# Patient Record
Sex: Female | Born: 1970 | Race: White | Hispanic: No | Marital: Married | Smoking: Former smoker
Health system: Southern US, Community
[De-identification: ages and names within clinical notes are randomized; demographics above are authoritative.]

## PROBLEM LIST (undated history)

## (undated) DIAGNOSIS — E669 Obesity, unspecified: Secondary | ICD-10-CM

## (undated) DIAGNOSIS — D219 Benign neoplasm of connective and other soft tissue, unspecified: Secondary | ICD-10-CM

## (undated) DIAGNOSIS — M1990 Unspecified osteoarthritis, unspecified site: Secondary | ICD-10-CM

## (undated) DIAGNOSIS — R0602 Shortness of breath: Secondary | ICD-10-CM

## (undated) DIAGNOSIS — K219 Gastro-esophageal reflux disease without esophagitis: Secondary | ICD-10-CM

## (undated) DIAGNOSIS — E039 Hypothyroidism, unspecified: Secondary | ICD-10-CM

## (undated) DIAGNOSIS — G4730 Sleep apnea, unspecified: Secondary | ICD-10-CM

## (undated) DIAGNOSIS — Z8489 Family history of other specified conditions: Secondary | ICD-10-CM

## (undated) DIAGNOSIS — I10 Essential (primary) hypertension: Secondary | ICD-10-CM

## (undated) DIAGNOSIS — F419 Anxiety disorder, unspecified: Secondary | ICD-10-CM

## (undated) DIAGNOSIS — G5600 Carpal tunnel syndrome, unspecified upper limb: Secondary | ICD-10-CM

## (undated) HISTORY — DX: Benign neoplasm of connective and other soft tissue, unspecified: D21.9

## (undated) HISTORY — PX: UPPER GASTROINTESTINAL ENDOSCOPY: SHX188

## (undated) HISTORY — PX: BREAST REDUCTION SURGERY: SHX8

## (undated) HISTORY — PX: COLONOSCOPY: SHX174

## (undated) HISTORY — PX: WISDOM TOOTH EXTRACTION: SHX21

## (undated) HISTORY — DX: Obesity, unspecified: E66.9

## (undated) HISTORY — PX: OVARIAN CYST REMOVAL: SHX89

---

## 1996-08-12 HISTORY — PX: REDUCTION MAMMAPLASTY: SUR839

## 2001-09-16 ENCOUNTER — Other Ambulatory Visit: Admission: RE | Admit: 2001-09-16 | Discharge: 2001-09-16 | Payer: Self-pay | Admitting: Obstetrics & Gynecology

## 2003-12-13 ENCOUNTER — Other Ambulatory Visit: Admission: RE | Admit: 2003-12-13 | Discharge: 2003-12-13 | Payer: Self-pay | Admitting: Obstetrics & Gynecology

## 2004-01-13 ENCOUNTER — Ambulatory Visit (HOSPITAL_COMMUNITY): Admission: RE | Admit: 2004-01-13 | Discharge: 2004-01-13 | Payer: Self-pay | Admitting: Obstetrics & Gynecology

## 2004-12-05 ENCOUNTER — Ambulatory Visit (HOSPITAL_COMMUNITY): Admission: RE | Admit: 2004-12-05 | Discharge: 2004-12-05 | Payer: Self-pay | Admitting: Obstetrics & Gynecology

## 2005-08-08 ENCOUNTER — Other Ambulatory Visit: Admission: RE | Admit: 2005-08-08 | Discharge: 2005-08-08 | Payer: Self-pay | Admitting: Obstetrics & Gynecology

## 2012-03-24 ENCOUNTER — Other Ambulatory Visit: Payer: Self-pay | Admitting: Orthopedic Surgery

## 2012-04-10 ENCOUNTER — Encounter (HOSPITAL_BASED_OUTPATIENT_CLINIC_OR_DEPARTMENT_OTHER): Payer: Self-pay | Admitting: *Deleted

## 2012-04-10 NOTE — Progress Notes (Signed)
Pt is obese--has sleep apnea-uses a c pap every night -to bring dos Has been having sob-tired all the time-seeing a cardiologist the day prior to her surgery for echo Will call for recent ekg and cxr- Will review with anesth If ok-will need [employer]

## 2012-04-10 NOTE — Progress Notes (Signed)
Called [facility] to have echo being done 04/14/12 to be sent asap-if cannot get-dr ossey said surgery may need to be r/s-alishia notified

## 2012-04-15 ENCOUNTER — Encounter (HOSPITAL_BASED_OUTPATIENT_CLINIC_OR_DEPARTMENT_OTHER): Payer: Self-pay | Admitting: *Deleted

## 2012-04-15 ENCOUNTER — Ambulatory Visit (HOSPITAL_BASED_OUTPATIENT_CLINIC_OR_DEPARTMENT_OTHER)
Admission: RE | Admit: 2012-04-15 | Discharge: 2012-04-15 | Disposition: A | Discharge status: Home / Self Care | Payer: BC Managed Care – HMO | Source: Ambulatory Visit | Attending: Orthopedic Surgery | Admitting: Orthopedic Surgery

## 2012-04-15 ENCOUNTER — Encounter (HOSPITAL_BASED_OUTPATIENT_CLINIC_OR_DEPARTMENT_OTHER): Payer: Self-pay | Admitting: Anesthesiology

## 2012-04-15 ENCOUNTER — Encounter (HOSPITAL_BASED_OUTPATIENT_CLINIC_OR_DEPARTMENT_OTHER): Payer: Self-pay | Admitting: Orthopedic Surgery

## 2012-04-15 ENCOUNTER — Ambulatory Visit (HOSPITAL_BASED_OUTPATIENT_CLINIC_OR_DEPARTMENT_OTHER): Payer: BC Managed Care – HMO | Admitting: Anesthesiology

## 2012-04-15 ENCOUNTER — Encounter (HOSPITAL_BASED_OUTPATIENT_CLINIC_OR_DEPARTMENT_OTHER)
Admission: RE | Disposition: A | Discharge status: Home / Self Care | Payer: Self-pay | Source: Ambulatory Visit | Attending: Orthopedic Surgery

## 2012-04-15 DIAGNOSIS — G4730 Sleep apnea, unspecified: Secondary | ICD-10-CM | POA: Insufficient documentation

## 2012-04-15 DIAGNOSIS — K219 Gastro-esophageal reflux disease without esophagitis: Secondary | ICD-10-CM | POA: Insufficient documentation

## 2012-04-15 DIAGNOSIS — I10 Essential (primary) hypertension: Secondary | ICD-10-CM | POA: Insufficient documentation

## 2012-04-15 DIAGNOSIS — G5600 Carpal tunnel syndrome, unspecified upper limb: Secondary | ICD-10-CM | POA: Insufficient documentation

## 2012-04-15 HISTORY — DX: Gastro-esophageal reflux disease without esophagitis: K21.9

## 2012-04-15 HISTORY — DX: Family history of other specified conditions: Z84.89

## 2012-04-15 HISTORY — PX: CARPAL TUNNEL RELEASE: SHX101

## 2012-04-15 HISTORY — DX: Sleep apnea, unspecified: G47.30

## 2012-04-15 HISTORY — DX: Carpal tunnel syndrome, unspecified upper limb: G56.00

## 2012-04-15 HISTORY — DX: Essential (primary) hypertension: I10

## 2012-04-15 HISTORY — DX: Shortness of breath: R06.02

## 2012-04-15 LAB — POCT I-STAT, CHEM 8
Calcium, Ion: 1.2 mmol/L (ref 1.12–1.23)
Glucose, Bld: 119 mg/dL — ABNORMAL HIGH (ref 70–99)
HCT: 42 % (ref 36.0–46.0)
Hemoglobin: 14.3 g/dL (ref 12.0–15.0)
Potassium: 5.2 mEq/L — ABNORMAL HIGH (ref 3.5–5.1)

## 2012-04-15 SURGERY — CARPAL TUNNEL RELEASE
Anesthesia: Regional | Site: Wrist | Laterality: Right | Wound class: Clean

## 2012-04-15 SURGERY — Surgical Case
Anesthesia: *Unknown

## 2012-04-15 MED ORDER — FENTANYL CITRATE 0.05 MG/ML IJ SOLN: 25.0000 ug | INTRAMUSCULAR | Status: DC | PRN

## 2012-04-15 MED ORDER — PROPOFOL 10 MG/ML IV EMUL
INTRAVENOUS | Status: DC | PRN
  Administered 2012-04-15: 75 ug/kg/min via INTRAVENOUS

## 2012-04-15 MED ORDER — MIDAZOLAM HCL 5 MG/5ML IJ SOLN
INTRAMUSCULAR | Status: DC | PRN
  Administered 2012-04-15: 0.5 mg via INTRAVENOUS
  Administered 2012-04-15: 1 mg via INTRAVENOUS

## 2012-04-15 MED ORDER — ONDANSETRON HCL 4 MG/2ML IJ SOLN
INTRAMUSCULAR | Status: DC | PRN
  Administered 2012-04-15: 4 mg via INTRAVENOUS

## 2012-04-15 MED ORDER — FENTANYL CITRATE 0.05 MG/ML IJ SOLN
INTRAMUSCULAR | Status: DC | PRN
  Administered 2012-04-15: 25 ug via INTRAVENOUS

## 2012-04-15 MED ORDER — DEXTROSE 5 % IV SOLN
3.0000 g | INTRAVENOUS | Status: AC
  Administered 2012-04-15: 2 g via INTRAVENOUS

## 2012-04-15 MED ORDER — HYDROCODONE-ACETAMINOPHEN 5-500 MG PO TABS: 1.0000 | ORAL_TABLET | ORAL | Status: AC | PRN

## 2012-04-15 MED ORDER — LIDOCAINE HCL (CARDIAC) 20 MG/ML IV SOLN
INTRAVENOUS | Status: DC | PRN
  Administered 2012-04-15: 30 mg via INTRAVENOUS

## 2012-04-15 MED ORDER — METOCLOPRAMIDE HCL 5 MG/ML IJ SOLN: 10.0000 mg | Freq: Once | INTRAMUSCULAR | Status: DC | PRN

## 2012-04-15 MED ORDER — LACTATED RINGERS IV SOLN
INTRAVENOUS | Status: DC
  Administered 2012-04-15: 10:00:00 via INTRAVENOUS

## 2012-04-15 MED ORDER — CHLORHEXIDINE GLUCONATE 4 % EX LIQD: 60.0000 mL | Freq: Once | CUTANEOUS | Status: DC

## 2012-04-15 MED ORDER — OXYCODONE HCL 5 MG/5ML PO SOLN: 5.0000 mg | Freq: Once | ORAL | Status: DC | PRN

## 2012-04-15 MED ORDER — OXYCODONE HCL 5 MG PO TABS: 5.0000 mg | ORAL_TABLET | Freq: Once | ORAL | Status: DC | PRN

## 2012-04-15 MED ORDER — BUPIVACAINE HCL (PF) 0.25 % IJ SOLN
INTRAMUSCULAR | Status: DC | PRN
  Administered 2012-04-15: 8 mL

## 2012-04-15 SURGICAL SUPPLY — 34 items
BANDAGE GAUZE ELAST BULKY 4 IN (GAUZE/BANDAGES/DRESSINGS) ×2 IMPLANT
BLADE SURG 15 STRL LF DISP TIS (BLADE) ×1 IMPLANT
BLADE SURG 15 STRL SS (BLADE) ×1
BNDG COHESIVE 3X5 TAN STRL LF (GAUZE/BANDAGES/DRESSINGS) ×2 IMPLANT
BNDG ESMARK 4X9 LF (GAUZE/BANDAGES/DRESSINGS) IMPLANT
CHLORAPREP W/TINT 26ML (MISCELLANEOUS) ×2 IMPLANT
CLOTH BEACON ORANGE TIMEOUT ST (SAFETY) ×2 IMPLANT
CORDS BIPOLAR (ELECTRODE) ×2 IMPLANT
COVER MAYO STAND STRL (DRAPES) ×2 IMPLANT
COVER TABLE BACK 60X90 (DRAPES) ×2 IMPLANT
CUFF TOURNIQUET SINGLE 18IN (TOURNIQUET CUFF) ×2 IMPLANT
DRAPE EXTREMITY T 121X128X90 (DRAPE) ×2 IMPLANT
DRAPE SURG 17X23 STRL (DRAPES) ×2 IMPLANT
DRSG KUZMA FLUFF (GAUZE/BANDAGES/DRESSINGS) ×2 IMPLANT
GAUZE XEROFORM 1X8 LF (GAUZE/BANDAGES/DRESSINGS) ×2 IMPLANT
GLOVE BIO SURGEON STRL SZ 6.5 (GLOVE) ×2 IMPLANT
GLOVE SURG ORTHO 8.0 STRL STRW (GLOVE) ×2 IMPLANT
GOWN BRE IMP PREV XXLGXLNG (GOWN DISPOSABLE) ×2 IMPLANT
GOWN PREVENTION PLUS XLARGE (GOWN DISPOSABLE) ×2 IMPLANT
NEEDLE 27GAX1X1/2 (NEEDLE) ×2 IMPLANT
NS IRRIG 1000ML POUR BTL (IV SOLUTION) ×2 IMPLANT
PACK BASIN DAY SURGERY FS (CUSTOM PROCEDURE TRAY) ×2 IMPLANT
PAD CAST 3X4 CTTN HI CHSV (CAST SUPPLIES) ×1 IMPLANT
PADDING CAST ABS 4INX4YD NS (CAST SUPPLIES)
PADDING CAST ABS COTTON 4X4 ST (CAST SUPPLIES) IMPLANT
PADDING CAST COTTON 3X4 STRL (CAST SUPPLIES) ×1
SPONGE GAUZE 4X4 12PLY (GAUZE/BANDAGES/DRESSINGS) ×2 IMPLANT
STOCKINETTE 4X48 STRL (DRAPES) ×2 IMPLANT
SUT VICRYL 4-0 PS2 18IN ABS (SUTURE) IMPLANT
SUT VICRYL RAPIDE 4/0 PS 2 (SUTURE) ×2 IMPLANT
SYR BULB 3OZ (MISCELLANEOUS) ×2 IMPLANT
SYR CONTROL 10ML LL (SYRINGE) ×2 IMPLANT
TOWEL OR 17X24 6PK STRL BLUE (TOWEL DISPOSABLE) ×2 IMPLANT
UNDERPAD 30X30 INCONTINENT (UNDERPADS AND DIAPERS) ×2 IMPLANT

## 2012-04-15 NOTE — H&P (Signed)
Jenna Pope is a 41 year-old right-hand dominant female who comes in complaining of pain in both hands, tenderness to the elbows, medial side with numbness and tingling to the thumb and index fingers.  She has no history of injury.  She is awakened three out of seven nights.  This has been going on for several years.  She states that she does a lot of typing.  She complains of a constant, moderate to severe, throbbing aching pain with a feeling of swelling, numbness and weakness. She states it is gradually getting worse. She has been using Advil and Aleve and wearing braces at night. She has no history of diabetes, thyroid problems, arthritis or gout. There is no history of injury to her hands or neck.  She states that right is equivalent to the left side.  She has had her nerve conductions done by Dr. Johna Roles revealing bilateral carpal tunnel syndrome with a motor delay of 4.7 on the left and 5.0 on the right, sensory delay of 3.1 bilaterally  ALLERGIES:   None.  MEDICATIONS:    Losartan, Protonix, Xanax.  SURGICAL HISTORY:    Ablation surgery in 2007, breast reduction in '98.  FAMILY MEDICAL HISTORY:    Positive for diabetes, heart disease, high blood pressure and arthritis.  SOCIAL HISTORY:    She smokes a pack a day and is advised to quit with the reasons behind this. She does not drink. She is married and works as a Research officer, trade union at [employer].  REVIEW OF SYSTEMS:    Positive for high blood pressure, shortness of breath, rash, sleep disorder secondary to sleep apnea otherwise negative 14 points. Jenna Pope is an 41 y.o. female.   Chief Complaint: CTS RT  HPI: see above  Past Medical History  Diagnosis Date  . Hypertension   . GERD (gastroesophageal reflux disease)   . Carpal tunnel syndrome   . Shortness of breath   . Sleep apnea     uses cpap since 2/13  . Family history of anesthesia complication     had an aunt cardiac arrest during a knee surgery    Past Surgical History    Procedure Date  . Upper gastrointestinal endoscopy   . Wisdom tooth extraction   . Ovarian cyst removal   . Breast reduction surgery     No family history on file. Social History:  reports that she has been smoking.  She does not have any smokeless tobacco history on file. She reports that she drinks alcohol. She reports that she does not use illicit drugs.  Allergies:  Allergies  Allergen Reactions  . Ace Inhibitors     cough    Medications Prior to Admission  Medication Sig Dispense Refill  . losartan-hydrochlorothiazide (HYZAAR) 50-12.5 MG per tablet Take 1 tablet by mouth daily.      . pantoprazole (PROTONIX) 40 MG tablet Take 40 mg by mouth daily.      Marland [employer] Specialty Vitamins Products (MAGNESIUM, AMINO ACID CHELATE,) 133 MG tablet Take 1 tablet by mouth 2 (two) times daily.        No results found for this or any previous visit (from the past 48 hour(s)).  No results found.   Pertinent items are noted in HPI.  Blood pressure 134/82, pulse 102, temperature 98.5 F (36.9 C), temperature source Oral, resp. rate 20, height 5' 2.5" (1.588 m), weight 258 lb (117.028 kg), SpO2 99.00%.  General appearance: alert, cooperative and appears stated age Head: Normocephalic, without obvious  abnormality Neck: no adenopathy Resp: clear to auscultation bilaterally Cardio: regular rate and rhythm, S1, S2 normal, no murmur, click, rub or gallop GI: soft, non-tender; bowel sounds normal; no masses,  no organomegaly Extremities: extremities normal, atraumatic, no cyanosis or edema Pulses:  L brachial 2+ R brachial 2+  L radial 2+ R radial 2+  L inguinal 2+ R inguinal 2+  L popliteal 2+ R popliteal 2+  L posterior tibial 2+ R posterior tibial 2+  L dorsalis pedis 2+ R dorsalis pedis 2+   Skin: Skin color, texture, turgor normal. No rashes or lesions Neurologic: Grossly normal Incision/Wound: na  Assessment/Plan CTS Rt  The pre, peri and postoperative course were discussed along  with the risks and complications.  The patient is aware there is no guarantee with the surgery, possibility of infection, recurrence, injury to arteries, nerves, tendons, incomplete relief of symptoms and dystrophy.  She would like to do the right side for release.  Nethan Caudillo R 04/15/2012, 10:22 AM

## 2012-04-15 NOTE — Transfer of Care (Signed)
Immediate Anesthesia Transfer of Care Note  Patient: Jenna Pope  Procedure(s) Performed: Procedure(s) (LRB) with comments: CARPAL TUNNEL RELEASE (Right)  Patient Location: PACU  Anesthesia Type: Bier block  Level of Consciousness: awake and alert   Airway & Oxygen Therapy: Patient Spontanous Breathing  Post-op Assessment: Report given to PACU RN and Post -op Vital signs reviewed and stable  Post vital signs: Reviewed and stable  Complications: No apparent anesthesia complications

## 2012-04-15 NOTE — Anesthesia Preprocedure Evaluation (Signed)
Anesthesia Evaluation  Patient identified by MRN, date of birth, ID band Patient awake    Reviewed: Allergy & Precautions, H&P , NPO status , Patient's Chart, lab work & pertinent test results, reviewed documented beta blocker date and time   Airway Mallampati: II TM Distance: >3 FB Neck ROM: full    Dental   Pulmonary shortness of breath and with exertion, sleep apnea ,  breath sounds clear to auscultation        Cardiovascular hypertension, On Medications Rhythm:regular     Neuro/Psych  Neuromuscular disease negative neurological ROS  negative psych ROS   GI/Hepatic negative GI ROS, Neg liver ROS, GERD-  Medicated and Controlled,  Endo/Other  negative endocrine ROS  Renal/GU negative Renal ROS  negative genitourinary   Musculoskeletal   Abdominal   Peds  Hematology negative hematology ROS (+)   Anesthesia Other Findings See surgeon's H&P   Reproductive/Obstetrics negative OB ROS                           Anesthesia Physical Anesthesia Plan  ASA: III  Anesthesia Plan: MAC and Bier Block   Post-op Pain Management:    Induction: Intravenous  Airway Management Planned: Simple Face Mask  Additional Equipment:   Intra-op Plan:   Post-operative Plan:   Informed Consent: I have reviewed the patients History and Physical, chart, labs and discussed the procedure including the risks, benefits and alternatives for the proposed anesthesia with the patient or authorized representative who has indicated his/her understanding and acceptance.   Dental Advisory Given  Plan Discussed with: CRNA and Surgeon  Anesthesia Plan Comments:         Anesthesia Quick Evaluation

## 2012-04-15 NOTE — Op Note (Signed)
Dictated number: [id number]

## 2012-04-15 NOTE — Anesthesia Postprocedure Evaluation (Signed)
Anesthesia Post Note  Patient: Jenna Pope  Procedure(s) Performed: Procedure(s) (LRB): CARPAL TUNNEL RELEASE (Right)  Anesthesia type: MAC  Patient location: PACU  Post pain: Pain level controlled  Post assessment: Patient's Cardiovascular Status Stable  Last Vitals:  Filed Vitals:   04/15/12 1150  BP: 132/89  Pulse: 94  Temp: 36.5 C  Resp: 16    Post vital signs: Reviewed and stable  Level of consciousness: alert  Complications: No apparent anesthesia complications

## 2012-04-15 NOTE — Brief Op Note (Signed)
04/15/2012  11:21 AM  PATIENT:  Coolidge Callas  41 y.o. female  PRE-OPERATIVE DIAGNOSIS:  right cts  POST-OPERATIVE DIAGNOSIS:  right cts  PROCEDURE:  Procedure(s) (LRB) with comments: CARPAL TUNNEL RELEASE (Right)  SURGEON:  Surgeon(s) and Role:    * Nicki Reaper, MD - Primary  PHYSICIAN ASSISTANT:   ASSISTANTS: none   ANESTHESIA:   local and regional  EBL:  Total I/O In: 700 [I.V.:700] Out: -   BLOOD ADMINISTERED:none  DRAINS: none   LOCAL MEDICATIONS USED:  MARCAINE     SPECIMEN:  No Specimen  DISPOSITION OF SPECIMEN:  N/A  COUNTS:  YES  TOURNIQUET:   Total Tourniquet Time Documented: Forearm (Right) - 23 minutes  DICTATION: .Other Dictation: Dictation Number [id number]  PLAN OF CARE: Discharge to home after PACU  PATIENT DISPOSITION:  PACU - hemodynamically stable.

## 2012-04-16 ENCOUNTER — Encounter (HOSPITAL_BASED_OUTPATIENT_CLINIC_OR_DEPARTMENT_OTHER): Payer: Self-pay | Admitting: Orthopedic Surgery

## 2012-04-16 NOTE — Op Note (Signed)
NAME:  SIMSStann Ore NO.:  [id number]  MEDICAL RECORD NO.:  [id number]  LOCATION:                                 FACILITY:  PHYSICIAN:  Cindee Salt, M.D.            DATE OF BIRTH:  DATE OF PROCEDURE:  04/15/2012 DATE OF DISCHARGE:                              OPERATIVE REPORT   PREOPERATIVE DIAGNOSIS:  Carpal tunnel syndrome, right hand.  POSTOPERATIVE DIAGNOSIS:  Carpal tunnel syndrome, right hand.  OPERATION:  Decompression of right median nerve.  SURGEON:  Cindee Salt, MD  ANESTHESIA:  Forearm-based IV regional with local infiltration.  ANESTHESIOLOGIST:  Janetta Hora. Gelene Mink, MD  HISTORY:  The patient is a 41 year old female with a history of carpal tunnel syndrome, EMG nerve conduction is positive.  This has not responded to conservative treatment.  She has elected to undergo surgical decompression.  Pre, peri, and postoperative course had been discussed along with risks and complications.  She is aware that there is no guarantee with the surgery; possibility of infection; recurrence of injury to arteries, nerves, tendons; incomplete relief of symptoms and dystrophy.  In the preoperative area, the patient is seen, the extremity marked by both the patient and surgeon, and antibiotic given.  PROCEDURE:  The patient was brought to the operating room.  A forearm- based IV regional anesthetic was carried out without difficulty.  She was prepped using ChloraPrep, supine position with the right arm free. A 3-minute dry time was allowed.  Time-out taken, confirming the patient and procedure.  A longitudinal incision was made in the palm, carried down through the subcutaneous tissue.  Bleeders were electrocauterized. Palmar fascia was split.  Superficial palmar arch was identified. Flexor tendon of the ring and little finger were identified to the ulnar side of the median nerve.  The carpal retinaculum was incised with sharp dissection.  Right angle and  Sewall retractor were placed between the skin and forearm fascia.  The fascia was released for approximately a 1.5 cm proximal to the wrist crease under direct vision.  Canal was explored.  Tenosynovial tissue was markedly thickened.  Air compression to the nerve was apparent.  No further lesions were identified.  The wound was irrigated.  The skin was then closed with interrupted 4-0 Vicryl Rapide sutures.  Local infiltration with 0.25% Marcaine without epinephrine was given, approximately 5 mL was used.  Sterile compressive dressing with fingers free was applied.  On deflation of the tourniquet, all fingers were immediately pinked.  She was taken to the recovery room for observation in satisfactory condition.          ______________________________ Cindee Salt, M.D.     GK/MEDQ  D:  04/15/2012  T:  04/16/2012  Job:  [id number]

## 2013-02-09 LAB — HM MAMMOGRAPHY: HM Mammogram: NORMAL

## 2013-03-15 ENCOUNTER — Encounter: Payer: Self-pay | Admitting: Family Medicine

## 2013-04-20 ENCOUNTER — Encounter: Payer: Self-pay | Admitting: Family Medicine

## 2013-04-20 ENCOUNTER — Ambulatory Visit (INDEPENDENT_AMBULATORY_CARE_PROVIDER_SITE_OTHER): Payer: BC Managed Care – HMO | Admitting: Family Medicine

## 2013-04-20 VITALS — BP 130/82 | HR 102 | Temp 98.7°F | Ht 61.5 in | Wt 260.8 lb

## 2013-04-20 DIAGNOSIS — Z9989 Dependence on other enabling machines and devices: Secondary | ICD-10-CM | POA: Insufficient documentation

## 2013-04-20 DIAGNOSIS — L259 Unspecified contact dermatitis, unspecified cause: Secondary | ICD-10-CM

## 2013-04-20 DIAGNOSIS — K219 Gastro-esophageal reflux disease without esophagitis: Secondary | ICD-10-CM | POA: Insufficient documentation

## 2013-04-20 DIAGNOSIS — F341 Dysthymic disorder: Secondary | ICD-10-CM

## 2013-04-20 DIAGNOSIS — I10 Essential (primary) hypertension: Secondary | ICD-10-CM | POA: Insufficient documentation

## 2013-04-20 DIAGNOSIS — L309 Dermatitis, unspecified: Secondary | ICD-10-CM

## 2013-04-20 DIAGNOSIS — G4733 Obstructive sleep apnea (adult) (pediatric): Secondary | ICD-10-CM

## 2013-04-20 DIAGNOSIS — F418 Other specified anxiety disorders: Secondary | ICD-10-CM

## 2013-04-20 LAB — CBC WITH DIFFERENTIAL/PLATELET
Basophils Relative: 0.3 % (ref 0.0–3.0)
Eosinophils Relative: 1.2 % (ref 0.0–5.0)
HCT: 42.5 % (ref 36.0–46.0)
Hemoglobin: 14.7 g/dL (ref 12.0–15.0)
Lymphs Abs: 2.2 10*3/uL (ref 0.7–4.0)
MCV: 89.5 fl (ref 78.0–100.0)
Monocytes Absolute: 0.5 10*3/uL (ref 0.1–1.0)
Monocytes Relative: 4.6 % (ref 3.0–12.0)
Neutro Abs: 8.6 10*3/uL — ABNORMAL HIGH (ref 1.4–7.7)
Platelets: 288 10*3/uL (ref 150.0–400.0)
WBC: 11.4 10*3/uL — ABNORMAL HIGH (ref 4.5–10.5)

## 2013-04-20 LAB — LIPID PANEL
Cholesterol: 160 mg/dL (ref 0–200)
HDL: 27.1 mg/dL — ABNORMAL LOW (ref >39.00)
Total CHOL/HDL Ratio: 6
Triglycerides: 177 mg/dL — ABNORMAL HIGH (ref 0.0–149.0)

## 2013-04-20 LAB — BASIC METABOLIC PANEL
BUN: 11 mg/dL (ref 6–23)
CO2: 25 mEq/L (ref 19–32)
Calcium: 9.3 mg/dL (ref 8.4–10.5)
Creatinine, Ser: 0.8 mg/dL (ref 0.4–1.2)
GFR: 90.08 mL/min (ref >60.00)
Glucose, Bld: 114 mg/dL — ABNORMAL HIGH (ref 70–99)

## 2013-04-20 LAB — HEPATIC FUNCTION PANEL
Albumin: 3.8 g/dL (ref 3.5–5.2)
Alkaline Phosphatase: 61 U/L (ref 39–117)
Total Protein: 7 g/dL (ref 6.0–8.3)

## 2013-04-20 LAB — TSH: TSH: 1.55 u[IU]/mL (ref 0.35–5.50)

## 2013-04-20 MED ORDER — VENLAFAXINE HCL ER 37.5 MG PO CP24: 37.5000 mg | ORAL_CAPSULE | Freq: Every day | ORAL | Status: DC

## 2013-04-20 NOTE — Assessment & Plan Note (Signed)
New to provider, chronic for pt.  sxs well controlled on Protonix but this causes low Mag (pt on supplement)

## 2013-04-20 NOTE — Progress Notes (Signed)
  Subjective:    Patient ID: Jenna Pope, female    DOB: 08-30-1970, 42 y.o.   MRN: [id number]  HPI New to establish.  Previous MD- Katrinka Blazing [facility])  GYNJennette Kettle  HTN- chronic problem, on Hyzaar daily.  BP was recently elevated at GYN.  No CP, SOB, HAs, visual changes  Depression- chronic problem, pt and husband recently lost their jobs.  Pt has found a new job at [employer].  Not currently taking meds for depression.  Taking xanax prn.  sxs have been 'building up'.    GERD- chronic problem, well controlled on Protonix but this causes low Mag levels.  OSA- currently on CPAP, Seeing Dr Precious Haws at [place] as needed.  Getting DME through [facility].   Obesity- pt reports gaining 'a lot of weight- 60-75 lbs in a year'.  Has gained 100 lbs since 2007.  Pt denies changes to diet.  Not exercising.  Started gaining weight w/ insertion of Mirena.  Has lost weight successfully w/ Weight Watchers.  Eczema- severe on palms and feet.  Currently using steroid ointment mixed w/ Eucerin.  Has Derm.   Review of Systems For ROS see HPI     Objective:   Physical Exam  Vitals reviewed. Constitutional: She is oriented to person, place, and time. She appears well-developed and well-nourished. No distress.  Morbidly obese  HENT:  Head: Normocephalic and atraumatic.  Eyes: Conjunctivae and EOM are normal. Pupils are equal, round, and reactive to light.  Neck: Normal range of motion. Neck supple. No thyromegaly present.  Cardiovascular: Normal rate, regular rhythm, normal heart sounds and intact distal pulses.   No murmur heard. Pulmonary/Chest: Effort normal and breath sounds normal. No respiratory distress.  Abdominal: Soft. She exhibits no distension. There is no tenderness.  Musculoskeletal: She exhibits no edema.  Lymphadenopathy:    She has no cervical adenopathy.  Neurological: She is alert and oriented to person, place, and time.  Skin: Skin is warm and dry.  Severe palmar eczema Acanthosis  nigricans Multiple skin tags on neck  Psychiatric: She has a normal mood and affect. Her behavior is normal.          Assessment & Plan:

## 2013-04-20 NOTE — Assessment & Plan Note (Signed)
New.  Pt's sxs are severe and seem to focus on her weight.  Will start daily SNRI to assist w/ energy, motivation, and mood.  Will follow closely.

## 2013-04-20 NOTE — Patient Instructions (Addendum)
Follow up in 1 month to recheck mood Start the Effexor daily We'll notify you of your lab results and make any changes if needed Restart Weight Watchers Schedule a follow up w/ your dermatologist Call with any questions or concerns Hang in there! Happy Early [place] Ouch!

## 2013-04-20 NOTE — Assessment & Plan Note (Signed)
New to provider, chronic for pt.  Compliant w/ CPAP per report.  Following w/ pulm ([facility]) prn.

## 2013-04-20 NOTE — Assessment & Plan Note (Signed)
New.  Pt has gained 100+ lbs in 7 yrs.  Reviewed importance of following a diet program- pt likes Weight Watchers.  Stressed importance of regular exercise but aware that until pt's depression improves, she will not have energy or motivation for this.  Briefly discussed lap band as option.  Stressed the importance of getting this under control to avoid additional co-morbidities.  Will follow closely.

## 2013-04-20 NOTE — Assessment & Plan Note (Signed)
New to provider, chronic for pt.  Using steroid/eucerin ointment.  Encouraged her to f/u w/ derm as this is not effective.  Pt expressed understanding and is in agreement w/ plan.

## 2013-04-20 NOTE — Assessment & Plan Note (Signed)
New to provider, chronic for pt.  Adequate control.  Asymptomatic.  Stressed importance of weight loss.  Check labs.  No anticipated med changes at this time.

## 2013-05-24 ENCOUNTER — Ambulatory Visit: Payer: BC Managed Care – HMO | Admitting: Family Medicine

## 2013-05-24 DIAGNOSIS — Z0289 Encounter for other administrative examinations: Secondary | ICD-10-CM

## 2013-05-27 ENCOUNTER — Ambulatory Visit (INDEPENDENT_AMBULATORY_CARE_PROVIDER_SITE_OTHER): Payer: BC Managed Care – HMO | Admitting: Family Medicine

## 2013-05-27 ENCOUNTER — Encounter: Payer: Self-pay | Admitting: Family Medicine

## 2013-05-27 VITALS — BP 126/70 | HR 110 | Temp 98.6°F | Resp 16 | Wt 258.5 lb

## 2013-05-27 DIAGNOSIS — F341 Dysthymic disorder: Secondary | ICD-10-CM

## 2013-05-27 DIAGNOSIS — R252 Cramp and spasm: Secondary | ICD-10-CM

## 2013-05-27 DIAGNOSIS — F418 Other specified anxiety disorders: Secondary | ICD-10-CM

## 2013-05-27 LAB — BASIC METABOLIC PANEL
BUN: 8 mg/dL (ref 6–23)
CO2: 28 mEq/L (ref 19–32)
Chloride: 100 mEq/L (ref 96–112)
Potassium: 3.6 mEq/L (ref 3.5–5.1)

## 2013-05-27 MED ORDER — VENLAFAXINE HCL ER 75 MG PO CP24: 75.0000 mg | ORAL_CAPSULE | Freq: Every day | ORAL | Status: DC

## 2013-05-27 NOTE — Patient Instructions (Signed)
Follow up in 6-8 weeks to recheck mood Take 2 of the Effexor that you have at home and 1 of the new prescription We'll notify you of your lab results Call with any questions or concerns Keep up the good work!  You can do this!!

## 2013-05-27 NOTE — Assessment & Plan Note (Signed)
Improved since starting Effexor but still not at goal.  Will increase to 75mg  daily and follow.  Pt expressed understanding and is in agreement w/ plan.

## 2013-05-27 NOTE — Progress Notes (Signed)
  Subjective:    Patient ID: Jenna Pope, female    DOB: [birth year], 42 y.o.   MRN: [id number]  HPI Depression/Anxiety- pt started on Effexor at last visit.  Feels anxiety has improved somewhat.  Decrease in # of panic attacks.  Still feeling 'a little down'.  Still feeling fatigued.  Denies SI/HI.  Sleeping better.  Leg cramps- pt reports Charley horse type cramps in legs bilaterally.  On Hyzaar daily.  Cramps will occur both at night and during the day.  Limited water intake.   Review of Systems For ROS see HPI     Objective:   Physical Exam  Vitals reviewed. Constitutional: She is oriented to person, place, and time. She appears well-developed and well-nourished. No distress.  Cardiovascular: Normal rate, regular rhythm and intact distal pulses.   Pulmonary/Chest: Effort normal and breath sounds normal. No respiratory distress. She has no wheezes. She has no rales.  Musculoskeletal: She exhibits no edema and no tenderness.  Neurological: She is alert and oriented to person, place, and time.  Skin: Skin is warm and dry.  Psychiatric: She has a normal mood and affect. Her behavior is normal. Thought content normal.          Assessment & Plan:

## 2013-05-27 NOTE — Assessment & Plan Note (Signed)
New.  Suspect component of dehydration.  Will check K+ due to pt's use of Hyzaar.  Encouraged increased dietary intake and increased water intake.  Will follow.

## 2013-05-28 ENCOUNTER — Encounter: Payer: Self-pay | Admitting: General Practice

## 2013-07-16 ENCOUNTER — Encounter: Payer: Self-pay | Admitting: Family Medicine

## 2013-07-16 ENCOUNTER — Ambulatory Visit (INDEPENDENT_AMBULATORY_CARE_PROVIDER_SITE_OTHER): Payer: BC Managed Care – HMO | Admitting: Family Medicine

## 2013-07-16 VITALS — BP 128/74 | HR 113 | Temp 98.2°F | Resp 16 | Wt 262.5 lb

## 2013-07-16 DIAGNOSIS — I10 Essential (primary) hypertension: Secondary | ICD-10-CM

## 2013-07-16 DIAGNOSIS — F418 Other specified anxiety disorders: Secondary | ICD-10-CM

## 2013-07-16 DIAGNOSIS — F341 Dysthymic disorder: Secondary | ICD-10-CM

## 2013-07-16 MED ORDER — VENLAFAXINE HCL ER 150 MG PO CP24: 150.0000 mg | ORAL_CAPSULE | Freq: Every day | ORAL | Status: DC

## 2013-07-16 MED ORDER — LOSARTAN POTASSIUM 50 MG PO TABS: 50.0000 mg | ORAL_TABLET | Freq: Every day | ORAL | Status: DC

## 2013-07-16 MED ORDER — HYDROCHLOROTHIAZIDE 12.5 MG PO TABS: 12.5000 mg | ORAL_TABLET | Freq: Every day | ORAL | Status: DC

## 2013-07-16 NOTE — Patient Instructions (Signed)
Schedule your complete physical in 6 months Increase the Effexor to 150mg - 2 of what you have and 1 of the new script Keep up the good work!  You look great! Call with any questions or concerns Happy Holidays!!!

## 2013-07-16 NOTE — Assessment & Plan Note (Signed)
Chronic problem.  Well controlled today.  Asymptomatic.  Pt has broken her medication into its component parts and feels BP has been better controlled since doing so.  Will follow.

## 2013-07-16 NOTE — Assessment & Plan Note (Signed)
Improving but still not well controlled.  Increase Effexor to 150mg  daily and monitor for symptom improvement.  Will follow.

## 2013-07-16 NOTE — Progress Notes (Signed)
   Subjective:    Patient ID: Jenna Pope, female    DOB: 03/20/1971, 42 y.o.   MRN: [id number]  HPI Pre visit review using our clinic review tool, if applicable. No additional management support is needed unless otherwise documented below in the visit note.  Depression- chronic problem, Effexor was increased to 75mg  daily.  Having panic attacks twice weekly rather than twice a day.  Energy has improved but still lacking.  Reports feeling better.  HTN- pt reports pharmacist at work separated the 2 pills and she is now taking a Losartan and HCTZ individually rather than in combo.  This has improved BP.  No CP, SOB, HAs, visual changes, edema.   Review of Systems For ROS see HPI     Objective:   Physical Exam  Vitals reviewed. Constitutional: She is oriented to person, place, and time. She appears well-developed and well-nourished. No distress.  HENT:  Head: Normocephalic and atraumatic.  Eyes: Conjunctivae and EOM are normal. Pupils are equal, round, and reactive to light.  Neck: Normal range of motion. Neck supple. No thyromegaly present.  Cardiovascular: Normal rate, regular rhythm, normal heart sounds and intact distal pulses.   No murmur heard. Pulmonary/Chest: Effort normal and breath sounds normal. No respiratory distress.  Abdominal: Soft. She exhibits no distension. There is no tenderness.  Musculoskeletal: She exhibits no edema.  Lymphadenopathy:    She has no cervical adenopathy.  Neurological: She is alert and oriented to person, place, and time.  Skin: Skin is warm and dry.  Psychiatric: She has a normal mood and affect. Her behavior is normal.          Assessment & Plan:

## 2013-08-12 HISTORY — PX: OTHER SURGICAL HISTORY: SHX169

## 2013-08-27 ENCOUNTER — Encounter: Payer: Self-pay | Admitting: Family Medicine

## 2013-08-27 ENCOUNTER — Ambulatory Visit (INDEPENDENT_AMBULATORY_CARE_PROVIDER_SITE_OTHER): Payer: BC Managed Care – HMO | Admitting: Family Medicine

## 2013-08-27 VITALS — BP 130/80 | HR 118 | Temp 98.4°F | Resp 17 | Wt 259.0 lb

## 2013-08-27 DIAGNOSIS — J320 Chronic maxillary sinusitis: Secondary | ICD-10-CM

## 2013-08-27 DIAGNOSIS — J209 Acute bronchitis, unspecified: Secondary | ICD-10-CM

## 2013-08-27 MED ORDER — PROMETHAZINE-DM 6.25-15 MG/5ML PO SYRP: 5.0000 mL | ORAL_SOLUTION | Freq: Four times a day (QID) | ORAL | Status: DC | PRN

## 2013-08-27 MED ORDER — IPRATROPIUM-ALBUTEROL 0.5-2.5 (3) MG/3ML IN SOLN
3.0000 mL | Freq: Once | RESPIRATORY_TRACT | Status: AC
  Administered 2013-08-27: 3 mL via RESPIRATORY_TRACT

## 2013-08-27 MED ORDER — AMOXICILLIN 875 MG PO TABS: 875.0000 mg | ORAL_TABLET | Freq: Two times a day (BID) | ORAL | Status: DC

## 2013-08-27 MED ORDER — IPRATROPIUM-ALBUTEROL 0.5-2.5 (3) MG/3ML IN SOLN: 3.0000 mL | RESPIRATORY_TRACT | Status: DC

## 2013-08-27 NOTE — Progress Notes (Signed)
   Subjective:    Patient ID: Jenna Pope, female    DOB: 01-22-1971, 43 y.o.   MRN: [id number]  HPI URI- sxs started Tuesday, 'really feeling crappy'.  Cough is constant, intermittently productive.  + nasal congestion.  No sinus pain/pressure.  + body aches.  No fevers.  Minimal sore throat.  No ear pain.  + sick contacts.   Review of Systems For ROS see HPI     Objective:   Physical Exam  Vitals reviewed. Constitutional: She appears well-developed and well-nourished. No distress.  HENT:  Head: Normocephalic and atraumatic.  Right Ear: Tympanic membrane normal.  Left Ear: Tympanic membrane normal.  Nose: Mucosal edema and rhinorrhea present. Right sinus exhibits maxillary sinus tenderness. Right sinus exhibits no frontal sinus tenderness. Left sinus exhibits maxillary sinus tenderness. Left sinus exhibits no frontal sinus tenderness.  Mouth/Throat: Uvula is midline and mucous membranes are normal. Posterior oropharyngeal erythema present. No oropharyngeal exudate.  Eyes: Conjunctivae and EOM are normal. Pupils are equal, round, and reactive to light.  Neck: Normal range of motion. Neck supple.  Cardiovascular: Normal rate, regular rhythm and normal heart sounds.   Pulmonary/Chest: Effort normal. No respiratory distress. She has wheezes (scattered expiratory wheezes, cleared s/p duoneb).  Hacking cough  Lymphadenopathy:    She has no cervical adenopathy.          Assessment & Plan:

## 2013-08-27 NOTE — Assessment & Plan Note (Signed)
New.  Start amox.  Wheezing improved s/p neb tx.  Cough meds prn.  Reviewed supportive care and red flags that should prompt return.  Pt expressed understanding and is in agreement w/ plan.

## 2013-08-27 NOTE — Patient Instructions (Signed)
Follow up as needed Start the Amoxicillin twice daily for the sinuses Use the cough syrup as needed- will cause drowsiness Use Mucinex DM for daytime cough Drink plenty of fluids REST! Hang in there!!!

## 2013-08-27 NOTE — Progress Notes (Signed)
Pre visit review using our clinic review tool, if applicable. No additional management support is needed unless otherwise documented below in the visit note. 

## 2013-08-27 NOTE — Assessment & Plan Note (Signed)
New.  Pt's sxs and PE consistent w/ infxn.  Start abx.  Reviewed supportive care and red flags that should prompt return.  Pt expressed understanding and is in agreement w/ plan.  

## 2013-10-20 ENCOUNTER — Telehealth: Payer: Self-pay | Admitting: Family Medicine

## 2013-10-20 NOTE — Telephone Encounter (Signed)
Called pt and left a detailed message to return call to inform.

## 2013-10-20 NOTE — Telephone Encounter (Signed)
Pt needs to be more specific as there are various options for the isogenix vitamins.  Will write script if I know what I am writing it for.

## 2013-10-20 NOTE — Telephone Encounter (Signed)
Please advise if pt needs an appt. Last seen 08/2013.

## 2013-10-20 NOTE — Telephone Encounter (Signed)
Patient called and wanted to discuss with dr Birdie Riddle about prescribing her Isagenix vitamins. She knows that she can probably get it over the counter but she needs a written rx to use her flex spending card.  Please advise

## 2013-10-22 NOTE — Telephone Encounter (Signed)
Cannot reach pt by phone, encounter closed until pt returns call.

## 2013-11-18 ENCOUNTER — Encounter: Payer: Self-pay | Admitting: Family Medicine

## 2013-11-18 ENCOUNTER — Ambulatory Visit (INDEPENDENT_AMBULATORY_CARE_PROVIDER_SITE_OTHER): Payer: BC Managed Care – HMO | Admitting: Family Medicine

## 2013-11-18 VITALS — BP 124/70 | HR 112 | Temp 98.4°F | Resp 16 | Wt 266.4 lb

## 2013-11-18 DIAGNOSIS — M545 Low back pain, unspecified: Secondary | ICD-10-CM

## 2013-11-18 DIAGNOSIS — M549 Dorsalgia, unspecified: Secondary | ICD-10-CM | POA: Insufficient documentation

## 2013-11-18 MED ORDER — CYCLOBENZAPRINE HCL 10 MG PO TABS
10.0000 mg | ORAL_TABLET | Freq: Every day | ORAL | Status: DC
Start: 2013-11-18 — End: 2014-09-06

## 2013-11-18 MED ORDER — IBUPROFEN 800 MG PO TABS: 800.0000 mg | ORAL_TABLET | Freq: Three times a day (TID) | ORAL | Status: DC | PRN

## 2013-11-18 NOTE — Progress Notes (Signed)
   Subjective:    Patient ID: Jenna Pope, female    DOB: 1970-11-09, 43 y.o.   MRN: [id number]  HPI LBP- sxs started ~1 week ago.  Pt having most pain in center of LS spine- described as a 'pulling' when rising from a seated position.  Pain w/ prolonged sitting.  No known injury.  Pt has been unable to walk the long distance from car to work in the last week.  Legs are not numb but described as heavy.  No bowel or bladder incontinence.  Taking ibuprofen 800mg  w/ good relief.  Sat on heating pad yesterday w/ good relief.   Review of Systems For ROS see HPI     Objective:   Physical Exam  Vitals reviewed. Constitutional: She appears well-developed and well-nourished.  Obviously uncomfortable  Cardiovascular: Intact distal pulses.   Musculoskeletal:  + TTP over lumbar paraspinals, pain w/ extension>flexion  Neurological: She has normal reflexes. No cranial nerve deficit. Coordination normal.  (-) SLR bilaterally  Skin: Skin is warm and dry.  Psychiatric: She has a normal mood and affect. Her behavior is normal.          Assessment & Plan:

## 2013-11-18 NOTE — Patient Instructions (Signed)
Follow up as needed Start the Ibuprofen twice daily- take w/ food Flexeril at night for spasm- will cause drowsiness Heating pad! If no improvement in the next 10-14 days, please call me! Call with any questions or concerns Hang in there!!

## 2013-11-18 NOTE — Progress Notes (Signed)
Pre visit review using our clinic review tool, if applicable. No additional management support is needed unless otherwise documented below in the visit note. 

## 2013-11-21 NOTE — Assessment & Plan Note (Signed)
New.  Start scheduled NSAIDs and flexeril prn.  Heat.  Reviewed supportive care and red flags that should prompt return.  Pt expressed understanding and is in agreement w/ plan.

## 2013-12-29 ENCOUNTER — Telehealth: Payer: Self-pay | Admitting: *Deleted

## 2013-12-29 NOTE — Telephone Encounter (Signed)
Caller name:  Shemika Relation to pt:  self Call back number:  [phone] Pharmacy:  [facility] [address]  Reason for call:   Pt called to request prescription for ALPRAZolam (XANAX) 0.5 MG tablet.  It is listed on her current medication list, but it has not been prescribed by [facility].  Was prescribed by last PCP.  She only takes as needed, and has a few left from prescription she had filled 05/21/2013, #90, no refills.  Instructions were to take 1 by mouth 3 times a day as needed.  She does not take 3 times a day and does not take every day.  Please advise.  bw

## 2013-12-29 NOTE — Telephone Encounter (Signed)
[facility] for #90, no refill.  Needs controlled substance agreement

## 2013-12-30 MED ORDER — ALPRAZOLAM 0.5 MG PO TABS: 0.5000 mg | ORAL_TABLET | Freq: Two times a day (BID) | ORAL | Status: DC | PRN

## 2013-12-30 NOTE — Telephone Encounter (Signed)
Med filled, pt notified to come to office to pick up. [place] printed and attached to rx to complete.

## 2013-12-31 ENCOUNTER — Other Ambulatory Visit: Payer: Self-pay | Admitting: Obstetrics & Gynecology

## 2014-02-28 ENCOUNTER — Other Ambulatory Visit: Payer: Self-pay | Admitting: Family Medicine

## 2014-02-28 NOTE — Telephone Encounter (Signed)
Med filled.  

## 2014-03-30 ENCOUNTER — Ambulatory Visit (HOSPITAL_BASED_OUTPATIENT_CLINIC_OR_DEPARTMENT_OTHER)
Admission: RE | Admit: 2014-03-30 | Discharge: 2014-03-30 | Disposition: A | Discharge status: Home / Self Care | Payer: BC Managed Care – HMO | Source: Ambulatory Visit | Attending: Medical | Admitting: Medical

## 2014-03-30 ENCOUNTER — Encounter: Payer: Self-pay | Admitting: Medical

## 2014-03-30 ENCOUNTER — Telehealth: Payer: Self-pay | Admitting: Medical

## 2014-03-30 ENCOUNTER — Ambulatory Visit (INDEPENDENT_AMBULATORY_CARE_PROVIDER_SITE_OTHER): Payer: BC Managed Care – HMO | Admitting: Medical

## 2014-03-30 VITALS — BP 112/70 | HR 110 | Temp 97.9°F | Wt 258.0 lb

## 2014-03-30 DIAGNOSIS — R1084 Generalized abdominal pain: Secondary | ICD-10-CM

## 2014-03-30 DIAGNOSIS — K5900 Constipation, unspecified: Secondary | ICD-10-CM

## 2014-03-30 DIAGNOSIS — R109 Unspecified abdominal pain: Secondary | ICD-10-CM | POA: Insufficient documentation

## 2014-03-30 DIAGNOSIS — E876 Hypokalemia: Secondary | ICD-10-CM

## 2014-03-30 DIAGNOSIS — D72829 Elevated white blood cell count, unspecified: Secondary | ICD-10-CM

## 2014-03-30 LAB — COMPREHENSIVE METABOLIC PANEL
ALBUMIN: 3 g/dL — AB (ref 3.5–5.2)
ALK PHOS: 66 U/L (ref 39–117)
ALT: 53 U/L — AB (ref 0–35)
AST: 50 U/L — AB (ref 0–37)
BILIRUBIN TOTAL: 0.6 mg/dL (ref 0.2–1.2)
BUN: 10 mg/dL (ref 6–23)
CO2: 30 mEq/L (ref 19–32)
Calcium: 8.7 mg/dL (ref 8.4–10.5)
Chloride: 94 mEq/L — ABNORMAL LOW (ref 96–112)
Creatinine, Ser: 0.9 mg/dL (ref 0.4–1.2)
GFR: 69.1 mL/min (ref >60.00)
GLUCOSE: 69 mg/dL — AB (ref 70–99)
POTASSIUM: 2.4 meq/L — AB (ref 3.5–5.1)
SODIUM: 135 meq/L (ref 135–145)
TOTAL PROTEIN: 7.8 g/dL (ref 6.0–8.3)

## 2014-03-30 LAB — CBC WITH DIFFERENTIAL/PLATELET
BASOS ABS: 0 10*3/uL (ref 0.0–0.1)
Basophils Relative: 0.2 % (ref 0.0–3.0)
EOS PCT: 0.2 % (ref 0.0–5.0)
Eosinophils Absolute: 0 10*3/uL (ref 0.0–0.7)
HCT: 36.7 % (ref 36.0–46.0)
Hemoglobin: 12.4 g/dL (ref 12.0–15.0)
Lymphocytes Relative: 12.5 % (ref 12.0–46.0)
Lymphs Abs: 1.9 10*3/uL (ref 0.7–4.0)
MCHC: 33.7 g/dL (ref 30.0–36.0)
MCV: 90.6 fl (ref 78.0–100.0)
MONO ABS: 1.5 10*3/uL — AB (ref 0.1–1.0)
Monocytes Relative: 9.9 % (ref 3.0–12.0)
NEUTROS PCT: 77.2 % — AB (ref 43.0–77.0)
Neutro Abs: 11.9 10*3/uL — ABNORMAL HIGH (ref 1.4–7.7)
PLATELETS: 265 10*3/uL (ref 150.0–400.0)
RBC: 4.05 Mil/uL (ref 3.87–5.11)
RDW: 13.1 % (ref 11.5–15.5)
WBC: 15.4 10*3/uL — AB (ref 4.0–10.5)

## 2014-03-30 NOTE — Progress Notes (Signed)
   Subjective:    Patient ID: Jenna Pope, female    DOB: 1971/06/23, 43 y.o.   MRN: [id number]  HPI   Pt states she states she had some initial problems with difficulty passing stool last week. Last Wednesday to this Saturday no bm. Then on Saturday small but more normal bm. Then on Sunday, Monday, and Tuesday again no bm. But today she did have a loose watery stool/diarrhea one time. No blood in stool. No dark black stools. No history of abdominal surgeries. Pt has not history of ibs. Only reflux. Pt mom has ibs. LMP-2 wks ago and was a normal cycle and came expected time. Pt does have occasinal flatulence.  Pt does have mild ha.Pt no chills, no fever. She always sweats.  Pt had no significant surgeries     Review of Systems  Constitutional: Negative for fever, chills and fatigue.       Always sweats some.  HENT: Negative.   Respiratory: Negative for cough, choking, shortness of breath and wheezing.   Cardiovascular: Negative for chest pain and palpitations.  Gastrointestinal: Positive for abdominal pain and constipation. Negative for nausea, vomiting, blood in stool and abdominal distention.       Mild bloated sensation. Faint diffuse pain. Constipated then one large loose stool.  Endocrine: Negative for polydipsia, polyphagia and polyuria.  Genitourinary: Negative.   Musculoskeletal: Negative.   Skin: Negative.   Hematological: Negative for adenopathy. Does not bruise/bleed easily.       Objective:   Physical Exam  General Appearance- Not in acute distress.  HEENT Eyes- Scleraeral/Conjuntiva-bilat- Not Yellow. Mouth & Throat- Normal.  Chest and Lung Exam Auscultation: Breath sounds:-Normal. Adventitious sounds:- No Adventitious sounds.  Cardiovascular Auscultation:Rythm - Regular. Heart Sounds -Normal heart sounds.  Abdomen Inspection:-Inspection Normal.  Palpation: Palpation abdomen reveal- faint diffuse tender, No Rebound tenderness, No rigidity(Guarding) and  No Palpable abdominal masses.  Liver:-Normal.  Spleen:- Normal.   Rectal Anorectal Exam: Stool - Hemoccult of stool/mucous is Heme Negative. External - normal external exam. Internal - normal sphincter tone. No rectal mass.       Assessment & Plan:  Cbc, CMP, abdomen series, (stool studies if any persisting diarrhea reoccurs).  Stool card in office neg  for blood. Metamucil otc.  Will follow labs and xray.

## 2014-03-30 NOTE — Assessment & Plan Note (Signed)
Faint but with 3 days constipation hx did some labs as well as abdomen series. Non specific pattern. So I only advised metamucil otc 1 rounded table spoon in 8 oz water tid. Eat healthy diet. If diarrhea becomes pattern then stool panel. If constipation reoccuring let me knw.

## 2014-03-30 NOTE — Assessment & Plan Note (Signed)
Resolved and will follow. Considering pt may have ibs as her mother does. Follow labs and notify her as well.

## 2014-03-30 NOTE — Telephone Encounter (Signed)
I left a message with pt since she did not pick up. I advised that her k is low. I will call in k-dur 20 meq tabs 1 tab po q day. Also eat banana every day. Repeat bmp in one week. I instructed pt to call tomorrow to schedule that appointment for next Friday. I will put in order today.

## 2014-03-30 NOTE — Progress Notes (Signed)
Pre visit review using our clinic review tool, if applicable. No additional management support is needed unless otherwise documented below in the visit note. 

## 2014-03-30 NOTE — Patient Instructions (Signed)
For your predominant constipation with only one loose stool/diarrhea today.(with faint abdominal pain.) I do want to do labs today and get abdominal series. When xray result back will advise on treatment. If you start with predominant diarrhea then will get stool pane studiesl. Follow up in 7 days or as needed.

## 2014-03-31 MED ORDER — CIPROFLOXACIN HCL 500 MG PO TABS: 500.0000 mg | ORAL_TABLET | Freq: Two times a day (BID) | ORAL | Status: DC

## 2014-03-31 MED ORDER — POTASSIUM CHLORIDE CRYS ER 20 MEQ PO TBCR: 20.0000 meq | EXTENDED_RELEASE_TABLET | Freq: Every day | ORAL | Status: DC

## 2014-03-31 NOTE — Addendum Note (Signed)
Addended by: Peggyann Shoals on: 03/31/2014 08:21 AM   Modules accepted: Orders

## 2014-03-31 NOTE — Addendum Note (Signed)
Addended by: Peggyann Shoals on: 03/31/2014 08:19 AM   Modules accepted: Orders

## 2014-03-31 NOTE — Telephone Encounter (Signed)
Caller name: Arian Relation to pt: self  Call back number: [phone] Pharmacy: [facility] [phone]   Reason for call: pt is inquiring about Potassium rx.

## 2014-03-31 NOTE — Telephone Encounter (Signed)
rx sent to [facility]

## 2014-04-08 ENCOUNTER — Encounter: Payer: Self-pay | Admitting: Medical

## 2014-04-08 ENCOUNTER — Ambulatory Visit (INDEPENDENT_AMBULATORY_CARE_PROVIDER_SITE_OTHER): Payer: BC Managed Care – PPO | Admitting: Medical

## 2014-04-08 VITALS — BP 125/70 | HR 72 | Temp 98.2°F | Wt 259.0 lb

## 2014-04-08 DIAGNOSIS — R1084 Generalized abdominal pain: Secondary | ICD-10-CM

## 2014-04-08 DIAGNOSIS — D72823 Leukemoid reaction: Secondary | ICD-10-CM | POA: Insufficient documentation

## 2014-04-08 DIAGNOSIS — R109 Unspecified abdominal pain: Secondary | ICD-10-CM

## 2014-04-08 DIAGNOSIS — E876 Hypokalemia: Secondary | ICD-10-CM | POA: Insufficient documentation

## 2014-04-08 DIAGNOSIS — D72829 Elevated white blood cell count, unspecified: Secondary | ICD-10-CM

## 2014-04-08 LAB — CBC WITH DIFFERENTIAL/PLATELET
BASOS ABS: 0 10*3/uL (ref 0.0–0.1)
Basophils Relative: 0.2 % (ref 0.0–3.0)
EOS ABS: 0.1 10*3/uL (ref 0.0–0.7)
Eosinophils Relative: 1 % (ref 0.0–5.0)
HCT: 38.8 % (ref 36.0–46.0)
Hemoglobin: 12.8 g/dL (ref 12.0–15.0)
LYMPHS PCT: 25.3 % (ref 12.0–46.0)
Lymphs Abs: 3.2 10*3/uL (ref 0.7–4.0)
MCHC: 33 g/dL (ref 30.0–36.0)
MCV: 91.3 fl (ref 78.0–100.0)
MONO ABS: 0.5 10*3/uL (ref 0.1–1.0)
Monocytes Relative: 4 % (ref 3.0–12.0)
NEUTROS PCT: 69.5 % (ref 43.0–77.0)
Neutro Abs: 8.7 10*3/uL — ABNORMAL HIGH (ref 1.4–7.7)
Platelets: 486 10*3/uL — ABNORMAL HIGH (ref 150.0–400.0)
RBC: 4.24 Mil/uL (ref 3.87–5.11)
RDW: 13.6 % (ref 11.5–15.5)
WBC: 12.6 10*3/uL — ABNORMAL HIGH (ref 4.0–10.5)

## 2014-04-08 LAB — BASIC METABOLIC PANEL WITH GFR
BUN: 11 mg/dL (ref 6–23)
CALCIUM: 9.2 mg/dL (ref 8.4–10.5)
CO2: 25 meq/L (ref 19–32)
CREATININE: 0.77 mg/dL (ref 0.50–1.10)
Chloride: 104 mEq/L (ref 96–112)
GFR, Est Non African American: >89 mL/min
GLUCOSE: 88 mg/dL (ref 70–99)
Potassium: 4.2 mEq/L (ref 3.5–5.3)
Sodium: 140 mEq/L (ref 135–145)

## 2014-04-08 NOTE — Assessment & Plan Note (Signed)
On k-dur and banana q day. Repeat today. Then will decide on further tx. Maybe hctz related and recent diarrhea related. When level back determine further management.

## 2014-04-08 NOTE — Assessment & Plan Note (Signed)
Resolved now. Stool now formed and regular. I think may have had some IBS.Marland Kitchen But with white count elevation maybe bacterial component. She seemed to improve with cipro. Repeating wbc today.

## 2014-04-08 NOTE — Progress Notes (Signed)
Pre visit review using our clinic review tool, if applicable. No additional management support is needed unless otherwise documented below in the visit note. 

## 2014-04-08 NOTE — Progress Notes (Signed)
   Subjective:    Patient ID: Jenna Pope, female    DOB: [birth year], 43 y.o.   MRN: [id number]  HPI  Pt k was 2.4. Pt in past states some occasional muscle cramps in past 6 months. No palpitations. Pt has been on 20 meq of k q day. And eating a banana a day. Not recent cramps.  Pt had recent elevated wbc. At that time she was having some occasional loose stools. Now her stools are better with metamucil. I called in antibiotics and she took for 5 days. She feels better. No abdominal pain. On ROS for other cause of wbc today show no suspicous infections.     Review of Systems  Constitutional: Negative for fever, chills and fatigue.  HENT: Negative.   Respiratory: Negative for cough, chest tightness, shortness of breath and wheezing.   Cardiovascular: Negative for chest pain and palpitations.  Gastrointestinal: Negative for nausea, vomiting, abdominal pain, diarrhea, constipation, blood in stool, abdominal distention and anal bleeding.  Genitourinary: Negative.   Musculoskeletal: Negative.   Neurological: Negative.   Hematological: Negative for adenopathy. Does not bruise/bleed easily.  Psychiatric/Behavioral: Negative.     General  Mental Status- Alert. Orientation- Orientation x 4.   Skin General:- Normal. Moisture- Dry. Temperature- Warm.  HEENT No sinus pressure. Normal canals, normal tm. Posterior pharynx normal. Neck Neck- Supple.  Heart Ausculation-RRR  Lungs Ausculation- Clear, even, unlabored bilaterlly.    Abdomen Palpation: Palpation- Non Tender, No Rebound tenderness, No Rigidity(guarding), No Palpable abdominal masses and No jar tenderness. No suprapubic tenderness. Liver:-Normal. Spleen:- Normal. Other Characteristics- No Costovertebral angle tenderness- Left or Costovertebral angle tenderness- Right.  Auscultation: Auscultation of the abdomen reveals- Bowel Sounds normal.     Objective:   Physical Exam See above exam.        Assessment & Plan:

## 2014-04-08 NOTE — Patient Instructions (Addendum)
I will check your k level today. Continue with k-dur tab and banana every day until notified on the results(will advise on further plan then). For your elevated wbc we will repeat that today to see if it has come down. Low k may have been related to your GI illness and use of diuretic. Your elevated wbc may have been from the gastrointestinal infection.(Keep in mind you may have IBS as well). Follow up to be determined when labs are in. Others come in as needed.  Cbc and bmp order already placed in chart.

## 2014-04-08 NOTE — Assessment & Plan Note (Signed)
Repeat cbc today. Follow wbc and see if resolved post cipro tx for recent GI illness.

## 2014-04-08 NOTE — Addendum Note (Signed)
Addended by: Modena Morrow D on: 04/08/2014 10:54 AM   Modules accepted: Orders

## 2014-04-11 ENCOUNTER — Encounter: Payer: Self-pay | Admitting: *Deleted

## 2014-07-01 LAB — HM PAP SMEAR

## 2014-09-01 ENCOUNTER — Encounter: Payer: Self-pay | Admitting: General Practice

## 2014-09-02 ENCOUNTER — Encounter: Payer: Self-pay | Admitting: Family Medicine

## 2014-09-02 ENCOUNTER — Ambulatory Visit: Payer: BLUE CROSS/BLUE SHIELD | Admitting: Family Medicine

## 2014-09-06 ENCOUNTER — Ambulatory Visit (INDEPENDENT_AMBULATORY_CARE_PROVIDER_SITE_OTHER): Payer: BLUE CROSS/BLUE SHIELD | Admitting: Family Medicine

## 2014-09-06 ENCOUNTER — Encounter: Payer: Self-pay | Admitting: Family Medicine

## 2014-09-06 VITALS — BP 128/80 | HR 116 | Temp 98.0°F | Resp 16 | Wt 270.1 lb

## 2014-09-06 DIAGNOSIS — Z720 Tobacco use: Secondary | ICD-10-CM

## 2014-09-06 DIAGNOSIS — F17200 Nicotine dependence, unspecified, uncomplicated: Secondary | ICD-10-CM | POA: Insufficient documentation

## 2014-09-06 DIAGNOSIS — F418 Other specified anxiety disorders: Secondary | ICD-10-CM

## 2014-09-06 DIAGNOSIS — J0100 Acute maxillary sinusitis, unspecified: Secondary | ICD-10-CM

## 2014-09-06 DIAGNOSIS — J019 Acute sinusitis, unspecified: Secondary | ICD-10-CM | POA: Insufficient documentation

## 2014-09-06 DIAGNOSIS — I10 Essential (primary) hypertension: Secondary | ICD-10-CM

## 2014-09-06 LAB — HEMOGLOBIN A1C: HEMOGLOBIN A1C: 6.1 % (ref 4.6–6.5)

## 2014-09-06 LAB — CBC WITH DIFFERENTIAL/PLATELET
BASOS ABS: 0 10*3/uL (ref 0.0–0.1)
Basophils Relative: 0.2 % (ref 0.0–3.0)
EOS PCT: 0.5 % (ref 0.0–5.0)
Eosinophils Absolute: 0.1 10*3/uL (ref 0.0–0.7)
HCT: 41.3 % (ref 36.0–46.0)
Hemoglobin: 14.4 g/dL (ref 12.0–15.0)
Lymphocytes Relative: 22.6 % (ref 12.0–46.0)
Lymphs Abs: 3.1 10*3/uL (ref 0.7–4.0)
MCHC: 34.8 g/dL (ref 30.0–36.0)
MCV: 87.8 fl (ref 78.0–100.0)
MONO ABS: 0.5 10*3/uL (ref 0.1–1.0)
Monocytes Relative: 3.6 % (ref 3.0–12.0)
Neutro Abs: 10 10*3/uL — ABNORMAL HIGH (ref 1.4–7.7)
Neutrophils Relative %: 73.1 % (ref 43.0–77.0)
Platelets: 296 10*3/uL (ref 150.0–400.0)
RBC: 4.7 Mil/uL (ref 3.87–5.11)
RDW: 13.8 % (ref 11.5–15.5)
WBC: 13.6 10*3/uL — AB (ref 4.0–10.5)

## 2014-09-06 LAB — HEPATIC FUNCTION PANEL
ALT: 17 U/L (ref 0–35)
AST: 17 U/L (ref 0–37)
Albumin: 3.9 g/dL (ref 3.5–5.2)
Alkaline Phosphatase: 62 U/L (ref 39–117)
BILIRUBIN DIRECT: 0 mg/dL (ref 0.0–0.3)
BILIRUBIN TOTAL: 0.4 mg/dL (ref 0.2–1.2)
TOTAL PROTEIN: 7.1 g/dL (ref 6.0–8.3)

## 2014-09-06 LAB — BASIC METABOLIC PANEL
BUN: 9 mg/dL (ref 6–23)
CALCIUM: 9.6 mg/dL (ref 8.4–10.5)
CHLORIDE: 106 meq/L (ref 96–112)
CO2: 24 meq/L (ref 19–32)
CREATININE: 0.56 mg/dL (ref 0.40–1.20)
GFR: 125.36 mL/min (ref >60.00)
GLUCOSE: 94 mg/dL (ref 70–99)
POTASSIUM: 3.9 meq/L (ref 3.5–5.1)
SODIUM: 136 meq/L (ref 135–145)

## 2014-09-06 LAB — LIPID PANEL
CHOLESTEROL: 160 mg/dL (ref 0–200)
HDL: 30.1 mg/dL — AB (ref >39.00)
NonHDL: 129.9
Total CHOL/HDL Ratio: 5
Triglycerides: 206 mg/dL — ABNORMAL HIGH (ref 0.0–149.0)
VLDL: 41.2 mg/dL — AB (ref 0.0–40.0)

## 2014-09-06 LAB — LDL CHOLESTEROL, DIRECT: LDL DIRECT: 102 mg/dL

## 2014-09-06 LAB — TSH: TSH: 3.83 u[IU]/mL (ref 0.35–4.50)

## 2014-09-06 MED ORDER — VARENICLINE TARTRATE 0.5 MG X 11 & 1 MG X 42 PO MISC: ORAL | Status: DC

## 2014-09-06 MED ORDER — VARENICLINE TARTRATE 1 MG PO TABS: 1.0000 mg | ORAL_TABLET | Freq: Two times a day (BID) | ORAL | Status: DC

## 2014-09-06 MED ORDER — ALPRAZOLAM 0.5 MG PO TABS: 0.5000 mg | ORAL_TABLET | Freq: Two times a day (BID) | ORAL | Status: DC | PRN

## 2014-09-06 NOTE — Assessment & Plan Note (Signed)
Improved.  Pt was able to successfully stop Effexor.  Only requiring Alprazolam intermittently.  No longer tearful or highly anxious.  Will continue to follow.

## 2014-09-06 NOTE — Assessment & Plan Note (Signed)
Chronic problem.  Adequate control.  Asymptomatic.  Check labs.  No anticipated med changes 

## 2014-09-06 NOTE — Assessment & Plan Note (Signed)
New.  No evidence of infxn but suspect pt's nasal congestion, ear pain, and facial pressure are all due to sinus inflammation.  Start OTC claritin/zyrtec.  Drink plenty of fluids.  Reviewed supportive care and red flags that should prompt return.  Pt expressed understanding and is in agreement w/ plan.

## 2014-09-06 NOTE — Assessment & Plan Note (Signed)
Chronic problem.  Pt is interested in starting Chantix.  Scripts provided along w/ instructions on use and warning about possible psych effects.  Will follow.

## 2014-09-06 NOTE — Assessment & Plan Note (Signed)
Chronic problem.  Pt now considering lap band.  Check labs to risk stratify.  Will follow.

## 2014-09-06 NOTE — Patient Instructions (Signed)
Schedule follow up for smoking cessation in 2 months We'll notify you of your lab results and make any changes if needed Try and make healthy food choices and get regular exercise Start the Chantix 2 weeks before your desired quit date- you can do this! Use the Alprazolam only as needed Start Claritin or Zyrtec daily (store brand works just as well) Drink plenty of fluids Call with any questions or concerns Happy New Year!

## 2014-09-06 NOTE — Progress Notes (Signed)
   Subjective:    Patient ID: Jenna Pope, female    DOB: 1971/02/19, 44 y.o.   MRN: [id number]  HPI HTN- chronic problem, on Losartan and HCTZ.  Adequate control.  Denies CP, SOB, HAs, visual changes.  Depression/anxiety- has gone off Effexor as of 3-4 months.  Taking Alprazolam prn- using 1-2x q5 days.  Pt reports Effexor caused weight gain.  Denies crying spells, panic attacks.  Tobacco- chronic problem, pt is interested in starting Chantix.  Starting 1 ppd.  'i need to stop'.    Ear pain- 'i just feel like i have sinus'.  R ear discomfort, nasal congestion.  'i stay stuffy'.  + maxillary pressure.  No HAs.  Not currently on any allergy meds.  Obesity- pt is considering lap band.  Did Neurosurgeon.  Has taken phentermine previously.   Review of Systems For ROS see HPI     Objective:   Physical Exam  Constitutional: She is oriented to person, place, and time. She appears well-developed and well-nourished. No distress.  obese  HENT:  Head: Normocephalic and atraumatic.  Eyes: Conjunctivae and EOM are normal. Pupils are equal, round, and reactive to light.  Neck: Normal range of motion. Neck supple. No thyromegaly present.  Cardiovascular: Normal rate, regular rhythm, normal heart sounds and intact distal pulses.   No murmur heard. Pulmonary/Chest: Effort normal and breath sounds normal. No respiratory distress.  Abdominal: Soft. She exhibits no distension. There is no tenderness.  Musculoskeletal: She exhibits no edema.  Lymphadenopathy:    She has no cervical adenopathy.  Neurological: She is alert and oriented to person, place, and time.  Skin: Skin is warm and dry.  Psychiatric: She has a normal mood and affect. Her behavior is normal.  Vitals reviewed.         Assessment & Plan:

## 2014-09-06 NOTE — Progress Notes (Signed)
Pre visit review using our clinic review tool, if applicable. No additional management support is needed unless otherwise documented below in the visit note. 

## 2014-09-07 ENCOUNTER — Telehealth: Payer: Self-pay | Admitting: Family Medicine

## 2014-09-07 ENCOUNTER — Other Ambulatory Visit: Payer: Self-pay | Admitting: Family Medicine

## 2014-09-07 DIAGNOSIS — D72829 Elevated white blood cell count, unspecified: Secondary | ICD-10-CM

## 2014-09-07 NOTE — Telephone Encounter (Signed)
emmi emailed °

## 2014-09-12 ENCOUNTER — Encounter: Payer: Self-pay | Admitting: Family Medicine

## 2014-09-20 ENCOUNTER — Encounter: Payer: Self-pay | Admitting: Family Medicine

## 2014-09-20 ENCOUNTER — Ambulatory Visit: Payer: Self-pay | Admitting: Family Medicine

## 2014-09-20 ENCOUNTER — Other Ambulatory Visit (INDEPENDENT_AMBULATORY_CARE_PROVIDER_SITE_OTHER): Payer: BLUE CROSS/BLUE SHIELD

## 2014-09-20 DIAGNOSIS — D72829 Elevated white blood cell count, unspecified: Secondary | ICD-10-CM

## 2014-09-20 LAB — CBC WITH DIFFERENTIAL/PLATELET
BASOS PCT: 0.2 % (ref 0.0–3.0)
Basophils Absolute: 0 10*3/uL (ref 0.0–0.1)
Eosinophils Absolute: 0.1 10*3/uL (ref 0.0–0.7)
Eosinophils Relative: 1.1 % (ref 0.0–5.0)
HCT: 40.7 % (ref 36.0–46.0)
Hemoglobin: 14.2 g/dL (ref 12.0–15.0)
LYMPHS PCT: 26.1 % (ref 12.0–46.0)
Lymphs Abs: 2.7 10*3/uL (ref 0.7–4.0)
MCHC: 34.8 g/dL (ref 30.0–36.0)
MCV: 88.5 fl (ref 78.0–100.0)
MONOS PCT: 4.6 % (ref 3.0–12.0)
Monocytes Absolute: 0.5 10*3/uL (ref 0.1–1.0)
Neutro Abs: 7.1 10*3/uL (ref 1.4–7.7)
Neutrophils Relative %: 68 % (ref 43.0–77.0)
Platelets: 276 10*3/uL (ref 150.0–400.0)
RBC: 4.6 Mil/uL (ref 3.87–5.11)
RDW: 13.1 % (ref 11.5–15.5)
WBC: 10.4 10*3/uL (ref 4.0–10.5)

## 2014-09-21 ENCOUNTER — Encounter: Payer: Self-pay | Admitting: Family Medicine

## 2014-10-03 ENCOUNTER — Encounter: Payer: Self-pay | Admitting: Family Medicine

## 2014-10-06 ENCOUNTER — Other Ambulatory Visit (INDEPENDENT_AMBULATORY_CARE_PROVIDER_SITE_OTHER): Payer: Self-pay | Admitting: Surgery

## 2014-10-31 ENCOUNTER — Encounter: Payer: Self-pay | Admitting: Medical

## 2014-10-31 ENCOUNTER — Ambulatory Visit (INDEPENDENT_AMBULATORY_CARE_PROVIDER_SITE_OTHER): Payer: BLUE CROSS/BLUE SHIELD | Admitting: Medical

## 2014-10-31 VITALS — BP 149/90 | HR 93 | Temp 98.2°F | Ht 62.0 in | Wt 271.0 lb

## 2014-10-31 DIAGNOSIS — J0100 Acute maxillary sinusitis, unspecified: Secondary | ICD-10-CM

## 2014-10-31 DIAGNOSIS — J309 Allergic rhinitis, unspecified: Secondary | ICD-10-CM | POA: Insufficient documentation

## 2014-10-31 DIAGNOSIS — J301 Allergic rhinitis due to pollen: Secondary | ICD-10-CM

## 2014-10-31 DIAGNOSIS — J019 Acute sinusitis, unspecified: Secondary | ICD-10-CM | POA: Insufficient documentation

## 2014-10-31 MED ORDER — LORATADINE 10 MG PO TABS: 10.0000 mg | ORAL_TABLET | Freq: Every day | ORAL | Status: DC

## 2014-10-31 MED ORDER — BENZONATATE 100 MG PO CAPS: 100.0000 mg | ORAL_CAPSULE | Freq: Three times a day (TID) | ORAL | Status: DC | PRN

## 2014-10-31 MED ORDER — ALBUTEROL SULFATE HFA 108 (90 BASE) MCG/ACT IN AERS: 2.0000 | INHALATION_SPRAY | Freq: Four times a day (QID) | RESPIRATORY_TRACT | Status: DC | PRN

## 2014-10-31 MED ORDER — FLUTICASONE PROPIONATE 50 MCG/ACT NA SUSP: 2.0000 | Freq: Every day | NASAL | Status: DC

## 2014-10-31 MED ORDER — AZITHROMYCIN 250 MG PO TABS: ORAL_TABLET | ORAL | Status: DC

## 2014-10-31 NOTE — Assessment & Plan Note (Addendum)
   Your appear to have a sinus infection. I am prescribing  azithromycin antibiotic for the infection. To help with the nasal congestion I prescribed nasal steroid. For your associated cough, I prescribed cough medicine.Benzonatate.  Rest, hydrate, tylenol for fever.  Follow up in 7 days or as needed.     With bilateral om. Rx azithromycin.

## 2014-10-31 NOTE — Assessment & Plan Note (Addendum)
Rx flonase and claritin (predcing sinus infection.)

## 2014-10-31 NOTE — Progress Notes (Signed)
Subjective:    Patient ID: Jenna Pope, female    DOB: 30-Oct-1970, 44 y.o.   MRN: [id number]  HPI  Pt in with mostly a cough. Stopped up ears. Nasal congestion( 3 days). Mild matting this am.(none during today).  Sneezing. Some pnd. Faint st.   No hx of allergies this time of year.  Pt is on chantix. Only.  Wheezing occasinally.   Some rt side sinus pressure and lt ear hurts.   Review of Systems  Constitutional: Negative for fever, chills and fatigue.  HENT: Positive for congestion, ear pain, postnasal drip and sneezing. Negative for tinnitus and trouble swallowing.        Mild left ear pressure.  Respiratory: Positive for cough and wheezing. Negative for choking and chest tightness.   Cardiovascular: Negative for chest pain and palpitations.  Neurological: Negative for dizziness and headaches.  Hematological: Negative for adenopathy. Does not bruise/bleed easily.  Psychiatric/Behavioral: Negative for behavioral problems and confusion.    Past Medical History  Diagnosis Date  . Hypertension   . GERD (gastroesophageal reflux disease)   . Carpal tunnel syndrome   . Shortness of breath   . Sleep apnea     uses cpap since 2/13  . Family history of anesthesia complication     had an aunt cardiac arrest during a knee surgery    History   Social History  . Marital Status: Married    Spouse Name: N/A  . Number of Children: N/A  . Years of Education: N/A   Occupational History  . Not on file.   Social History Main Topics  . Smoking status: Current Every Day Smoker -- 1.00 packs/day  . Smokeless tobacco: Not on file  . Alcohol Use: Yes     Comment: rare  . Drug Use: No  . Sexual Activity: Not on file   Other Topics Concern  . Not on file   Social History Narrative    Past Surgical History  Procedure Laterality Date  . Upper gastrointestinal endoscopy    . Wisdom tooth extraction    . Ovarian cyst removal    . Breast reduction surgery    . Carpal tunnel  release  04/15/2012    Procedure: CARPAL TUNNEL RELEASE;  Surgeon: Wynonia Sours, MD;  Location: [facility];  Service: Orthopedics;  Laterality: Right;    Family History  Problem Relation Age of Onset  . Arthritis Mother   . Alcohol abuse Mother   . Hyperlipidemia Father   . Heart disease Father   . Stroke Father   . Hypertension Father   . Cancer Son   . Arthritis Maternal Aunt   . Cancer Maternal Uncle   . Alcohol abuse Maternal Grandfather     Allergies  Allergen Reactions  . Ace Inhibitors     cough    Current Outpatient Prescriptions on File Prior to Visit  Medication Sig Dispense Refill  . ALPRAZolam (XANAX) 0.5 MG tablet Take 1 tablet (0.5 mg total) by mouth 2 (two) times daily as needed for anxiety. 90 tablet 0  . esomeprazole (NEXIUM) 40 MG capsule Take 40 mg by mouth daily at 12 noon.    . hydrochlorothiazide (HYDRODIURIL) 12.5 MG tablet Take 1 tablet (12.5 mg total) by mouth daily. 90 tablet 3  . ibuprofen (ADVIL,MOTRIN) 800 MG tablet Take 1 tablet (800 mg total) by mouth every 8 (eight) hours as needed for moderate pain. 60 tablet 0  . losartan (COZAAR) 50 MG tablet Take  1 tablet (50 mg total) by mouth daily. 90 tablet 3  . Multiple Vitamin (MULTIVITAMIN) tablet Take 1 tablet by mouth daily.    . potassium chloride SA (K-DUR,KLOR-CON) 20 MEQ tablet Take 1 tablet (20 mEq total) by mouth daily. 30 tablet 2  . varenicline (CHANTIX CONTINUING MONTH PAK) 1 MG tablet Take 1 tablet (1 mg total) by mouth 2 (two) times daily. 60 tablet 2  . varenicline (CHANTIX STARTING MONTH PAK) 0.5 MG X 11 & 1 MG X 42 tablet One 0.5 mg tab once daily x3 days, then one 0.5 mg tab BID x4 days, then one 1 mg tab BID 53 tablet 0   No current facility-administered medications on file prior to visit.    BP 149/90 mmHg  Pulse 93  Temp(Src) 98.2 F (36.8 C) (Oral)  Ht 5\' 2"  (1.575 m)  Wt 271 lb (122.925 kg)  BMI 49.55 kg/m2  SpO2 100%  LMP 10/14/2014      Objective:    Physical Exam  General  Mental Status - Alert. General Appearance - Well groomed. Not in acute distress.  Skin Rashes- No Rashes.  HEENT Head- Normal. Ear Auditory Canal - Left- Normal. Right - Normal.Tympanic Membrane- Left- red. Right- red Eye Sclera/Conjunctiva- Left- Normal. Right- Normal. Nose & Sinuses Nasal Mucosa- Left-  Boggy and Congested. Right-  Boggy and  Congested. Lt  maxillary sinus tender but no frontal sinus pressure. Mouth & Throat Lips: Upper Lip- Normal: no dryness, cracking, pallor, cyanosis, or vesicular eruption. Lower Lip-Normal: no dryness, cracking, pallor, cyanosis or vesicular eruption. Buccal Mucosa- Bilateral- No Aphthous ulcers. Oropharynx- No Discharge or Erythema. Tonsils: Characteristics- Bilateral- No Erythema or Congestion. Size/Enlargement- Bilateral- No enlargement. Discharge- bilateral-None.  Neck Neck- Supple. No Masses.   Chest and Lung Exam Auscultation: Breath Sounds:-Clear even and unlabored.  Cardiovascular Auscultation:Rythm- Regular, rate and rhythm. Murmurs & Other Heart Sounds:Ausculatation of the heart reveal- No Murmurs.  Lymphatic Head & Neck General Head & Neck Lymphatics: Bilateral: Description- No Localized lymphadenopathy.       Assessment & Plan:

## 2014-10-31 NOTE — Patient Instructions (Signed)
Allergic rhinitis Rx flonase and claritin   Acute sinus infection   Your appear to have a sinus infection. I am prescribing  azithromycin antibiotic for the infection. To help with the nasal congestion I prescribed nasal steroid. For your associated cough, I prescribed cough medicine.Benzonatate.  Rest, hydrate, tylenol for fever.  Follow up in 7 days or as needed.     With bilateral om. Rx azithromycin.    Albuterol rx if you wheezing worsens.

## 2014-10-31 NOTE — Progress Notes (Signed)
Pre visit review using our clinic review tool, if applicable. No additional management support is needed unless otherwise documented below in the visit note. 

## 2014-11-10 ENCOUNTER — Ambulatory Visit (HOSPITAL_COMMUNITY)
Admission: RE | Admit: 2014-11-10 | Discharge: 2014-11-10 | Disposition: A | Discharge status: Home / Self Care | Payer: BLUE CROSS/BLUE SHIELD | Source: Ambulatory Visit | Attending: Surgery | Admitting: Surgery

## 2014-11-10 ENCOUNTER — Other Ambulatory Visit (HOSPITAL_COMMUNITY): Payer: BLUE CROSS/BLUE SHIELD

## 2014-11-10 ENCOUNTER — Ambulatory Visit (HOSPITAL_COMMUNITY): Admission: RE | Admit: 2014-11-10 | Payer: BLUE CROSS/BLUE SHIELD | Source: Ambulatory Visit | Admitting: Surgery

## 2014-11-10 ENCOUNTER — Encounter (HOSPITAL_COMMUNITY): Admission: RE | Payer: Self-pay | Source: Ambulatory Visit

## 2014-11-10 ENCOUNTER — Other Ambulatory Visit: Payer: Self-pay

## 2014-11-10 SURGERY — BREATH TEST, FOR HELICOBACTER PYLORI

## 2014-11-12 ENCOUNTER — Encounter: Payer: BLUE CROSS/BLUE SHIELD | Attending: Surgery | Admitting: Dietician

## 2014-11-12 ENCOUNTER — Encounter: Payer: Self-pay | Admitting: Dietician

## 2014-11-12 DIAGNOSIS — Z713 Dietary counseling and surveillance: Secondary | ICD-10-CM | POA: Diagnosis not present

## 2014-11-12 DIAGNOSIS — Z6841 Body Mass Index (BMI) 40.0 and over, adult: Secondary | ICD-10-CM | POA: Insufficient documentation

## 2014-11-12 NOTE — Progress Notes (Signed)
  Pre-Op Assessment Visit:  Pre-Operative LAGB Surgery  Medical Nutrition Therapy:  Appt start time: 7867   End time:  6720.  Patient was seen on 11/12/2014 for Pre-Operative Nutrition Assessment. Assessment and letter of approval faxed to [facility] Bariatric Surgery Program coordinator on 11/12/2014.   Preferred Learning Style:   No preference indicated   Learning Readiness:   Ready  Handouts given during visit include:  Pre-Op Goals Bariatric Surgery Protein Shakes   During the appointment today the following Pre-Op Goals were reviewed with the patient: Maintain or lose weight as instructed by your surgeon Make healthy food choices Begin to limit portion sizes Limited concentrated sugars and fried foods Keep fat/sugar in the single digits per serving on   food labels Practice CHEWING your food  (aim for 30 chews per bite or until applesauce consistency) Practice not drinking 15 minutes before, during, and 30 minutes after each meal/snack Avoid all carbonated beverages  Avoid/limit caffeinated beverages  Avoid all sugar-sweetened beverages Consume 3 meals per day; eat every 3-5 hours Make a list of non-food related activities Aim for 64-100 ounces of FLUID daily  Aim for at least 60-80 grams of PROTEIN daily Look for a liquid protein source that contain ?15 g protein and ?5 g carbohydrate  (ex: shakes, drinks, shots)  Patient-Centered Goals: Jenna Pope would like to fit into some more clothes and increase her self esteem and be able to do more things physically. 6-7 level of confidence/10 level of importance   Demonstrated degree of understanding via:  Teach Back  Teaching Method Utilized:  Visual Auditory Hands on  Barriers to learning/adherence to lifestyle change: none  Patient to call the [facility] to enroll in Pre-Op and Post-Op Nutrition Education when surgery date is scheduled.

## 2014-11-12 NOTE — Patient Instructions (Signed)

## 2014-12-07 NOTE — Progress Notes (Signed)
  Pre-Operative Nutrition Class:  Appt start time: 7824   End time:  1830.  Patient was seen on 12/05/14 for Pre-Operative Bariatric Surgery Education at the [facility].   Surgery date:  Surgery type: LAGB Start weight at [facility]: 274 lbs on 11/12/14 Weight today: 268.5 lbs  TANITA  BODY COMP RESULTS  12/05/14   BMI (kg/m^2) 49.9   Fat Mass (lbs) 134.5   Fat Free Mass (lbs) 134   Total Body Water (lbs) 98   Samples given per MNT protocol. Patient educated on appropriate usage: Premier protein shake (strawberry - qty 1) Lot #: [id number] Exp: 03/2015  Unjury protein powder (chocolate - qty 1) Lot #: [id number] Exp: 05/2015  PB2 (chocolate - qty 1) Lot #: none Exp: 06/2015  Bariatric Advantage Calcium Citrate chew (caramel - qty 1) Lot #: [id number] Exp: 12/2014  The following the learning objectives were met by the patient during this course:  Identify Pre-Op Dietary Goals and will begin 2 weeks pre-operatively  Identify appropriate sources of fluids and proteins   State protein recommendations and appropriate sources pre and post-operatively  Identify Post-Operative Dietary Goals and will follow for 2 weeks post-operatively  Identify appropriate multivitamin and calcium sources  Describe the need for physical activity post-operatively and will follow MD recommendations  State when to call healthcare provider regarding medication questions or post-operative complications  Handouts given during class include:  Pre-Op Bariatric Surgery Diet Handout  Protein Shake Handout  Post-Op Bariatric Surgery Nutrition Handout  [facility] Program Information Flyer  Support Group Information Flyer  [facility] Bariatric Supplements Price List  Follow-Up Plan: Patient will follow-up at [facility] 2 weeks post operatively for diet advancement per MD.

## 2015-01-02 ENCOUNTER — Other Ambulatory Visit: Payer: Self-pay | Admitting: Family Medicine

## 2015-01-02 MED ORDER — ALPRAZOLAM 0.5 MG PO TABS: 0.5000 mg | ORAL_TABLET | Freq: Two times a day (BID) | ORAL | Status: DC | PRN

## 2015-01-02 NOTE — Telephone Encounter (Signed)
Last OV 09-06-14 Alprazolam last filled 09-06-14 #90 with 0

## 2015-01-02 NOTE — Telephone Encounter (Signed)
Med filled and faxed.  

## 2015-01-17 ENCOUNTER — Ambulatory Visit: Payer: BLUE CROSS/BLUE SHIELD

## 2015-01-26 NOTE — Progress Notes (Signed)
Please put orders in Epic surgery 02-06-15 pre op 02-02-15 Thanks

## 2015-01-27 ENCOUNTER — Ambulatory Visit: Payer: Self-pay | Admitting: Surgery

## 2015-01-27 NOTE — H&P (Signed)
Jenna Pope 10/06/2014 10:29 AM Location: [facility] Patient #: [id number] DOB: 05-06-1971 Married / Language: English / Race: White Female  History of Present Illness  Patient words:interested in Lapband surgery  The patient is a 44 year old female who presents for a bariatric surgery evaluation. She is interested in laparoscopic adjustable gastric banding. She has a friend that had this procedure done several months ago and has done well. She has been to our informational seminar and I did mention sleeve gastrectomy and gastric bypass to her but she is interested in lap band surgery. She is followed by Dr. Birdie Riddle having been previously followed at [facility] in [facility]. In addtion, she has been treated for obesity by her OB Dr. Nori Riis who prescribed her with Phentermine.  Her weight today is 270 which gives her a BMI of 49. 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 She's had a child natural vaginal delivery. No prior abdominal surgery. She's had carpal tunnel surgery and breast reduction surgery back in the 90s. We gave her a booklet lap band as well as I talked her about the procedure in detail. She has been frustrated with her many weight loss attempts when include medically supervised diet and pharamacologic intervention. She would like to go ahead and pursue Lapband placement. Her preop workup included an UGI which did not show a hiatal hernia but did suggest hepatomegaly.  Ultrasound negative except for liver enlargement.    She was seen in the office January 27, 2015 for preop.       Other Problems Elbert Ewings, CMA; 10/06/2014 10:29 AM) Anxiety Disorder Arthritis Back Pain Gastroesophageal Reflux Disease High blood pressure Other disease, cancer, significant illness Sleep Apnea  Past Surgical History Elbert Ewings, CMA; 10/06/2014 10:29 AM) Breast  Biopsy Right. Mammoplasty; Reduction Bilateral. Oral Surgery  Diagnostic Studies History Elbert Ewings, [place]; 10/06/2014 10:29 AM) Colonoscopy never Mammogram within last year Pap Smear 1-5 years ago  Allergies Elbert Ewings, CMA; 10/06/2014 10:30 AM) No Known Drug Allergies02/25/2016  Medication History Elbert Ewings, CMA; 10/06/2014 10:33 AM) Losartan Potassium (100MG  Tablet, Oral daily) Active. Hydrochlorothiazide (25MG  Tablet, Oral daily) Active. Xanax (0.5MG  Tablet, Oral as needed) Active. Chantix (1MG  Tablet, Oral two times daily) Active. NexIUM (40MG  Packet, Oral daily) Active.  Social History Elbert Ewings, [place]; 10/06/2014 10:29 AM) Alcohol use Occasional alcohol use. Caffeine use Coffee, Tea. No drug use Tobacco use Current every day smoker.  Family History Elbert Ewings, [place]; 10/06/2014 10:29 AM) Alcohol Abuse Brother, Family Members In General, Father. Anesthetic complications Family Members In General. Arthritis Father, Mother. Breast Cancer Family Members In General. Cerebrovascular Accident Father. Colon Polyps Mother. Depression Brother, Father, Mother, Sister. Diabetes Mellitus Father. Heart Disease Father. Heart disease in female family member before age 31 Hypertension Family Members In General, Father, Mother. Ischemic Bowel Disease Mother. Kidney Disease Family Members In General. Migraine Headache Mother, Sister. Respiratory Condition Brother, Family Members In [place], Father.  Pregnancy / Birth History Elbert Ewings, CMA; 10/06/2014 10:29 AM) Age at menarche 50 years. Contraceptive History Intrauterine device, Oral contraceptives. Gravida 1 Irregular periods Maternal age 79-20 Para 1  Review of Systems Elbert Ewings CMA; 10/06/2014 10:29 AM) General Present- Fatigue and Weight Gain. Not Present- Appetite Loss, Chills, Fever, Night Sweats and Weight Loss. Skin Not Present- Change in Wart/Mole, Dryness, Hives, Jaundice, New  Lesions, Non-Healing Wounds, Rash and Ulcer. HEENT Present- Seasonal Allergies and Sinus Pain.  Not Present- Earache, Hearing Loss, Hoarseness, Nose Bleed, Oral Ulcers, Ringing in the Ears, Sore Throat, Visual Disturbances, Wears glasses/contact lenses and Yellow Eyes. Respiratory Present- Difficulty Breathing. Not Present- Bloody sputum, Chronic Cough, Snoring and Wheezing. Breast Not Present- Breast Mass, Breast Pain, Nipple Discharge and Skin Changes. Cardiovascular Present- Difficulty Breathing Lying Down, Leg Cramps and Swelling of Extremities. Not Present- Chest Pain, Palpitations, Rapid Heart Rate and Shortness of Breath. Gastrointestinal Not Present- Abdominal Pain, Bloating, Bloody Stool, Change in Bowel Habits, Chronic diarrhea, Constipation, Difficulty Swallowing, Excessive gas, Gets full quickly at meals, Hemorrhoids, Indigestion, Nausea, Rectal Pain and Vomiting. Female Genitourinary Not Present- Frequency, Nocturia, Painful Urination, Pelvic Pain and Urgency. Musculoskeletal Present- Back Pain and Joint Pain. Not Present- Joint Stiffness, Muscle Pain, Muscle Weakness and Swelling of Extremities. Neurological Not Present- Decreased Memory, Fainting, Headaches, Numbness, Seizures, Tingling, Tremor, Trouble walking and Weakness. Psychiatric Present- Anxiety. Not Present- Bipolar, Change in Sleep Pattern, Depression, Fearful and Frequent crying. Endocrine Present- Excessive Hunger. Not Present- Cold Intolerance, Hair Changes, Heat Intolerance, Hot flashes and New Diabetes. Hematology Not Present- Easy Bruising, Excessive bleeding, Gland problems, HIV and Persistent Infections.   Vitals Elbert Ewings CMA; 10/06/2014 10:34 AM) 10/06/2014 10:33 AM Weight: 270.4 lb Height: 62in Body Surface Area: 2.32 m Body Mass Index: 49.46 kg/m Temp.: 96.81F(Temporal)  Pulse: 82 (Regular)  Resp.: 17 (Unlabored)  BP: 132/78 (Sitting, Left Arm, Standard)    Physical Exam (Aliah Eriksson B. Hassell Done  MD; 10/06/2014 10:55 AM) The physical exam findings are as follows: Note:HEENT unremarkable Neck no goiter Chest clear Heart SR without murmurs Abdomen obese with large pannus displacing her legs laterally GU not examined Ext FROM but with knee pain from osteoarthritis    Assessment & Plan Rodman Key B. Hassell Done MD; 10/06/2014 10:59 AM) MORBID OBESITY (278.01  E66.01) Impression: For lapband placement.

## 2015-01-30 ENCOUNTER — Encounter: Payer: Self-pay | Admitting: Family Medicine

## 2015-01-30 ENCOUNTER — Encounter: Payer: Self-pay | Admitting: General Practice

## 2015-01-31 NOTE — Patient Instructions (Addendum)
Jenna Pope  01/31/2015   Your procedure is scheduled on:    02/06/2015    Report to [facility] Main  Entrance and go to [facility].  Take the [facility] elevators to the 3rd floor and you will be at Short Stay.  Arrive at 1000am.               .  Call this number if you have problems the morning of surgery [phone]   Remember: ONLY 1 PERSON MAY GO WITH YOU TO SHORT STAY TO GET  READY MORNING OF YOUR SURGERY.  Do not eat food or drink liquids :After Midnight.     Take these medicines the morning of surgery with A SIP OF WATER:     Xanax if needed, nexium if needed . Thyroid ( [place])                               You may not have any metal on your body including hair pins and              piercings  Do not wear jewelry, make-up, lotions, powders or perfumes, deodorant             Do not wear nail polish.  Do not shave  48 hours prior to surgery.             Bring CPAP mask and tubing    Do not bring valuables to the hospital. [place].  Contacts, dentures or bridgework may not be worn into surgery.  Leave suitcase in the car. After surgery it may be brought to your room.      Special Instructions: coughing and deep breathing exercises, leg exercises               Please read over the following fact sheets you were given: _____________________________________________________________________             [facility] - Preparing for Surgery Before surgery, you can play an important role.  Because skin is not sterile, your skin needs to be as free of germs as possible.  You can reduce the number of germs on your skin by washing with CHG (chlorahexidine gluconate) soap before surgery.  CHG is an antiseptic cleaner which kills germs and bonds with the skin to continue killing germs even after washing. Please DO NOT use if you have an allergy to CHG or antibacterial soaps.  If your skin becomes reddened/irritated  stop using the CHG and inform your nurse when you arrive at Short Stay. Do not shave (including legs and underarms) for at least 48 hours prior to the first CHG shower.  You may shave your face/neck. Please follow these instructions carefully:  1.  Shower with CHG Soap the night before surgery and the  morning of Surgery.  2.  If you choose to wash your hair, wash your hair first as usual with your  normal  shampoo.  3.  After you shampoo, rinse your hair and body thoroughly to remove the  shampoo.                           4.  Use CHG as you would any other liquid  soap.  You can apply chg directly  to the skin and wash                       Gently with a scrungie or clean washcloth.  5.  Apply the CHG Soap to your body ONLY FROM THE NECK DOWN.   Do not use on face/ open                           Wound or open sores. Avoid contact with eyes, ears mouth and genitals (private parts).                       Wash face,  Genitals (private parts) with your normal soap.             6.  Wash thoroughly, paying special attention to the area where your surgery  will be performed.  7.  Thoroughly rinse your body with warm water from the neck down.  8.  DO NOT shower/wash with your normal soap after using and rinsing off  the CHG Soap.                9.  Pat yourself dry with a clean towel.            10.  Wear clean pajamas.            11.  Place clean sheets on your bed the night of your first shower and do not  sleep with pets. Day of Surgery : Do not apply any lotions/deodorants the morning of surgery.  Please wear clean clothes to the hospital/surgery center.  FAILURE TO FOLLOW THESE INSTRUCTIONS MAY RESULT IN THE CANCELLATION OF YOUR SURGERY PATIENT SIGNATURE_________________________________  NURSE SIGNATURE__________________________________  ________________________________________________________________________

## 2015-02-02 ENCOUNTER — Encounter (HOSPITAL_COMMUNITY)
Admission: RE | Admit: 2015-02-02 | Discharge: 2015-02-02 | Disposition: A | Discharge status: Home / Self Care | Payer: BLUE CROSS/BLUE SHIELD | Source: Ambulatory Visit | Attending: Surgery | Admitting: Surgery

## 2015-02-02 ENCOUNTER — Encounter (HOSPITAL_COMMUNITY): Payer: Self-pay

## 2015-02-02 DIAGNOSIS — Z01812 Encounter for preprocedural laboratory examination: Secondary | ICD-10-CM | POA: Insufficient documentation

## 2015-02-02 HISTORY — DX: Anxiety disorder, unspecified: F41.9

## 2015-02-02 HISTORY — DX: Unspecified osteoarthritis, unspecified site: M19.90

## 2015-02-02 HISTORY — DX: Hypothyroidism, unspecified: E03.9

## 2015-02-02 LAB — CBC WITH DIFFERENTIAL/PLATELET
Basophils Absolute: 0 10*3/uL (ref 0.0–0.1)
Basophils Relative: 0 % (ref 0–1)
Eosinophils Absolute: 0.1 10*3/uL (ref 0.0–0.7)
Eosinophils Relative: 1 % (ref 0–5)
HEMATOCRIT: 41.4 % (ref 36.0–46.0)
HEMOGLOBIN: 13.9 g/dL (ref 12.0–15.0)
LYMPHS ABS: 2.3 10*3/uL (ref 0.7–4.0)
LYMPHS PCT: 21 % (ref 12–46)
MCH: 30.3 pg (ref 26.0–34.0)
MCHC: 33.6 g/dL (ref 30.0–36.0)
MCV: 90.4 fL (ref 78.0–100.0)
Monocytes Absolute: 0.6 10*3/uL (ref 0.1–1.0)
Monocytes Relative: 5 % (ref 3–12)
Neutro Abs: 7.9 10*3/uL — ABNORMAL HIGH (ref 1.7–7.7)
Neutrophils Relative %: 73 % (ref 43–77)
PLATELETS: 260 10*3/uL (ref 150–400)
RBC: 4.58 MIL/uL (ref 3.87–5.11)
RDW: 13.2 % (ref 11.5–15.5)
WBC: 10.9 10*3/uL — AB (ref 4.0–10.5)

## 2015-02-02 LAB — COMPREHENSIVE METABOLIC PANEL
ALT: 25 U/L (ref 14–54)
AST: 24 U/L (ref 15–41)
Albumin: 3.8 g/dL (ref 3.5–5.0)
Alkaline Phosphatase: 77 U/L (ref 38–126)
Anion gap: 7 (ref 5–15)
BUN: 18 mg/dL (ref 6–20)
CO2: 26 mmol/L (ref 22–32)
Calcium: 9.3 mg/dL (ref 8.9–10.3)
Chloride: 105 mmol/L (ref 101–111)
Creatinine, Ser: 0.75 mg/dL (ref 0.44–1.00)
GFR calc Af Amer: >60 mL/min (ref >60)
GFR calc non Af Amer: >60 mL/min (ref >60)
Glucose, Bld: 107 mg/dL — ABNORMAL HIGH (ref 65–99)
Potassium: 4 mmol/L (ref 3.5–5.1)
SODIUM: 138 mmol/L (ref 135–145)
Total Bilirubin: 0.4 mg/dL (ref 0.3–1.2)
Total Protein: 7.2 g/dL (ref 6.5–8.1)

## 2015-02-02 LAB — HCG, SERUM, QUALITATIVE: Preg, Serum: NEGATIVE

## 2015-02-06 ENCOUNTER — Encounter (HOSPITAL_COMMUNITY)
Admission: RE | Disposition: A | Discharge status: Home / Self Care | Payer: Self-pay | Source: Ambulatory Visit | Attending: Surgery

## 2015-02-06 ENCOUNTER — Inpatient Hospital Stay (HOSPITAL_COMMUNITY): Payer: BLUE CROSS/BLUE SHIELD | Admitting: Certified Registered Nurse Anesthetist

## 2015-02-06 ENCOUNTER — Observation Stay (HOSPITAL_COMMUNITY)
Admission: RE | Admit: 2015-02-06 | Discharge: 2015-02-07 | Disposition: A | Discharge status: Home / Self Care | Payer: BLUE CROSS/BLUE SHIELD | Source: Ambulatory Visit | Attending: Surgery | Admitting: Surgery

## 2015-02-06 ENCOUNTER — Encounter (HOSPITAL_COMMUNITY): Payer: Self-pay | Admitting: *Deleted

## 2015-02-06 DIAGNOSIS — Z6841 Body Mass Index (BMI) 40.0 and over, adult: Secondary | ICD-10-CM | POA: Diagnosis not present

## 2015-02-06 DIAGNOSIS — M13862 Other specified arthritis, left knee: Secondary | ICD-10-CM | POA: Insufficient documentation

## 2015-02-06 DIAGNOSIS — K219 Gastro-esophageal reflux disease without esophagitis: Secondary | ICD-10-CM | POA: Insufficient documentation

## 2015-02-06 DIAGNOSIS — F17210 Nicotine dependence, cigarettes, uncomplicated: Secondary | ICD-10-CM | POA: Insufficient documentation

## 2015-02-06 DIAGNOSIS — I10 Essential (primary) hypertension: Secondary | ICD-10-CM | POA: Diagnosis not present

## 2015-02-06 DIAGNOSIS — G4730 Sleep apnea, unspecified: Secondary | ICD-10-CM | POA: Diagnosis not present

## 2015-02-06 DIAGNOSIS — M13861 Other specified arthritis, right knee: Secondary | ICD-10-CM | POA: Diagnosis not present

## 2015-02-06 DIAGNOSIS — F419 Anxiety disorder, unspecified: Secondary | ICD-10-CM | POA: Diagnosis not present

## 2015-02-06 DIAGNOSIS — E039 Hypothyroidism, unspecified: Secondary | ICD-10-CM | POA: Insufficient documentation

## 2015-02-06 DIAGNOSIS — Z9884 Bariatric surgery status: Secondary | ICD-10-CM

## 2015-02-06 DIAGNOSIS — Z888 Allergy status to other drugs, medicaments and biological substances status: Secondary | ICD-10-CM | POA: Diagnosis not present

## 2015-02-06 HISTORY — PX: LAPAROSCOPIC GASTRIC BANDING: SHX1100

## 2015-02-06 LAB — CBC
HEMATOCRIT: 39.7 % (ref 36.0–46.0)
HEMOGLOBIN: 13.8 g/dL (ref 12.0–15.0)
MCH: 30.7 pg (ref 26.0–34.0)
MCHC: 34.8 g/dL (ref 30.0–36.0)
MCV: 88.4 fL (ref 78.0–100.0)
Platelets: 240 10*3/uL (ref 150–400)
RBC: 4.49 MIL/uL (ref 3.87–5.11)
RDW: 13.1 % (ref 11.5–15.5)
WBC: 15.8 10*3/uL — AB (ref 4.0–10.5)

## 2015-02-06 LAB — CREATININE, SERUM
CREATININE: 0.77 mg/dL (ref 0.44–1.00)
GFR calc Af Amer: >60 mL/min (ref >60)
GFR calc non Af Amer: >60 mL/min (ref >60)

## 2015-02-06 SURGERY — GASTRIC BANDING, LAPAROSCOPIC
Anesthesia: General

## 2015-02-06 MED ORDER — NEOSTIGMINE METHYLSULFATE 10 MG/10ML IV SOLN
INTRAVENOUS | Status: AC
  Filled 2015-02-06: qty 1

## 2015-02-06 MED ORDER — CHLORHEXIDINE GLUCONATE 4 % EX LIQD: 60.0000 mL | Freq: Once | CUTANEOUS | Status: DC

## 2015-02-06 MED ORDER — ONDANSETRON HCL 4 MG/2ML IJ SOLN: 4.0000 mg | INTRAMUSCULAR | Status: DC | PRN

## 2015-02-06 MED ORDER — DEXAMETHASONE SODIUM PHOSPHATE 10 MG/ML IJ SOLN
INTRAMUSCULAR | Status: DC | PRN
  Administered 2015-02-06: 10 mg via INTRAVENOUS

## 2015-02-06 MED ORDER — SODIUM CHLORIDE 0.9 % IJ SOLN
INTRAMUSCULAR | Status: DC | PRN
  Administered 2015-02-06: 20 mL

## 2015-02-06 MED ORDER — GLYCOPYRROLATE 0.2 MG/ML IJ SOLN
INTRAMUSCULAR | Status: DC | PRN
  Administered 2015-02-06: 0.6 mg via INTRAVENOUS

## 2015-02-06 MED ORDER — SUCCINYLCHOLINE CHLORIDE 20 MG/ML IJ SOLN
INTRAMUSCULAR | Status: DC | PRN
  Administered 2015-02-06: 100 mg via INTRAVENOUS

## 2015-02-06 MED ORDER — SODIUM CHLORIDE 0.9 % IJ SOLN
INTRAMUSCULAR | Status: AC
  Filled 2015-02-06: qty 50

## 2015-02-06 MED ORDER — PANTOPRAZOLE SODIUM 40 MG IV SOLR
40.0000 mg | Freq: Every day | INTRAVENOUS | Status: DC
  Administered 2015-02-06: 40 mg via INTRAVENOUS
  Filled 2015-02-06 (×2): qty 40

## 2015-02-06 MED ORDER — DEXAMETHASONE SODIUM PHOSPHATE 10 MG/ML IJ SOLN
INTRAMUSCULAR | Status: AC
  Filled 2015-02-06: qty 1

## 2015-02-06 MED ORDER — MORPHINE SULFATE 2 MG/ML IJ SOLN
2.0000 mg | INTRAMUSCULAR | Status: DC | PRN
  Administered 2015-02-06 – 2015-02-07 (×2): 2 mg via INTRAVENOUS
  Filled 2015-02-06 (×2): qty 1

## 2015-02-06 MED ORDER — METOCLOPRAMIDE HCL 5 MG/ML IJ SOLN
INTRAMUSCULAR | Status: DC | PRN
  Administered 2015-02-06: 10 mg via INTRAVENOUS

## 2015-02-06 MED ORDER — HYDROMORPHONE HCL 1 MG/ML IJ SOLN
0.2500 mg | INTRAMUSCULAR | Status: DC | PRN
  Administered 2015-02-06: 0.5 mg via INTRAVENOUS

## 2015-02-06 MED ORDER — PROPOFOL 10 MG/ML IV BOLUS
INTRAVENOUS | Status: AC
  Filled 2015-02-06: qty 20

## 2015-02-06 MED ORDER — LABETALOL HCL 5 MG/ML IV SOLN
INTRAVENOUS | Status: DC | PRN
  Administered 2015-02-06: 5 mg via INTRAVENOUS
  Administered 2015-02-06: 2.5 mg via INTRAVENOUS

## 2015-02-06 MED ORDER — SCOPOLAMINE 1 MG/3DAYS TD PT72
MEDICATED_PATCH | TRANSDERMAL | Status: AC
  Filled 2015-02-06: qty 1

## 2015-02-06 MED ORDER — ONDANSETRON HCL 4 MG/2ML IJ SOLN
INTRAMUSCULAR | Status: AC
  Filled 2015-02-06: qty 2

## 2015-02-06 MED ORDER — DEXTROSE 5 % IV SOLN
2.0000 g | INTRAVENOUS | Status: AC
  Administered 2015-02-06: 2 g via INTRAVENOUS

## 2015-02-06 MED ORDER — UNJURY CHOCOLATE CLASSIC POWDER: 2.0000 [oz_av] | Freq: Four times a day (QID) | ORAL | Status: DC

## 2015-02-06 MED ORDER — FENTANYL CITRATE (PF) 100 MCG/2ML IJ SOLN
INTRAMUSCULAR | Status: DC | PRN
  Administered 2015-02-06: 100 ug via INTRAVENOUS
  Administered 2015-02-06 (×3): 50 ug via INTRAVENOUS

## 2015-02-06 MED ORDER — FENTANYL CITRATE (PF) 250 MCG/5ML IJ SOLN
INTRAMUSCULAR | Status: AC
  Filled 2015-02-06: qty 5

## 2015-02-06 MED ORDER — LACTATED RINGERS IV SOLN
INTRAVENOUS | Status: DC
  Administered 2015-02-06: 1000 mL via INTRAVENOUS
  Administered 2015-02-06: 13:00:00 via INTRAVENOUS

## 2015-02-06 MED ORDER — PROMETHAZINE HCL 25 MG/ML IJ SOLN: 6.2500 mg | INTRAMUSCULAR | Status: DC | PRN

## 2015-02-06 MED ORDER — MIDAZOLAM HCL 5 MG/5ML IJ SOLN
INTRAMUSCULAR | Status: DC | PRN
  Administered 2015-02-06: 2 mg via INTRAVENOUS

## 2015-02-06 MED ORDER — UNJURY VANILLA POWDER: 2.0000 [oz_av] | Freq: Four times a day (QID) | ORAL | Status: DC

## 2015-02-06 MED ORDER — METOCLOPRAMIDE HCL 5 MG/ML IJ SOLN
INTRAMUSCULAR | Status: AC
  Filled 2015-02-06: qty 2

## 2015-02-06 MED ORDER — ONDANSETRON HCL 4 MG/2ML IJ SOLN
INTRAMUSCULAR | Status: DC | PRN
  Administered 2015-02-06: 4 mg via INTRAVENOUS

## 2015-02-06 MED ORDER — HEPARIN SODIUM (PORCINE) 5000 UNIT/ML IJ SOLN
5000.0000 [IU] | INTRAMUSCULAR | Status: AC
  Administered 2015-02-06: 5000 [IU] via SUBCUTANEOUS
  Filled 2015-02-06: qty 1

## 2015-02-06 MED ORDER — LACTATED RINGERS IR SOLN
Status: DC | PRN
  Administered 2015-02-06: 1000 mL

## 2015-02-06 MED ORDER — ROCURONIUM BROMIDE 100 MG/10ML IV SOLN
INTRAVENOUS | Status: DC | PRN
  Administered 2015-02-06 (×2): 10 mg via INTRAVENOUS
  Administered 2015-02-06: 40 mg via INTRAVENOUS

## 2015-02-06 MED ORDER — ROCURONIUM BROMIDE 100 MG/10ML IV SOLN
INTRAVENOUS | Status: AC
  Filled 2015-02-06: qty 1

## 2015-02-06 MED ORDER — LABETALOL HCL 5 MG/ML IV SOLN
INTRAVENOUS | Status: AC
  Filled 2015-02-06: qty 4

## 2015-02-06 MED ORDER — MIDAZOLAM HCL 2 MG/2ML IJ SOLN
INTRAMUSCULAR | Status: AC
  Filled 2015-02-06: qty 2

## 2015-02-06 MED ORDER — ACETAMINOPHEN 160 MG/5ML PO SOLN: 325.0000 mg | ORAL | Status: DC | PRN

## 2015-02-06 MED ORDER — HEPARIN SODIUM (PORCINE) 5000 UNIT/ML IJ SOLN
5000.0000 [IU] | Freq: Three times a day (TID) | INTRAMUSCULAR | Status: DC
  Administered 2015-02-06 – 2015-02-07 (×2): 5000 [IU] via SUBCUTANEOUS
  Filled 2015-02-06 (×5): qty 1

## 2015-02-06 MED ORDER — BUPIVACAINE LIPOSOME 1.3 % IJ SUSP
INTRAMUSCULAR | Status: DC | PRN
  Administered 2015-02-06: 40 mL

## 2015-02-06 MED ORDER — BUPIVACAINE LIPOSOME 1.3 % IJ SUSP
20.0000 mL | Freq: Once | INTRAMUSCULAR | Status: DC
  Filled 2015-02-06: qty 20

## 2015-02-06 MED ORDER — PROPOFOL 10 MG/ML IV BOLUS
INTRAVENOUS | Status: DC | PRN
Start: 2015-02-06 — End: 2015-02-06
  Administered 2015-02-06: 200 mg via INTRAVENOUS

## 2015-02-06 MED ORDER — NICOTINE 7 MG/24HR TD PT24
7.0000 mg | MEDICATED_PATCH | Freq: Every day | TRANSDERMAL | Status: DC
  Administered 2015-02-06 – 2015-02-07 (×2): 7 mg via TRANSDERMAL
  Filled 2015-02-06 (×2): qty 1

## 2015-02-06 MED ORDER — LIDOCAINE HCL (CARDIAC) 20 MG/ML IV SOLN
INTRAVENOUS | Status: AC
  Filled 2015-02-06: qty 5

## 2015-02-06 MED ORDER — LIDOCAINE HCL (CARDIAC) 20 MG/ML IV SOLN
INTRAVENOUS | Status: DC | PRN
  Administered 2015-02-06: 100 mg via INTRAVENOUS

## 2015-02-06 MED ORDER — SCOPOLAMINE 1 MG/3DAYS TD PT72
1.0000 | MEDICATED_PATCH | Freq: Once | TRANSDERMAL | Status: DC
  Administered 2015-02-06: 1.5 mg via TRANSDERMAL
  Filled 2015-02-06: qty 1

## 2015-02-06 MED ORDER — OXYCODONE HCL 5 MG/5ML PO SOLN
5.0000 mg | ORAL | Status: DC | PRN
  Administered 2015-02-07: 10 mg via ORAL
  Filled 2015-02-06: qty 10

## 2015-02-06 MED ORDER — DEXTROSE 5 % IV SOLN
INTRAVENOUS | Status: AC
  Filled 2015-02-06: qty 2

## 2015-02-06 MED ORDER — UNJURY CHICKEN SOUP POWDER: 2.0000 [oz_av] | Freq: Four times a day (QID) | ORAL | Status: DC

## 2015-02-06 MED ORDER — CETYLPYRIDINIUM CHLORIDE 0.05 % MT LIQD
7.0000 mL | Freq: Two times a day (BID) | OROMUCOSAL | Status: DC
  Administered 2015-02-06: 7 mL via OROMUCOSAL

## 2015-02-06 MED ORDER — ACETAMINOPHEN 160 MG/5ML PO SOLN: 650.0000 mg | ORAL | Status: DC | PRN

## 2015-02-06 MED ORDER — HYDROMORPHONE HCL 1 MG/ML IJ SOLN
INTRAMUSCULAR | Status: AC
  Filled 2015-02-06: qty 1

## 2015-02-06 MED ORDER — NEOSTIGMINE METHYLSULFATE 10 MG/10ML IV SOLN
INTRAVENOUS | Status: DC | PRN
Start: 2015-02-06 — End: 2015-02-06
  Administered 2015-02-06: 5 mg via INTRAVENOUS

## 2015-02-06 MED ORDER — SODIUM CHLORIDE 0.9 % IJ SOLN
INTRAMUSCULAR | Status: AC
  Filled 2015-02-06: qty 20

## 2015-02-06 SURGICAL SUPPLY — 51 items
BENZOIN TINCTURE PRP APPL 2/3 (GAUZE/BANDAGES/DRESSINGS) IMPLANT
BLADE SURG 15 STRL LF DISP TIS (BLADE) ×1 IMPLANT
BLADE SURG 15 STRL SS (BLADE) ×1
DECANTER SPIKE VIAL GLASS SM (MISCELLANEOUS) ×2 IMPLANT
DEVICE SUT QUICK LOAD TK 5 (STAPLE) IMPLANT
DEVICE SUT TI-KNOT TK 5X26 (MISCELLANEOUS) IMPLANT
DEVICE SUTURE ENDOST 10MM (ENDOMECHANICALS) IMPLANT
DISSECTOR BLUNT TIP ENDO 5MM (MISCELLANEOUS) IMPLANT
DRAPE CAMERA CLOSED 9X96 (DRAPES) ×2 IMPLANT
ELECT PENCIL ROCKER SW 15FT (MISCELLANEOUS) ×2 IMPLANT
ELECT REM PT RETURN 9FT ADLT (ELECTROSURGICAL) ×2
ELECTRODE REM PT RTRN 9FT ADLT (ELECTROSURGICAL) ×1 IMPLANT
GAUZE SPONGE 4X4 12PLY STRL (GAUZE/BANDAGES/DRESSINGS) ×2 IMPLANT
GLOVE BIOGEL M 8.0 STRL (GLOVE) ×12 IMPLANT
GOWN SPEC L4 XLG W/TWL (GOWN DISPOSABLE) ×4 IMPLANT
GOWN STRL REUS W/TWL XL LVL3 (GOWN DISPOSABLE) ×6 IMPLANT
HOVERMATT SINGLE USE (MISCELLANEOUS) ×2 IMPLANT
KIT BASIN OR (CUSTOM PROCEDURE TRAY) ×2 IMPLANT
LIQUID BAND (GAUZE/BANDAGES/DRESSINGS) ×2 IMPLANT
MESH HERNIA 1X4 RECT BARD (Mesh General) ×1 IMPLANT
MESH HERNIA BARD 1X4 (Mesh General) ×1 IMPLANT
NEEDLE SPNL 22GX3.5 QUINCKE BK (NEEDLE) ×2 IMPLANT
PACK UNIVERSAL I (CUSTOM PROCEDURE TRAY) ×2 IMPLANT
SCISSORS LAP 5X45 EPIX DISP (ENDOMECHANICALS) ×2 IMPLANT
SET IRRIG TUBING LAPAROSCOPIC (IRRIGATION / IRRIGATOR) ×2 IMPLANT
SHEARS CURVED HARMONIC AC 45CM (MISCELLANEOUS) IMPLANT
SLEEVE ADV FIXATION 5X100MM (TROCAR) ×2 IMPLANT
SOLUTION ANTI FOG 6CC (MISCELLANEOUS) ×2 IMPLANT
SPONGE LAP 18X18 X RAY DECT (DISPOSABLE) ×2 IMPLANT
STAPLER VISISTAT 35W (STAPLE) ×2 IMPLANT
STRIP CLOSURE SKIN 1/2X4 (GAUZE/BANDAGES/DRESSINGS) IMPLANT
SUT ETHIBOND 2 0 SH (SUTURE) ×3
SUT ETHIBOND 2 0 SH 36X2 (SUTURE) ×3 IMPLANT
SUT PROLENE 2 0 CT2 30 (SUTURE) ×2 IMPLANT
SUT SILK 0 (SUTURE) ×1
SUT SILK 0 30XBRD TIE 6 (SUTURE) ×1 IMPLANT
SUT SURGIDAC NAB ES-9 0 48 120 (SUTURE) IMPLANT
SUT VIC AB 2-0 SH 27 (SUTURE)
SUT VIC AB 2-0 SH 27X BRD (SUTURE) IMPLANT
SUT VIC AB 4-0 SH 18 (SUTURE) ×2 IMPLANT
SYR 20CC LL (SYRINGE) ×4 IMPLANT
TOWEL OR 17X26 10 PK STRL BLUE (TOWEL DISPOSABLE) ×4 IMPLANT
TOWEL OR NON WOVEN STRL DISP B (DISPOSABLE) ×2 IMPLANT
TROCAR ADV FIXATION 12X100MM (TROCAR) ×4 IMPLANT
TROCAR ADV FIXATION 5X100MM (TROCAR) ×2 IMPLANT
TROCAR BLADELESS 15MM (ENDOMECHANICALS) ×2 IMPLANT
TROCAR BLADELESS OPT 5 100 (ENDOMECHANICALS) ×2 IMPLANT
TROCAR XCEL NON-BLD 11X100MML (ENDOMECHANICALS) IMPLANT
TROCAR XCEL UNIV SLVE 11M 100M (ENDOMECHANICALS) IMPLANT
TUBE CALIBRATION LAPBAND (TUBING) ×2 IMPLANT
TUBING INSUFFLATION 10FT LAP (TUBING) ×2 IMPLANT

## 2015-02-06 NOTE — H&P (View-Only) (Signed)
Jenna Pope 10/06/2014 10:29 AM Location: [facility] Patient #: [id number] DOB: 26-Nov-1970 Married / Language: English / Race: White Female  History of Present Illness  Patient words:interested in Lapband surgery  The patient is a 44 year old female who presents for a bariatric surgery evaluation. She is interested in laparoscopic adjustable gastric banding. She has a friend that had this procedure done several months ago and has done well. She has been to our informational seminar and I did mention sleeve gastrectomy and gastric bypass to her but she is interested in lap band surgery. She is followed by Dr. Birdie Riddle having been previously followed at [facility] in [facility]. In addtion, she has been treated for obesity by her OB Dr. Nori Riis who prescribed her with Phentermine.  Her weight today is 270 which gives her a BMI of 49. 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 She's had a child natural vaginal delivery. No prior abdominal surgery. She's had carpal tunnel surgery and breast reduction surgery back in the 90s. We gave her a booklet lap band as well as I talked her about the procedure in detail. She has been frustrated with her many weight loss attempts when include medically supervised diet and pharamacologic intervention. She would like to go ahead and pursue Lapband placement. Her preop workup included an UGI which did not show a hiatal hernia but did suggest hepatomegaly.  Ultrasound negative except for liver enlargement.    She was seen in the office January 27, 2015 for preop.       Other Problems Elbert Ewings, CMA; 10/06/2014 10:29 AM) Anxiety Disorder Arthritis Back Pain Gastroesophageal Reflux Disease High blood pressure Other disease, cancer, significant illness Sleep Apnea  Past Surgical History Elbert Ewings, CMA; 10/06/2014 10:29 AM) Breast  Biopsy Right. Mammoplasty; Reduction Bilateral. Oral Surgery  Diagnostic Studies History Elbert Ewings, [place]; 10/06/2014 10:29 AM) Colonoscopy never Mammogram within last year Pap Smear 1-5 years ago  Allergies Elbert Ewings, CMA; 10/06/2014 10:30 AM) No Known Drug Allergies02/25/2016  Medication History Elbert Ewings, CMA; 10/06/2014 10:33 AM) Losartan Potassium (100MG  Tablet, Oral daily) Active. Hydrochlorothiazide (25MG  Tablet, Oral daily) Active. Xanax (0.5MG  Tablet, Oral as needed) Active. Chantix (1MG  Tablet, Oral two times daily) Active. NexIUM (40MG  Packet, Oral daily) Active.  Social History Elbert Ewings, [place]; 10/06/2014 10:29 AM) Alcohol use Occasional alcohol use. Caffeine use Coffee, Tea. No drug use Tobacco use Current every day smoker.  Family History Elbert Ewings, [place]; 10/06/2014 10:29 AM) Alcohol Abuse Brother, Family Members In General, Father. Anesthetic complications Family Members In General. Arthritis Father, Mother. Breast Cancer Family Members In General. Cerebrovascular Accident Father. Colon Polyps Mother. Depression Brother, Father, Mother, Sister. Diabetes Mellitus Father. Heart Disease Father. Heart disease in female family member before age 9 Hypertension Family Members In General, Father, Mother. Ischemic Bowel Disease Mother. Kidney Disease Family Members In General. Migraine Headache Mother, Sister. Respiratory Condition Brother, Family Members In [place], Father.  Pregnancy / Birth History Elbert Ewings, CMA; 10/06/2014 10:29 AM) Age at menarche 4 years. Contraceptive History Intrauterine device, Oral contraceptives. Gravida 1 Irregular periods Maternal age 28-20 Para 1  Review of Systems Elbert Ewings CMA; 10/06/2014 10:29 AM) General Present- Fatigue and Weight Gain. Not Present- Appetite Loss, Chills, Fever, Night Sweats and Weight Loss. Skin Not Present- Change in Wart/Mole, Dryness, Hives, Jaundice, New  Lesions, Non-Healing Wounds, Rash and Ulcer. HEENT Present- Seasonal Allergies and Sinus Pain.  Not Present- Earache, Hearing Loss, Hoarseness, Nose Bleed, Oral Ulcers, Ringing in the Ears, Sore Throat, Visual Disturbances, Wears glasses/contact lenses and Yellow Eyes. Respiratory Present- Difficulty Breathing. Not Present- Bloody sputum, Chronic Cough, Snoring and Wheezing. Breast Not Present- Breast Mass, Breast Pain, Nipple Discharge and Skin Changes. Cardiovascular Present- Difficulty Breathing Lying Down, Leg Cramps and Swelling of Extremities. Not Present- Chest Pain, Palpitations, Rapid Heart Rate and Shortness of Breath. Gastrointestinal Not Present- Abdominal Pain, Bloating, Bloody Stool, Change in Bowel Habits, Chronic diarrhea, Constipation, Difficulty Swallowing, Excessive gas, Gets full quickly at meals, Hemorrhoids, Indigestion, Nausea, Rectal Pain and Vomiting. Female Genitourinary Not Present- Frequency, Nocturia, Painful Urination, Pelvic Pain and Urgency. Musculoskeletal Present- Back Pain and Joint Pain. Not Present- Joint Stiffness, Muscle Pain, Muscle Weakness and Swelling of Extremities. Neurological Not Present- Decreased Memory, Fainting, Headaches, Numbness, Seizures, Tingling, Tremor, Trouble walking and Weakness. Psychiatric Present- Anxiety. Not Present- Bipolar, Change in Sleep Pattern, Depression, Fearful and Frequent crying. Endocrine Present- Excessive Hunger. Not Present- Cold Intolerance, Hair Changes, Heat Intolerance, Hot flashes and New Diabetes. Hematology Not Present- Easy Bruising, Excessive bleeding, Gland problems, HIV and Persistent Infections.   Vitals Elbert Ewings CMA; 10/06/2014 10:34 AM) 10/06/2014 10:33 AM Weight: 270.4 lb Height: 62in Body Surface Area: 2.32 m Body Mass Index: 49.46 kg/m Temp.: 96.1F(Temporal)  Pulse: 82 (Regular)  Resp.: 17 (Unlabored)  BP: 132/78 (Sitting, Left Arm, Standard)    Physical Exam (Bassel Gaskill B. Hassell Done  MD; 10/06/2014 10:55 AM) The physical exam findings are as follows: Note:HEENT unremarkable Neck no goiter Chest clear Heart SR without murmurs Abdomen obese with large pannus displacing her legs laterally GU not examined Ext FROM but with knee pain from osteoarthritis    Assessment & Plan Rodman Key B. Hassell Done MD; 10/06/2014 10:59 AM) MORBID OBESITY (278.01  E66.01) Impression: For lapband placement.

## 2015-02-06 NOTE — Progress Notes (Signed)
Pt placed on nocturnal CPAP.  Pt using her ffm and tubing from home with machine from hospital.  Pt using setting of 12 cm H2O per home setting, with 2 Lpm of O2 being bled into circuit.  Machine plugged into red outlet and humidifier filled with sterile water.

## 2015-02-06 NOTE — Transfer of Care (Signed)
Immediate Anesthesia Transfer of Care Note  Patient: Jenna Pope  Procedure(s) Performed: Procedure(s): LAP BAND PLACEMENT (N/A)  Patient Location: PACU  Anesthesia Type:General  Level of Consciousness:  sedated, patient cooperative and responds to stimulation  Airway & Oxygen Therapy:Patient Spontanous Breathing and Patient connected to face mask oxgen  Post-op Assessment:  Report given to PACU RN and Post -op Vital signs reviewed and stable  Post vital signs:  Reviewed and stable  Last Vitals:  Filed Vitals:   02/06/15 0936  BP: 134/75  Pulse: 90  Temp: 36.8 C  Resp: 18    Complications: No apparent anesthesia complications

## 2015-02-06 NOTE — Op Note (Signed)
02/06/2015  Surgeon: Kaylyn Lim, MD, FACS Asst:  Alphonsa Overall, MD, FACS  Procedure: Laparoscopic adjustable gastric banding with Lapband APS  Anes:  General  EBL:  Minimal  Description of Procedure  The patient was taken to OR # 1 and given general anesthesia.  After a prep with PCMX the patient was draped and a timeout performed.  Access to the abdomen was achieved with a 0 degree Optiview technique through the left upper quadrant.    Adhesions were minimal.  Ports were placed to the the right of the midline including a 15 trocar in  the right upper quadrant placed obliquely.  The Sherrie Sport was used to retract the left lateral segment and the peritoneum was incised along the left crus.   The EJ junction as assessed for a hiatus hernia and no hiatus hernia noted.  A balloon test was negative.    The pars flaccida technique was utilized to insert the blunt "finger " dissector from right to left behind the stomach.  This created a target zone to pass the band passer.     The lapband APS  Had been previously flushed and was inserted through the 15 trocar.  It was placed in the tip of "the finger"  and pulled around behind the stomach.   The band was plicated with 3 free sutures of Surgidek secured with TyKnots.  The tubing was brought out through the lower incision on the right and connected to the port which had mesh sewn onto the back and was placed into the subcutaneous pocket.  The incisions were injected with Exparel and closed with 4-0 vicryl and Liquiban.     The patient was taken to the PACU in stable condition.    Matt B. Hassell Done, [place], [facility], [place]

## 2015-02-06 NOTE — Anesthesia Procedure Notes (Signed)
Procedure Name: Intubation Date/Time: 02/06/2015 11:40 AM Performed by: Maxwell Caul Pre-anesthesia Checklist: Patient identified, Emergency Drugs available, Suction available and Patient being monitored Patient Re-evaluated:Patient Re-evaluated prior to inductionOxygen Delivery Method: Circle System Utilized Preoxygenation: Pre-oxygenation with 100% oxygen Intubation Type: IV induction Ventilation: Mask ventilation without difficulty Laryngoscope Size: Mac and 4 Grade View: Grade I Tube type: Oral Tube size: 7.5 mm Number of attempts: 1 Airway Equipment and Method: Stylet and Oral airway Placement Confirmation: ETT inserted through vocal cords under direct vision,  positive ETCO2 and breath sounds checked- equal and bilateral Secured at: 21 cm Tube secured with: Tape Dental Injury: Teeth and Oropharynx as per pre-operative assessment

## 2015-02-06 NOTE — Interval H&P Note (Signed)
History and Physical Interval Note:  02/06/2015 11:15 AM  Jenna Pope  has presented today for surgery, with the diagnosis of MORBID OBESITY  The various methods of treatment have been discussed with the patient and family. After consideration of risks, benefits and other options for treatment, the patient has consented to  Procedure(s): LAP BAND PLACEMENT (N/A) as a surgical intervention .  The patient's history has been reviewed, patient examined, no change in status, stable for surgery.  I have reviewed the patient's chart and labs.  Questions were answered to the patient's satisfaction.  Will proceed with placement of a Lapband    [name]

## 2015-02-06 NOTE — Anesthesia Postprocedure Evaluation (Signed)
Anesthesia Post Note  Patient: Jenna Pope  Procedure(s) Performed: Procedure(s) (LRB): LAP BAND PLACEMENT (N/A)  Anesthesia type: general  Patient location: PACU  Post pain: Pain level controlled  Post assessment: Patient's Cardiovascular Status Stable  Last Vitals:  Filed Vitals:   02/06/15 1410  BP: 148/80  Pulse: 90  Temp:   Resp: 23    Post vital signs: Reviewed and stable  Level of consciousness: sedated  Complications: No apparent anesthesia complications

## 2015-02-06 NOTE — Anesthesia Preprocedure Evaluation (Addendum)
Anesthesia Evaluation  Patient identified by MRN, date of birth, ID band Patient awake    Reviewed: Allergy & Precautions, NPO status , Patient's Chart, lab work & pertinent test results  History of Anesthesia Complications Negative for: history of anesthetic complications  Airway Mallampati: II  TM Distance: >3 FB Neck ROM: Full    Dental  (+) Teeth Intact, Dental Advisory Given   Pulmonary sleep apnea and Continuous Positive Airway Pressure Ventilation , Current Smoker,    Pulmonary exam normal       Cardiovascular hypertension, Normal cardiovascular exam    Neuro/Psych PSYCHIATRIC DISORDERS Anxiety negative neurological ROS     GI/Hepatic Neg liver ROS, GERD-  ,  Endo/Other  Hypothyroidism   Renal/GU negative Renal ROS     Musculoskeletal   Abdominal   Peds  Hematology   Anesthesia Other Findings   Reproductive/Obstetrics                            Anesthesia Physical Anesthesia Plan  ASA: III  Anesthesia Plan: General   Post-op Pain Management:    Induction: Intravenous  Airway Management Planned: Oral ETT  Additional Equipment:   Intra-op Plan:   Post-operative Plan: Extubation in OR  Informed Consent: I have reviewed the patients History and Physical, chart, labs and discussed the procedure including the risks, benefits and alternatives for the proposed anesthesia with the patient or authorized representative who has indicated his/her understanding and acceptance.   Dental advisory given  Plan Discussed with: CRNA, Anesthesiologist and Surgeon  Anesthesia Plan Comments:        Anesthesia Quick Evaluation

## 2015-02-07 ENCOUNTER — Observation Stay (HOSPITAL_COMMUNITY): Payer: BLUE CROSS/BLUE SHIELD

## 2015-02-07 ENCOUNTER — Encounter (HOSPITAL_COMMUNITY): Payer: Self-pay | Admitting: Surgery

## 2015-02-07 LAB — CBC WITH DIFFERENTIAL/PLATELET
Basophils Absolute: 0 10*3/uL (ref 0.0–0.1)
Basophils Relative: 0 % (ref 0–1)
Eosinophils Absolute: 0 10*3/uL (ref 0.0–0.7)
Eosinophils Relative: 0 % (ref 0–5)
HCT: 37.3 % (ref 36.0–46.0)
Hemoglobin: 12.9 g/dL (ref 12.0–15.0)
Lymphocytes Relative: 11 % — ABNORMAL LOW (ref 12–46)
Lymphs Abs: 1.6 10*3/uL (ref 0.7–4.0)
MCH: 30.9 pg (ref 26.0–34.0)
MCHC: 34.6 g/dL (ref 30.0–36.0)
MCV: 89.2 fL (ref 78.0–100.0)
MONOS PCT: 5 % (ref 3–12)
Monocytes Absolute: 0.7 10*3/uL (ref 0.1–1.0)
NEUTROS ABS: 12.3 10*3/uL — AB (ref 1.7–7.7)
NEUTROS PCT: 84 % — AB (ref 43–77)
PLATELETS: 257 10*3/uL (ref 150–400)
RBC: 4.18 MIL/uL (ref 3.87–5.11)
RDW: 13 % (ref 11.5–15.5)
WBC: 14.6 10*3/uL — ABNORMAL HIGH (ref 4.0–10.5)

## 2015-02-07 MED ORDER — NICOTINE 7 MG/24HR TD PT24: 7.0000 mg | MEDICATED_PATCH | Freq: Every day | TRANSDERMAL | Status: DC

## 2015-02-07 MED ORDER — IOHEXOL 300 MG/ML  SOLN
20.0000 mL | Freq: Once | INTRAMUSCULAR | Status: AC | PRN
  Administered 2015-02-07: 20 mL via ORAL

## 2015-02-07 NOTE — Progress Notes (Signed)
Patient is alert and oriented.  Pain is controlled, and patient is tolerating fluids.  Advanced to protein shake today, patient tolerated well.  Reviewed Adjustable gastric band discharge instructions with patient, patient able to articulate understanding.  Provided information on BELT program, Support Group and [facility] outpatient pharmacy. All questions answered, will continue to monitor.

## 2015-02-07 NOTE — Discharge Instructions (Signed)
° °                ° °ADJUSTABLE GASTRIC BAND ° Home Care Instructions ° ° These instructions are to help you care for yourself when you go home. ° °Call: If you have any problems. °• Call [phone] and ask for the surgeon on call °• If you need immediate assistance come to the ER at [facility]. Tell the ER staff you are a new post-op gastric banding patient  °Signs and symptoms to report: • Severe  vomiting or nausea °o If you cannot handle clear liquids for longer than 1 day, call your surgeon °• Abdominal pain which does not get better after taking your pain medication °• Fever greater than 100.4°  F and chills °• Heart rate over 100 beats a minute °• Trouble breathing °• Chest pain °• Redness,  swelling, drainage, or foul odor at incision (surgical) sites °• If your incisions open or pull apart °• Swelling or pain in calf (lower leg) °• Diarrhea (Loose bowel movements that happen often), frequent watery, uncontrolled bowel movements °• Constipation, (no bowel movements for 3 days) if this happens: °o Take Milk of Magnesia, 2 tablespoons by mouth, 3 times a day for 2 days if needed °o Stop taking Milk of Magnesia once you have had a bowel movement °o Call your doctor if constipation continues °Or °o Take Miralax  (instead of Milk of Magnesia) following the label instructions °o Stop taking Miralax once you have had a bowel movement °o Call your doctor if constipation continues °• Anything you think is “abnormal for you” °  °Normal side effects after surgery: • Unable to sleep at night or unable to concentrate °• Irritability °• Being tearful (crying) or depressed ° °These are common complaints, possibly related to your anesthesia, stress of surgery, and change in lifestyle, that usually go away a few weeks after surgery. If these feelings continue, call your medical doctor.  °Wound Care: You may have surgical glue, steri-strips, or staples over your incisions after surgery °• Surgical glue: Looks like clear  film over your incisions and will wear off a little at a time °• Steri-strips: Adhesive strips of tape over your incisions. You may notice a yellowish color on skin under the steri-strips. This is used to make the steri-strips stick better. Do not pull the steri-strips off - let them fall off °• Staples: Staples may be removed before you leave the hospital °o If you go home with staples, call [facility] for an appointment with your surgeon’s nurse to have staples removed 10 days after surgery, [phone] °• Showering: You may shower two (2) days after your surgery unless your surgeon tells you differently °o Wash gently around incisions with warm soapy water, rinse well, and gently pat dry °o If you have a drain (tube from your incision), you may need someone to hold this while you shower °o No tub baths until staples are removed and incisions are healed °  °Medications: • Medications should be liquid or crushed if larger than the size of a dime °• Extended release pills (medication that releases a little bit at a time through the  day) should not be crushed °• Depending on the size and number of medications you take, you may need to space (take a few throughout the day)/change the time you take your medications so that you do not over-fill your pouch (smaller stomach) °• Make sure you follow-up with you primary care physician   to make medication changes needed during rapid weight loss and life -style changes °• If you have diabetes, follow up with your doctor that orders your diabetes medication(s) within one week after surgery and check your blood sugar regularly ° °• Do not drive while taking narcotics (pain medications) ° °• Do not take acetaminophen (Tylenol) and Roxicet or Lortab Elixir at the same time since these pain medications contain acetaminophen °  °Diet:  °First 2 Weeks You will see the nutritionist about two (2) weeks after your surgery. The nutritionist will increase the types of  foods you can eat if you are handling liquids well: °• If you have severe vomiting or nausea and cannot handle clear liquids lasting longer than 1 day call your surgeon °For Same Day Surgery Discharge Patients: °• The day of surgery drink water only: 2 ounces every 4 hours °• If you are handling water, start drinking your high protein shake the next morning °For Overnight Stay Patients: °• Begin by drinking 2 ounces of a high protein every 3 hours, 5-6 times per day °• Slowly increase the amount you drink as tolerated °• You may find it easier to slowly sip shakes throughout the day. It is important to get your proteins in first °  ° Protein Shake °• Drink at least 2 ounces of shake 5-6 times per day °• Each serving of protein shakes (usually 8-12 ounces) should have a minimum of: °o 15 grams of protein °o And no more than 5 grams of carbohydrate °• Goal for protein each day: °o Men = 80 grams per day °o Women = 60 grams per day °• Protein powder may be added to fluids such as non-fat milk or Lactaid milk or Soy milk (limit to 35 grams added protein powder per serving) ° °Hydration °• Slowly increase the amount of water and other clear liquids as tolerated (See Acceptable Fluids) °• Slowly increase the amount of protein shake as tolerated °• Sip fluids slowly and throughout the day °• May use sugar substitutes in small amounts (no more than 6-8 packets per day; i.e. Splenda) ° °Fluid Goal °• The first goal is to drink at least 8 ounces of protein shake/drink per day (or as directed by the nutritionist); some examples of protein shakes are Syntrax, Nectar, Adkins Advantage, EAS Edge HP, and Unjury. - See handout from pre-op Bariatric Education Class: °o Slowly increase the amount of protein shake you drink as tolerated °o You may find it easier to slowly sip shakes throughout the day °o It is important to get your proteins in first °• Your fluid goal is to drink 64-100 ounces of fluid daily °o It may take a few weeks  to build up to this  °• 32 oz. (or more) should be full liquids (see below for examples) °• Liquids should not contain sugar, caffeine, or carbonation ° °Clear Liquids: °• Water of Sugar-free flavored water (i.e. Fruit H²O, Propel) °• Decaffeinated coffee or tea (sugar-free) °• Crystal lite, Wyler’s Lite, Minute Maid Lite °• Sugar-free Jell-O °• Bouillon or broth °• Sugar-free Popsicle:    - Less than 20 calories each; Limit 1 per day ° ° ° ° °  ° Full Liquids: °                  Protein Shakes/Drinks + 2 choices per day of other full liquids °• Full liquids must be: °o No More Than 12 grams of Carbs per serving °o No More Than 3 grams   of Fat per serving °• Strained low-fat cream soup °• Non-Fat milk °• Fat-free Lactaid Milk °• Sugar-free yogurt (Dannon Lite & Fit, Greek yogurt) °  °Vitamins and Minerals • Start 1 day after surgery unless otherwise directed by your surgeon °• 1 Chewable Multivitamin / Multimineral Supplement with iron (i.e. Centrum for Adults) °• Chewable Calcium Citrate with Vitamin D-3 °(Example: 3 Chewable Calcium  Plus 600 with vitamin D-3) °o Take 500 mg three (3) times a day for a total of 1500 mg per day °o Do not take all 3 doses of calcium at one time as it may cause constipation, and you can only absorb 500 mg at a time °o Do not mix multivitamins containing iron with calcium supplements;  take 2 hours apart °o Do not substitute Tums (calcium carbonate) for your calcium °• Menstruating women and those at risk for anemia ( a blood disease that causes weakness) may need extra iron °o Talk to your doctor to see if you need more iron °• If you need extra iron: total daily iron recommendation (including Vitamins) is 50 to 100 mg Iron/day °• Do not stop taking or change any vitamins or minerals until you talk to your nutritionist or surgeon °• Your nutritionist and/or surgeon must approve all vitamin and mineral supplements  °Activity and Exercise: It is important to continue walking at home.  Limit your physical activity as instructed by your doctor. During this time, use these guidelines: °• Do not lift anything greater than ten  (10) pounds for at least two (2) weeks °• Do not go back to work or drive until your surgeon says you can °• You may have sex when you feel comfortable °o It is VERY important for female patients to use a reliable birth control method; fertility often increase after surgery °o Do not get pregnant for at least 18 months °• Start exercising as soon as your doctor tells you that you can °o Make sure your doctor approves any physical activity °• Start with a simple walking program °• Walk 5-15 minutes each day, 7 days per week °• Slowly increase until you are walking 30-45 minutes per day °• Consider joining our BELT program. [phone] or email [email or web address] ° °  °Special Instructions  Things to remember: °• Free counseling is available for you and your family through collaboration between [facility] and [employer]. Please call [phone] and leave a message °• Use your CPAP when sleeping if this applies to you °• Consider buying a medical alert bracelet that says you had lap-band surgery °• You will likely have your first fill (fluid added to your band) 6 - 8 weeks after surgery °• [facility] has a free Bariatric Surgery Support Group that meets monthly, the 3rd Thursday, 6pm. [facility] Classrooms. You can see classes online at [email or web address] °• It is very important to keep all follow up appointments with your surgeon, nutritionist, primary care physician, and behavioral health practitioner °o After the first year, please follow up with your bariatric surgeon and nutritionist at least once a year in order to maintain best weight loss results °      °             [facility]:  [phone] ° °             [facility]: [phone] ° °             Bariatric Nurse Coordinator:   [phone] °  °   Adjustable Gastric Band Home Care Instructions  Rev. 09/2012                                                            ° °     Reviewed and Endorsed °                                                  by [facility] Patient Education Committee, Jan, 2014 ° °

## 2015-02-07 NOTE — Discharge Summary (Signed)
Physician Discharge Summary  Patient ID: Jenna Pope MRN: [id number] DOB/AGE: 44-May-1972 44 y.o.  Admit date: 02/06/2015 Discharge date: 02/07/2015  Admission Diagnoses:  Morbid obesity  Discharge Diagnoses:  same  Principal Problem:   Lapband APS June 2016 Active Problems:   Status post gastric banding   Surgery:  lapband APS   Discharged Condition: improved  Hospital Course:   Had surgery.  Kept overnight.  UGI OK.  Ready for discharge  Consults: none  Significant Diagnostic Studies: UGI    Discharge Exam: Blood pressure 125/60, pulse 75, temperature 97.8 F (36.6 C), temperature source Oral, resp. rate 20, height 5\' 1"  (1.549 m), weight 119.069 kg (262 lb 8 oz), last menstrual period 01/14/2015, SpO2 99 %. Incisions OK.   Disposition: 01-Home or Self Care  Discharge Instructions    Ambulate hourly while awake    Complete by:  As directed      Call MD for:  difficulty breathing, headache or visual disturbances    Complete by:  As directed      Call MD for:  persistant dizziness or light-headedness    Complete by:  As directed      Call MD for:  persistant nausea and vomiting    Complete by:  As directed      Call MD for:  redness, tenderness, or signs of infection (pain, swelling, redness, odor or green/yellow discharge around incision site)    Complete by:  As directed      Call MD for:  severe uncontrolled pain    Complete by:  As directed      Call MD for:  temperature >101 F    Complete by:  As directed      Diet bariatric full liquid    Complete by:  As directed      Discharge instructions    Complete by:  As directed   Follow bariatric lapband diet instructions     Incentive spirometry    Complete by:  As directed   Perform hourly while awake            Medication List    STOP taking these medications        azithromycin 250 MG tablet  Commonly known as:  ZITHROMAX     potassium chloride SA 20 MEQ tablet  Commonly known as:  K-DUR,KLOR-CON      TAKE these medications        albuterol 108 (90 BASE) MCG/ACT inhaler  Commonly known as:  PROVENTIL HFA;VENTOLIN HFA  Inhale 2 puffs into the lungs every 6 (six) hours as needed for wheezing or shortness of breath.     ALPRAZolam 0.5 MG tablet  Commonly known as:  XANAX  Take 1 tablet (0.5 mg total) by mouth 2 (two) times daily as needed for anxiety.     benzonatate 100 MG capsule  Commonly known as:  TESSALON  Take 1 capsule (100 mg total) by mouth 3 (three) times daily as needed for cough.     esomeprazole 40 MG capsule  Commonly known as:  NEXIUM  Take 40 mg by mouth daily as needed (acid reflux).     fluticasone 50 MCG/ACT nasal spray  Commonly known as:  FLONASE  Place 2 sprays into both nostrils daily.     hydrochlorothiazide 12.5 MG tablet  Commonly known as:  HYDRODIURIL  Take 1 tablet (12.5 mg total) by mouth daily.  Notes to Patient:  Monitor for signs of dehydration, may need to contact PCP for  changes to medication dosage with rapid weight loss     ibuprofen 200 MG tablet  Commonly known as:  ADVIL,MOTRIN  Take 400 mg by mouth every 6 (six) hours as needed for moderate pain.     loratadine 10 MG tablet  Commonly known as:  CLARITIN  Take 1 tablet (10 mg total) by mouth daily.     losartan 50 MG tablet  Commonly known as:  COZAAR  Take 1 tablet (50 mg total) by mouth daily.     multivitamin tablet  Take 1 tablet by mouth daily.     nicotine 7 mg/24hr patch  Commonly known as:  NICODERM CQ - dosed in mg/24 hr  Place 1 patch (7 mg total) onto the skin daily.     thyroid 65 MG tablet  Commonly known as:  ARMOUR  Take 65 mg by mouth every morning.     TURMERIC PO  Take 1 tablet by mouth at bedtime.     varenicline 1 MG tablet  Commonly known as:  CHANTIX CONTINUING MONTH PAK  Take 1 tablet (1 mg total) by mouth 2 (two) times daily.     varenicline 0.5 MG X 11 & 1 MG X 42 tablet  Commonly known as:  CHANTIX STARTING MONTH PAK  One 0.5 mg tab once  daily x3 days, then one 0.5 mg tab BID x4 days, then one 1 mg tab BID           Follow-up Information    Follow up with Pedro Earls, MD. Daphane Shepherd on 02/17/2015.   Specialty:  General Surgery   Why:  For Post-Op Check at 11:45 AM   Contact information:   [place] [address] [phone]       Signed: Pedro Earls 02/07/2015, 10:45 AM

## 2015-02-07 NOTE — Progress Notes (Signed)
Discharge instructions given to patient. Questions answered 

## 2015-02-09 ENCOUNTER — Telehealth (HOSPITAL_COMMUNITY): Payer: Self-pay

## 2015-02-09 NOTE — Telephone Encounter (Signed)
Made discharge phone call to patient per DROP protocol. Asking the following questions.    1. Do you have someone to care for you now that you are home?  yes 2. Are you having pain now that is not relieved by your pain medication?  no 3. Are you able to drink the recommended daily amount of fluids (48 ounces minimum/day) and protein (60-80 grams/day) as prescribed by the dietitian or nutritional counselor?  I am getting there, I am close 4. Are you taking the vitamins and minerals as prescribed?  yes 5. Do you have the "on call" number to contact your surgeon if you have a problem or question?  yes 6. Are your incisions free of redness, swelling or drainage? (If steri strips, address that these can fall off, shower as tolerated) no 7. Have your bowels moved since your surgery?  If not, are you passing gas?  yes 8. Are you up and walking 3-4 times per day?  Yes, when I am not nauseated    1. Do you have an appointment made to see your surgeon in the next month?  yes 2. Were you provided your discharge medications before your surgery or before you were discharged from the hospital and are you taking them without problem?  yes 3. Were you provided phone numbers to the clinic/surgeon's office?  yes 4. Did you watch the patient education video module in the (clinic, surgeon's office, etc.) before your surgery? yes 5. Do you have a discharge checklist that was provided to you in the hospital to reference with instructions on how to take care of yourself after surgery?  yes 6. Did you see a dietitian or nutritional counselor while you were in the hospital?  yes 7. Do you have an appointment to see a dietitian or nutritional counselor in the next month? yes   "I have been nauseated, yesterday every time I moved I felt nauseous."  I instructed the patient to contact [facility] to discuss nausea, possible get Rx for antiemetic.

## 2015-02-10 ENCOUNTER — Ambulatory Visit (HOSPITAL_COMMUNITY)
Admission: RE | Admit: 2015-02-10 | Discharge: 2015-02-10 | Disposition: A | Discharge status: Home / Self Care | Payer: BLUE CROSS/BLUE SHIELD | Source: Ambulatory Visit | Attending: Surgery | Admitting: Surgery

## 2015-02-10 ENCOUNTER — Other Ambulatory Visit: Payer: Self-pay

## 2015-02-10 DIAGNOSIS — R1310 Dysphagia, unspecified: Secondary | ICD-10-CM | POA: Insufficient documentation

## 2015-02-10 DIAGNOSIS — R112 Nausea with vomiting, unspecified: Secondary | ICD-10-CM

## 2015-02-10 DIAGNOSIS — R1314 Dysphagia, pharyngoesophageal phase: Secondary | ICD-10-CM

## 2015-02-10 NOTE — Addendum Note (Signed)
Addended by: Johnathan Hausen on: 02/10/2015 03:11 PM   Modules accepted: Orders

## 2015-02-21 ENCOUNTER — Encounter: Payer: BLUE CROSS/BLUE SHIELD | Attending: Surgery

## 2015-02-21 DIAGNOSIS — Z713 Dietary counseling and surveillance: Secondary | ICD-10-CM | POA: Insufficient documentation

## 2015-02-21 DIAGNOSIS — Z6841 Body Mass Index (BMI) 40.0 and over, adult: Secondary | ICD-10-CM | POA: Diagnosis not present

## 2015-02-21 NOTE — Progress Notes (Signed)
Bariatric Class:  Appt start time: 1530 end time:  1630.  2 Week Post-Operative Nutrition Class  Patient was seen on 02/21/15 for [facility]ative [facility]   Surgery date: 02/06/2015 Surgery type: LAGB Start weight at [facility]: 274 lbs on 11/12/14 Weight today: 244.0 lbs  Weight change: 24.5 lbs  TANITA  BODY COMP RESULTS  12/05/14 02/21/15   BMI (kg/m^2) 49.9 45.4   Fat Mass (lbs) 134.5 125.5   Fat Free Mass (lbs) 134 118.5   Total Body Water (lbs) 98 87.0    The following the learning objectives were met by the patient during this course:  Identifies Phase 3A (Soft, High Proteins) Dietary Goals and will begin from 2 weeks post-operatively to 2 months post-operatively  Identifies appropriate sources of fluids and proteins   States protein recommendations and appropriate sources post-operatively  Identifies the need for appropriate texture modifications, mastication, and bite sizes when consuming solids  Identifies appropriate multivitamin and calcium sources post-operatively  Describes the need for physical activity post-operatively and will follow MD recommendations  States when to call healthcare provider regarding medication questions or post-operative complications  Handouts given during class include:  Phase 3A: Soft, High Protein Diet Handout  Follow-Up Plan: Patient will follow-up at [facility] in 6 weeks for 2 month post-op nutrition visit for diet advancement per MD.

## 2015-03-13 ENCOUNTER — Other Ambulatory Visit: Payer: Self-pay | Admitting: Family Medicine

## 2015-03-13 MED ORDER — ALPRAZOLAM 0.5 MG PO TABS: 0.5000 mg | ORAL_TABLET | Freq: Two times a day (BID) | ORAL | Status: DC | PRN

## 2015-03-13 NOTE — Telephone Encounter (Signed)
Last OV 09/06/14 Alprazolam last filled 01/02/15 #90 with 0

## 2015-03-13 NOTE — Telephone Encounter (Signed)
Med filled and faxed.  

## 2015-04-07 ENCOUNTER — Encounter: Payer: Self-pay | Admitting: Dietician

## 2015-04-07 ENCOUNTER — Encounter: Payer: BLUE CROSS/BLUE SHIELD | Attending: Surgery | Admitting: Dietician

## 2015-04-07 DIAGNOSIS — Z6841 Body Mass Index (BMI) 40.0 and over, adult: Secondary | ICD-10-CM | POA: Diagnosis not present

## 2015-04-07 DIAGNOSIS — Z713 Dietary counseling and surveillance: Secondary | ICD-10-CM | POA: Diagnosis not present

## 2015-04-07 NOTE — Patient Instructions (Addendum)
Goals:  Follow Phase 3B: High Protein + Non-Starchy Vegetables  Eat 3-6 small meals/snacks, every 3-5 hrs  Increase lean protein foods to meet 60g goal  Increase fluid intake to 64oz +  Avoid drinking 15 minutes before, during and 30 minutes after eating  Aim for >30 min of physical activity daily  Avoid crackers, pretzels, and other carbs  Stick to 15 grams of carbs or less per meal  Try keeping a food log   TANITA  BODY COMP RESULTS  12/05/14 02/21/15 04/07/15   BMI (kg/m^2) 49.9 45.4 44.9   Fat Mass (lbs) 134.5 125.5 119   Fat Free Mass (lbs) 134 118.5 122.5   Total Body Water (lbs) 98 87.0 89.5

## 2015-04-07 NOTE — Progress Notes (Signed)
  Follow-up visit:  8 Weeks Post-Operative LAGB Surgery  Medical Nutrition Therapy:  Appt start time: 0940 end time:  1010.  Primary concerns today: Post-operative Bariatric Surgery Nutrition Management. Had some nausea at first, resolved with Phenergan. Has trouble tolerating chicken, especially if it is dry. Tolerating other meats like beef and [place]. Has resumed caffeinated coffee. Can tolerate 4-5 oz of meat at a time. Had one fill in August and cannot feel any restriction yet. Has added some vegetables.  Surgery date: 02/06/2015 Surgery type: LAGB Start weight at [facility]: 274 lbs on 11/12/14 Weight today: 241.5 lbs Weight change: 2.5 lbs Total weight lost: 32.5 lbs  TANITA  BODY COMP RESULTS  12/05/14 02/21/15 04/07/15   BMI (kg/m^2) 49.9 45.4 44.9   Fat Mass (lbs) 134.5 125.5 119   Fat Free Mass (lbs) 134 118.5 122.5   Total Body Water (lbs) 98 87.0 89.5    Preferred Learning Style:   No preference indicated   Learning Readiness:   Ready  24-hr recall: B (AM): Premier protein shake (30g) Snk (AM): cheese and crackers OR Triple Zero (7-15g)  L (PM): hamburger patty or [place] burger or tomato basil soup from cafeteria at work (7-28g) Snk (PM): see morning snack (7-15g)  D (PM): chicken or pork roast (28g) Snk (PM): hummus and pretzel chips or wheat crackers  Fluid intake: water and protein shake (50-60 oz total) Estimated total protein intake: 87-108 g  Medications: see list; stopped taking everything except blood pressure medication Supplementation: taking  Using straws: no Drinking while eating: yes Hair loss: none Carbonated beverages: none N/V/D/C: vomiting 1x after eating too fast Dumping syndrome: no Last Lap-Band fill: 1 fill in August 2016  Recent physical activity:  Walking, plans to increase walking and stationary biking  Progress Towards Goal(s):  In progress.  Handouts given during visit include:  Phase 3B lean protein + non starchy  vegetables   Nutritional Diagnosis:  Brandon-3.3 Overweight/obesity related to past poor dietary habits and physical inactivity as evidenced by patient w/ recent LAGB surgery following dietary guidelines for continued weight loss.     Intervention:  Nutrition counseling.  Teaching Method Utilized:  Visual Auditory Hands on  Barriers to learning/adherence to lifestyle change: none  Demonstrated degree of understanding via:  Teach Back   Monitoring/Evaluation:  Dietary intake, exercise, lap band fills, and body weight. Follow up in 2 months for 4 month post-op visit.

## 2015-04-12 ENCOUNTER — Encounter: Payer: Self-pay | Admitting: Family Medicine

## 2015-04-12 DIAGNOSIS — Z9989 Dependence on other enabling machines and devices: Secondary | ICD-10-CM

## 2015-04-12 DIAGNOSIS — G4733 Obstructive sleep apnea (adult) (pediatric): Secondary | ICD-10-CM

## 2015-04-18 NOTE — Telephone Encounter (Signed)
Referral placed.

## 2015-05-19 ENCOUNTER — Encounter: Payer: Self-pay | Admitting: Family Medicine

## 2015-05-19 ENCOUNTER — Other Ambulatory Visit: Payer: Self-pay

## 2015-05-19 ENCOUNTER — Other Ambulatory Visit: Payer: Self-pay | Admitting: Family Medicine

## 2015-05-19 DIAGNOSIS — Z9989 Dependence on other enabling machines and devices: Secondary | ICD-10-CM

## 2015-05-19 DIAGNOSIS — G4733 Obstructive sleep apnea (adult) (pediatric): Secondary | ICD-10-CM

## 2015-05-19 NOTE — Telephone Encounter (Signed)
Please advise.//AB/CMA 

## 2015-05-22 ENCOUNTER — Telehealth: Payer: Self-pay | Admitting: *Deleted

## 2015-05-22 MED ORDER — ALPRAZOLAM 0.5 MG PO TABS: 0.5000 mg | ORAL_TABLET | Freq: Two times a day (BID) | ORAL | Status: DC | PRN

## 2015-05-22 NOTE — Telephone Encounter (Signed)
Ok for #90, 1 refill 

## 2015-05-22 NOTE — Telephone Encounter (Signed)
Request for refill on: Alprazolam Last Rx: 08.01.16, #90x1 Last OV: 01.26.16 [Acute Only w/Edward 03.21.16] Return: 2 Mths., no future appt scheduled Please advise on refills/SLS

## 2015-05-22 NOTE — Telephone Encounter (Signed)
Rx request phoned to pharmacy/SLS  

## 2015-07-12 ENCOUNTER — Encounter: Payer: Self-pay | Admitting: Family Medicine

## 2015-07-12 NOTE — Telephone Encounter (Signed)
Last OV 09/06/14 Alprazolam last filled 05/22/15 #90 with 1  Ok to fill?

## 2015-07-12 NOTE — Telephone Encounter (Signed)
Pt states she was never made aware of pulmonary appt with [facility]. Can we see if we can get her rescheduled?

## 2015-07-13 MED ORDER — ALPRAZOLAM 0.5 MG PO TABS: 0.5000 mg | ORAL_TABLET | Freq: Two times a day (BID) | ORAL | Status: DC | PRN

## 2015-07-13 NOTE — Addendum Note (Signed)
Addended by: Midge Minium on: 07/13/2015 08:12 AM   Modules accepted: Orders

## 2015-11-09 ENCOUNTER — Encounter: Payer: Self-pay | Admitting: Family Medicine

## 2015-11-10 ENCOUNTER — Encounter: Payer: Self-pay | Admitting: Family Medicine

## 2015-11-10 ENCOUNTER — Telehealth: Payer: Self-pay | Admitting: Family Medicine

## 2015-11-10 MED ORDER — ALPRAZOLAM 0.5 MG PO TABS: 0.5000 mg | ORAL_TABLET | Freq: Two times a day (BID) | ORAL | Status: DC | PRN

## 2015-11-10 NOTE — Telephone Encounter (Signed)
Relation to [id number] Call back number:[phone] Pharmacy: [facility] [address] [place] [phone] (Phone) [phone] (Fax)         Reason for call:  Patient requesting a refill ALPRAZolam (XANAX) 0.5 MG tablet. Patient would like a response thru My Chart please advise

## 2015-11-13 ENCOUNTER — Encounter: Payer: Self-pay | Admitting: General Practice

## 2015-11-13 NOTE — Telephone Encounter (Signed)
Last OV 09/06/14 Alprazolam last filled 11/10/15 #90 with 1  Doesn't pt need an appt first?

## 2015-11-13 NOTE — Telephone Encounter (Signed)
Yes- please have pt schedule a visit

## 2015-11-30 DIAGNOSIS — G4733 Obstructive sleep apnea (adult) (pediatric): Secondary | ICD-10-CM | POA: Diagnosis not present

## 2015-12-22 ENCOUNTER — Ambulatory Visit (INDEPENDENT_AMBULATORY_CARE_PROVIDER_SITE_OTHER): Payer: BLUE CROSS/BLUE SHIELD | Admitting: Family Medicine

## 2015-12-22 ENCOUNTER — Encounter: Payer: Self-pay | Admitting: Family Medicine

## 2015-12-22 VITALS — BP 127/73 | HR 63 | Temp 98.6°F | Resp 16 | Ht 62.0 in | Wt 241.2 lb

## 2015-12-22 DIAGNOSIS — Z0000 Encounter for general adult medical examination without abnormal findings: Secondary | ICD-10-CM | POA: Insufficient documentation

## 2015-12-22 DIAGNOSIS — Z1283 Encounter for screening for malignant neoplasm of skin: Secondary | ICD-10-CM

## 2015-12-22 LAB — CBC WITH DIFFERENTIAL/PLATELET
BASOS PCT: 0.3 % (ref 0.0–3.0)
Basophils Absolute: 0 10*3/uL (ref 0.0–0.1)
EOS ABS: 0.1 10*3/uL (ref 0.0–0.7)
EOS PCT: 0.7 % (ref 0.0–5.0)
HEMATOCRIT: 45 % (ref 36.0–46.0)
HEMOGLOBIN: 15.5 g/dL — AB (ref 12.0–15.0)
LYMPHS PCT: 25.5 % (ref 12.0–46.0)
Lymphs Abs: 3.9 10*3/uL (ref 0.7–4.0)
MCHC: 34.5 g/dL (ref 30.0–36.0)
MCV: 89.2 fl (ref 78.0–100.0)
MONOS PCT: 6.2 % (ref 3.0–12.0)
Monocytes Absolute: 1 10*3/uL (ref 0.1–1.0)
Neutro Abs: 10.3 10*3/uL — ABNORMAL HIGH (ref 1.4–7.7)
Neutrophils Relative %: 67.3 % (ref 43.0–77.0)
Platelets: 282 10*3/uL (ref 150.0–400.0)
RBC: 5.05 Mil/uL (ref 3.87–5.11)
RDW: 13.1 % (ref 11.5–15.5)
WBC: 15.4 10*3/uL — AB (ref 4.0–10.5)

## 2015-12-22 LAB — HEPATIC FUNCTION PANEL
ALT: 20 U/L (ref 0–35)
AST: 18 U/L (ref 0–37)
Albumin: 4.3 g/dL (ref 3.5–5.2)
Alkaline Phosphatase: 68 U/L (ref 39–117)
BILIRUBIN TOTAL: 0.5 mg/dL (ref 0.2–1.2)
Bilirubin, Direct: 0.1 mg/dL (ref 0.0–0.3)
Total Protein: 7.4 g/dL (ref 6.0–8.3)

## 2015-12-22 LAB — BASIC METABOLIC PANEL
BUN: 13 mg/dL (ref 6–23)
CALCIUM: 10.1 mg/dL (ref 8.4–10.5)
CO2: 27 mEq/L (ref 19–32)
CREATININE: 0.61 mg/dL (ref 0.40–1.20)
Chloride: 102 mEq/L (ref 96–112)
GFR: 112.91 mL/min (ref >60.00)
Glucose, Bld: 87 mg/dL (ref 70–99)
Potassium: 4.1 mEq/L (ref 3.5–5.1)
Sodium: 137 mEq/L (ref 135–145)

## 2015-12-22 LAB — TSH: TSH: 2.25 u[IU]/mL (ref 0.35–4.50)

## 2015-12-22 LAB — VITAMIN D 25 HYDROXY (VIT D DEFICIENCY, FRACTURES): VITD: 14.36 ng/mL — ABNORMAL LOW (ref 30.00–100.00)

## 2015-12-22 LAB — LDL CHOLESTEROL, DIRECT: LDL DIRECT: 130 mg/dL

## 2015-12-22 LAB — LIPID PANEL
CHOL/HDL RATIO: 7
Cholesterol: 188 mg/dL (ref 0–200)
HDL: 26.4 mg/dL — AB (ref >39.00)
NONHDL: 162.05
TRIGLYCERIDES: 208 mg/dL — AB (ref 0.0–149.0)
VLDL: 41.6 mg/dL — AB (ref 0.0–40.0)

## 2015-12-22 MED ORDER — LOSARTAN POTASSIUM 100 MG PO TABS: 100.0000 mg | ORAL_TABLET | Freq: Every day | ORAL | Status: DC

## 2015-12-22 NOTE — Progress Notes (Signed)
   Subjective:    Patient ID: Jenna Pope, female    DOB: 12/14/1970, 45 y.o.   MRN: [id number]  HPI CPE- UTD on GYN ([facility]).  S/p lap band surgery.   Review of Systems Patient reports no vision/ hearing changes, adenopathy,fever, weight change,  persistant/recurrent hoarseness , swallowing issues, chest pain, palpitations, edema, persistant/recurrent cough, hemoptysis, dyspnea (rest/exertional/paroxysmal nocturnal), gastrointestinal bleeding (melena, rectal bleeding), abdominal pain, significant heartburn, bowel changes, GU symptoms (dysuria, hematuria, incontinence), Gyn symptoms (abnormal  bleeding, pain),  syncope, focal weakness, memory loss, numbness & tingling, skin/hair/nail changes, abnormal bruising or bleeding, anxiety, or depression.     Objective:   Physical Exam General Appearance:    Alert, cooperative, no distress, appears stated age, obese  Head:    Normocephalic, without obvious abnormality, atraumatic  Eyes:    PERRL, conjunctiva/corneas clear, EOM's intact, fundi    benign, both eyes  Ears:    Normal TM's and external ear canals, both ears  Nose:   Nares normal, septum midline, mucosa normal, no drainage    or sinus tenderness  Throat:   Lips, mucosa, and tongue normal; teeth and gums normal  Neck:   Supple, symmetrical, trachea midline, no adenopathy;    Thyroid: no enlargement/tenderness/nodules  Back:     Symmetric, no curvature, ROM normal, no CVA tenderness  Lungs:     Clear to auscultation bilaterally, respirations unlabored  Chest Wall:    No tenderness or deformity   Heart:    Regular rate and rhythm, S1 and S2 normal, no murmur, rub   or gallop  Breast Exam:    Deferred to GYN  Abdomen:     Soft, non-tender, bowel sounds active all four quadrants,    no masses, no organomegaly  Genitalia:    Deferred to GYN  Rectal:    Extremities:   Extremities normal, atraumatic, no cyanosis or edema  Pulses:   2+ and symmetric all extremities  Skin:   Skin color,  texture, turgor normal, no rashes or lesions  Lymph nodes:   Cervical, supraclavicular, and axillary nodes normal  Neurologic:   CNII-XII intact, normal strength, sensation and reflexes    throughout          Assessment & Plan:

## 2015-12-22 NOTE — Patient Instructions (Signed)
Follow up in 6 months to recheck BP We'll notify you of your lab results and make any changes if needed Continue to work on healthy diet and regular exercise- you can do it! Ask Dr Hassell Done about Qsymia for weight loss Schedule your appt w/ Dr Nori Riis Try and quit smoking! Call with any questions or concerns Happy Mother's Day!!!

## 2015-12-22 NOTE — Progress Notes (Signed)
Pre visit review using our clinic review tool, if applicable. No additional management support is needed unless otherwise documented below in the visit note. 

## 2015-12-22 NOTE — Assessment & Plan Note (Signed)
Pt's PE WNL w/ exception of obesity.  Pt to schedule GYN appt/mammo.  Stressed need for healthy diet and regular exercise.  Check labs.  Anticipatory guidance provided.

## 2015-12-24 ENCOUNTER — Encounter: Payer: Self-pay | Admitting: Family Medicine

## 2015-12-25 ENCOUNTER — Other Ambulatory Visit: Payer: Self-pay | Admitting: General Practice

## 2015-12-25 ENCOUNTER — Other Ambulatory Visit: Payer: Self-pay | Admitting: Family Medicine

## 2015-12-25 DIAGNOSIS — D72829 Elevated white blood cell count, unspecified: Secondary | ICD-10-CM

## 2015-12-25 MED ORDER — VITAMIN D (ERGOCALCIFEROL) 1.25 MG (50000 UNIT) PO CAPS: 50000.0000 [IU] | ORAL_CAPSULE | ORAL | Status: DC

## 2016-02-09 DIAGNOSIS — D1801 Hemangioma of skin and subcutaneous tissue: Secondary | ICD-10-CM | POA: Diagnosis not present

## 2016-02-09 DIAGNOSIS — D235 Other benign neoplasm of skin of trunk: Secondary | ICD-10-CM | POA: Diagnosis not present

## 2016-02-09 DIAGNOSIS — L814 Other melanin hyperpigmentation: Secondary | ICD-10-CM | POA: Diagnosis not present

## 2016-02-09 DIAGNOSIS — D485 Neoplasm of uncertain behavior of skin: Secondary | ICD-10-CM | POA: Diagnosis not present

## 2016-03-04 DIAGNOSIS — H6121 Impacted cerumen, right ear: Secondary | ICD-10-CM | POA: Diagnosis not present

## 2016-03-04 DIAGNOSIS — H7291 Unspecified perforation of tympanic membrane, right ear: Secondary | ICD-10-CM | POA: Diagnosis not present

## 2016-03-11 DIAGNOSIS — I10 Essential (primary) hypertension: Secondary | ICD-10-CM | POA: Diagnosis not present

## 2016-03-11 DIAGNOSIS — J302 Other seasonal allergic rhinitis: Secondary | ICD-10-CM | POA: Diagnosis not present

## 2016-03-11 DIAGNOSIS — E669 Obesity, unspecified: Secondary | ICD-10-CM | POA: Diagnosis not present

## 2016-03-11 DIAGNOSIS — H7291 Unspecified perforation of tympanic membrane, right ear: Secondary | ICD-10-CM | POA: Diagnosis not present

## 2016-03-25 DIAGNOSIS — I10 Essential (primary) hypertension: Secondary | ICD-10-CM | POA: Diagnosis not present

## 2016-03-25 DIAGNOSIS — H7291 Unspecified perforation of tympanic membrane, right ear: Secondary | ICD-10-CM | POA: Diagnosis not present

## 2016-03-29 ENCOUNTER — Other Ambulatory Visit: Payer: Self-pay | Admitting: General Practice

## 2016-03-29 ENCOUNTER — Encounter: Payer: Self-pay | Admitting: Family Medicine

## 2016-03-29 MED ORDER — ALPRAZOLAM 0.5 MG PO TABS
0.5000 mg | ORAL_TABLET | Freq: Two times a day (BID) | ORAL | 0 refills | Status: DC | PRN
Start: 2016-03-29 — End: 2016-07-01

## 2016-06-25 IMAGING — RF DG UGI W/ GASTROGRAFIN
9 series · 9 of 9 positions shown · IV contrast (omnipaque)
Comparison: None.

CLINICAL DATA: Postop gastric band

EXAM:
WATER SOLUBLE UPPER GI SERIES
TECHNIQUE: Single-column upper GI series was performed using water soluble
contrast.
CONTRAST:  20mL OMNIPAQUE IOHEXOL 300 MG/ML  SOLN

[Series 1: run · 1 of 1 slices shown (1 of 9)]
[im 1/1]
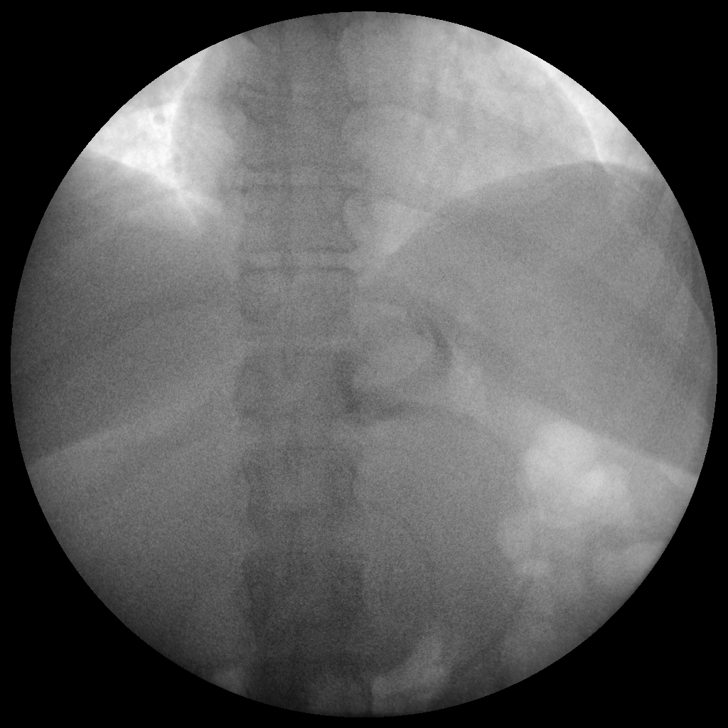

[Series 2: run · 1 of 1 slices shown (2 of 9)]
[im 1/1]
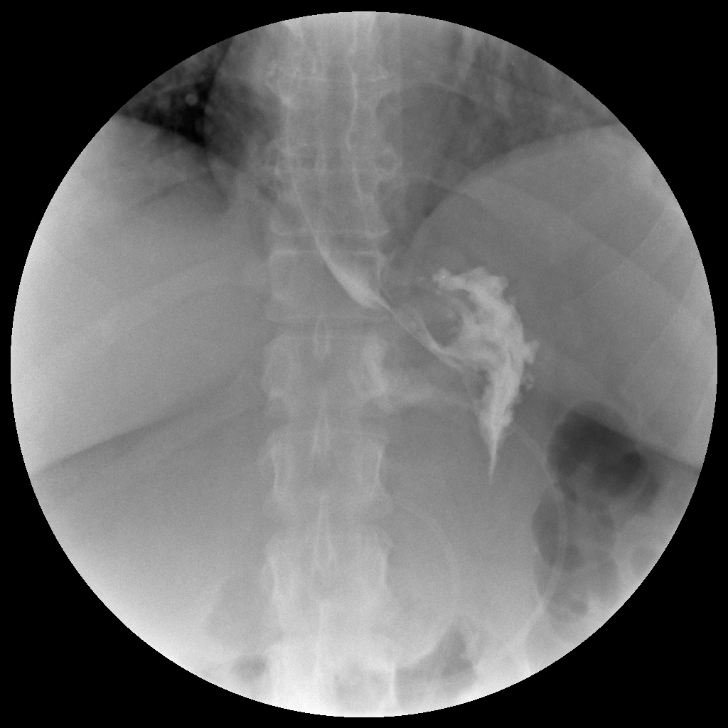

[Series 3: run · 1 of 1 slices shown (3 of 9)]
[im 1/1]
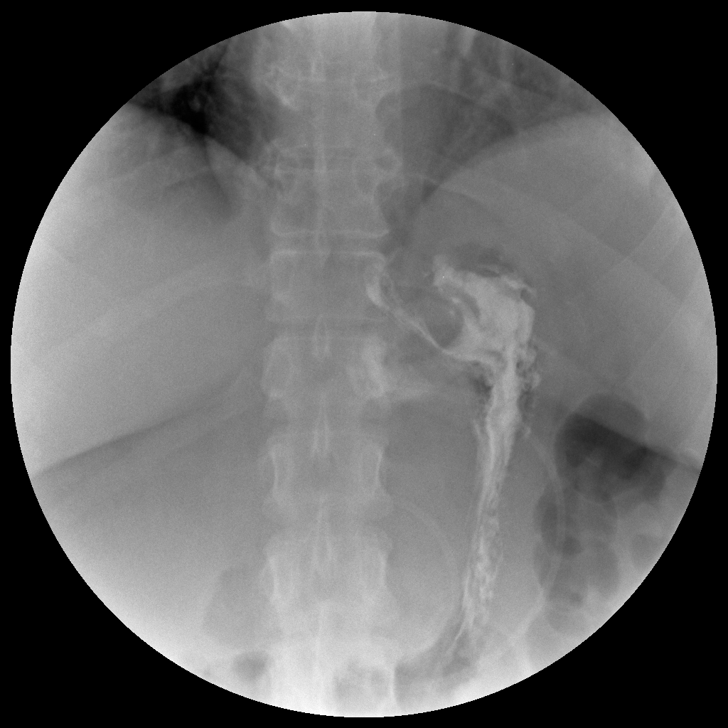

[Series 4: run · 1 of 1 slices shown (4 of 9)]
[im 1/1]
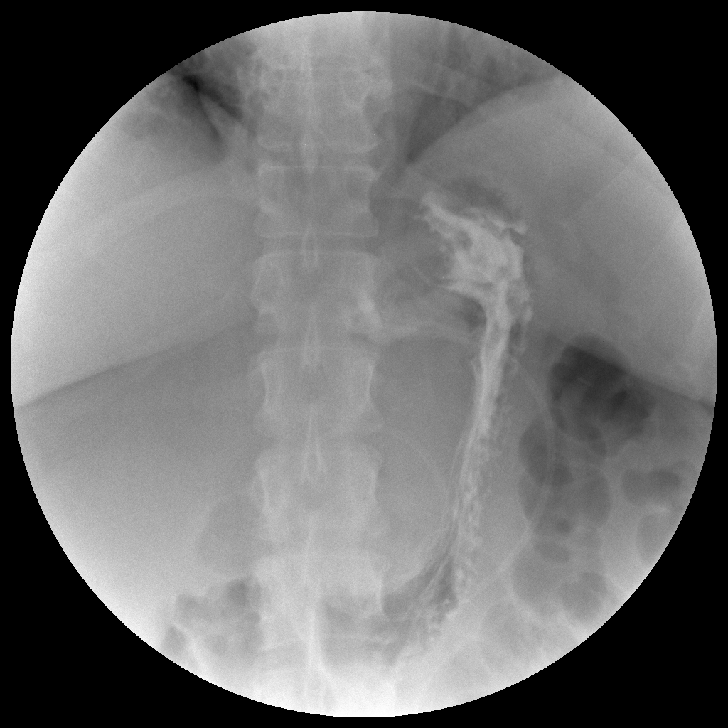

[Series 5: run · 1 of 1 slices shown (5 of 9)]
[im 1/1]
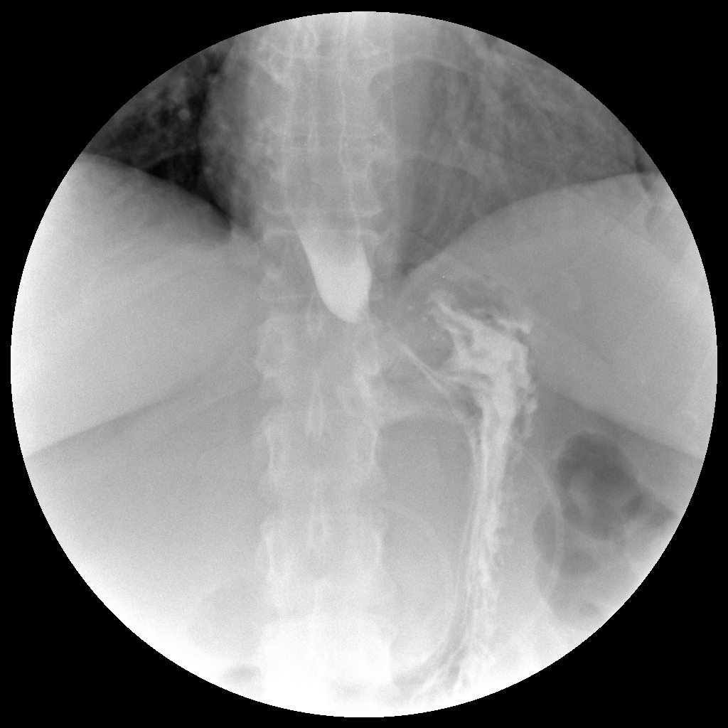

[Series 6: run · 1 of 1 slices shown (6 of 9)]
[im 1/1]
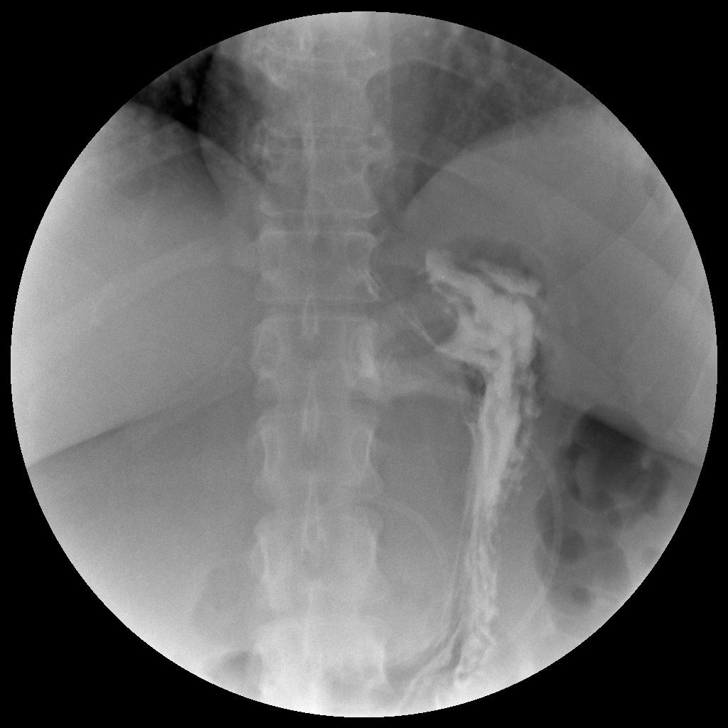

[Series 7: run · 1 of 1 slices shown (7 of 9)]
[im 1/1]
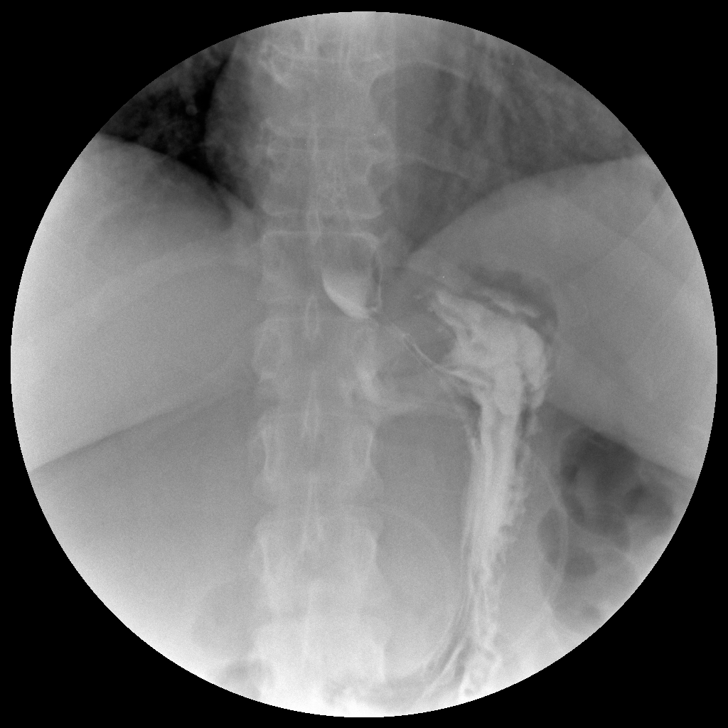

[Series 8: run · 1 of 1 slices shown (8 of 9)]
[im 1/1]
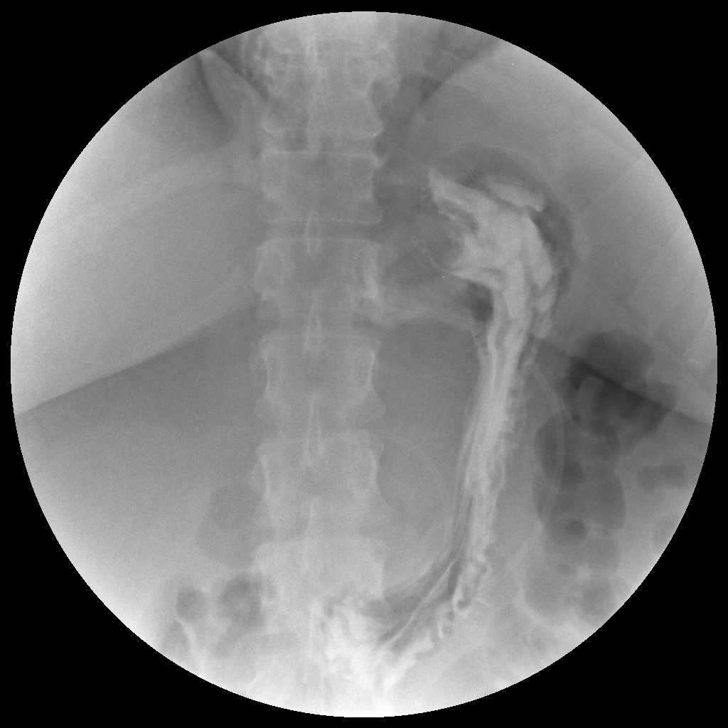

[Series 9: run · 1 of 1 slices shown (9 of 9)]
[im 1/1]
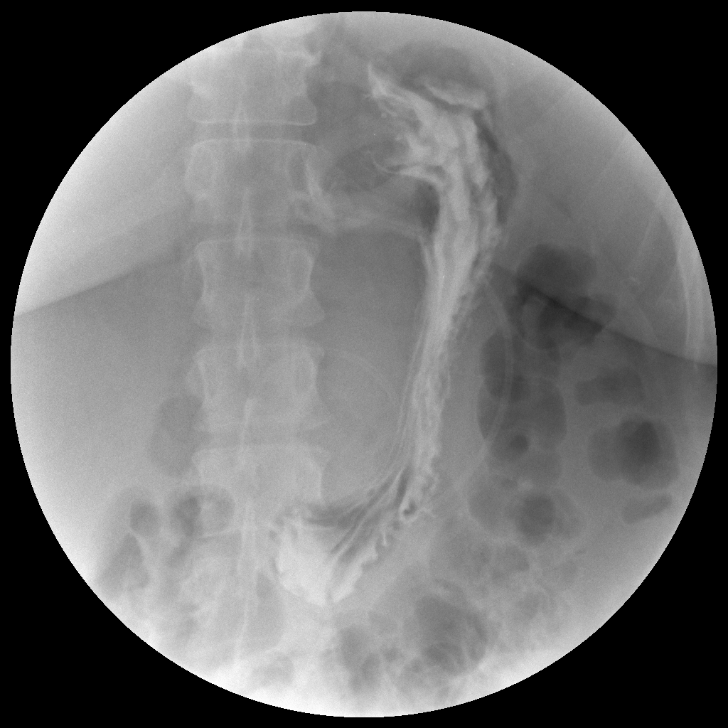

[9 of 9 positions shown; findings below may reference images not displayed]

FLUOROSCOPY TIME:  Fluoroscopy Time (in minutes and seconds): 41
seconds

Number of Acquired Images:  9
FINDINGS: Scout radiograph demonstrates a laparoscopic band in satisfactory
position.

Normal esophageal peristalsis.

Mild narrowing without restriction at the laparoscopic band.

Normal gastric folds.
IMPRESSION: Satisfactory appearance status post laparoscopic band.

## 2016-06-28 ENCOUNTER — Ambulatory Visit: Payer: Self-pay | Admitting: Family Medicine

## 2016-07-01 ENCOUNTER — Ambulatory Visit (INDEPENDENT_AMBULATORY_CARE_PROVIDER_SITE_OTHER): Payer: BLUE CROSS/BLUE SHIELD | Admitting: Family Medicine

## 2016-07-01 ENCOUNTER — Encounter: Payer: Self-pay | Admitting: Family Medicine

## 2016-07-01 ENCOUNTER — Encounter: Payer: Self-pay | Admitting: General Practice

## 2016-07-01 VITALS — BP 127/83 | HR 96 | Temp 98.1°F | Resp 16 | Ht 62.0 in | Wt 247.1 lb

## 2016-07-01 DIAGNOSIS — E039 Hypothyroidism, unspecified: Secondary | ICD-10-CM

## 2016-07-01 DIAGNOSIS — I10 Essential (primary) hypertension: Secondary | ICD-10-CM

## 2016-07-01 DIAGNOSIS — Z79899 Other long term (current) drug therapy: Secondary | ICD-10-CM | POA: Diagnosis not present

## 2016-07-01 LAB — HEPATIC FUNCTION PANEL
ALT: 14 U/L (ref 0–35)
AST: 13 U/L (ref 0–37)
Albumin: 4.1 g/dL (ref 3.5–5.2)
Alkaline Phosphatase: 66 U/L (ref 39–117)
BILIRUBIN DIRECT: 0.1 mg/dL (ref 0.0–0.3)
BILIRUBIN TOTAL: 0.4 mg/dL (ref 0.2–1.2)
Total Protein: 6.7 g/dL (ref 6.0–8.3)

## 2016-07-01 LAB — TSH: TSH: 2.88 u[IU]/mL (ref 0.35–4.50)

## 2016-07-01 LAB — BASIC METABOLIC PANEL
BUN: 8 mg/dL (ref 6–23)
CHLORIDE: 104 meq/L (ref 96–112)
CO2: 28 meq/L (ref 19–32)
CREATININE: 0.56 mg/dL (ref 0.40–1.20)
Calcium: 9.6 mg/dL (ref 8.4–10.5)
GFR: 124.32 mL/min (ref >60.00)
GLUCOSE: 80 mg/dL (ref 70–99)
POTASSIUM: 4.1 meq/L (ref 3.5–5.1)
Sodium: 139 mEq/L (ref 135–145)

## 2016-07-01 LAB — LIPID PANEL
CHOL/HDL RATIO: 5
CHOLESTEROL: 177 mg/dL (ref 0–200)
HDL: 34.2 mg/dL — AB (ref >39.00)
LDL Cholesterol: 109 mg/dL — ABNORMAL HIGH (ref 0–99)
NonHDL: 142.47
TRIGLYCERIDES: 168 mg/dL — AB (ref 0.0–149.0)
VLDL: 33.6 mg/dL (ref 0.0–40.0)

## 2016-07-01 LAB — CBC WITH DIFFERENTIAL/PLATELET
BASOS PCT: 0.4 % (ref 0.0–3.0)
Basophils Absolute: 0.1 10*3/uL (ref 0.0–0.1)
EOS ABS: 0.2 10*3/uL (ref 0.0–0.7)
EOS PCT: 1.1 % (ref 0.0–5.0)
HEMATOCRIT: 45.4 % (ref 36.0–46.0)
HEMOGLOBIN: 15.4 g/dL — AB (ref 12.0–15.0)
LYMPHS PCT: 24.9 % (ref 12.0–46.0)
Lymphs Abs: 3.7 10*3/uL (ref 0.7–4.0)
MCHC: 34 g/dL (ref 30.0–36.0)
MCV: 89.6 fl (ref 78.0–100.0)
MONOS PCT: 6 % (ref 3.0–12.0)
Monocytes Absolute: 0.9 10*3/uL (ref 0.1–1.0)
Neutro Abs: 10 10*3/uL — ABNORMAL HIGH (ref 1.4–7.7)
Neutrophils Relative %: 67.6 % (ref 43.0–77.0)
Platelets: 264 10*3/uL (ref 150.0–400.0)
RBC: 5.07 Mil/uL (ref 3.87–5.11)
RDW: 12.8 % (ref 11.5–15.5)
WBC: 14.8 10*3/uL — AB (ref 4.0–10.5)

## 2016-07-01 LAB — HEMOGLOBIN A1C: Hgb A1c MFr Bld: 5.1 % (ref 4.6–6.5)

## 2016-07-01 MED ORDER — ALPRAZOLAM 0.5 MG PO TABS: 0.5000 mg | ORAL_TABLET | Freq: Two times a day (BID) | ORAL | 0 refills | Status: DC | PRN

## 2016-07-01 NOTE — Progress Notes (Signed)
   Subjective:    Patient ID: Jenna Pope, female    DOB: Jan 02, 1971, 45 y.o.   MRN: [id number]  HPI HTN- chronic problem, on HCTZ, Losartan daily w/ adequate BP control.  No CP, SOB, HAs, visual changes, edema.  Hypothyroid- chronic problem, no longer on Armour Thyroid.  Not feeling fatigued, no changes to skin/hair/nails w/ exception of eczema.  No changes to bowel or bladder habits  Obesity- pt has gained weight (6 lbs) despite gastric banding 2016.  BMI now 45.  Pt is not getting any regular exercise.  Pt is asking for prescription to gym membership   Review of Systems For ROS see HPI     Objective:   Physical Exam  Constitutional: She is oriented to person, place, and time. She appears well-developed and well-nourished. No distress.  obese  HENT:  Head: Normocephalic and atraumatic.  Eyes: Conjunctivae and EOM are normal. Pupils are equal, round, and reactive to light.  Neck: Normal range of motion. Neck supple. No thyromegaly present.  Cardiovascular: Normal rate, regular rhythm, normal heart sounds and intact distal pulses.   No murmur heard. Pulmonary/Chest: Effort normal and breath sounds normal. No respiratory distress.  Abdominal: Soft. She exhibits no distension. There is no tenderness.  Musculoskeletal: She exhibits no edema.  Lymphadenopathy:    She has no cervical adenopathy.  Neurological: She is alert and oriented to person, place, and time.  Skin: Skin is warm and dry.  Psychiatric: She has a normal mood and affect. Her behavior is normal.  Vitals reviewed.         Assessment & Plan:

## 2016-07-01 NOTE — Assessment & Plan Note (Signed)
Chronic problem.  Pt stopped her medication.  Currently asymptomatic.  Check labs.  Restart meds prn.

## 2016-07-01 NOTE — Assessment & Plan Note (Signed)
Ongoing issue for pt.  She gained weight despite the gastric banding.  Stressed need for healthy diet and regular exercise- prescription given for gym membership to use her HSA.  Check labs to risk stratify.  Will follow.

## 2016-07-01 NOTE — Assessment & Plan Note (Signed)
Chronic problem.  Adequate control.  Asymptomatic at this time.  Check labs.  No anticipated med changes. 

## 2016-07-01 NOTE — Progress Notes (Signed)
Pre visit review using our clinic review tool, if applicable. No additional management support is needed unless otherwise documented below in the visit note. 

## 2016-07-01 NOTE — Patient Instructions (Signed)
Schedule your complete physical in 6 months We'll notify you of your lab results and make any changes if needed Continue to work on healthy diet and regular exercise- you can do it! Call with any questions or concerns Happy Holidays!! 

## 2016-07-02 ENCOUNTER — Other Ambulatory Visit: Payer: Self-pay | Admitting: Family Medicine

## 2016-07-02 DIAGNOSIS — D72829 Elevated white blood cell count, unspecified: Secondary | ICD-10-CM

## 2016-07-25 ENCOUNTER — Encounter: Payer: Self-pay | Admitting: Family Medicine

## 2016-08-09 ENCOUNTER — Ambulatory Visit (HOSPITAL_BASED_OUTPATIENT_CLINIC_OR_DEPARTMENT_OTHER): Payer: BLUE CROSS/BLUE SHIELD

## 2016-08-09 ENCOUNTER — Ambulatory Visit (HOSPITAL_BASED_OUTPATIENT_CLINIC_OR_DEPARTMENT_OTHER): Payer: BLUE CROSS/BLUE SHIELD | Admitting: Hematology & Oncology

## 2016-08-09 ENCOUNTER — Other Ambulatory Visit (HOSPITAL_BASED_OUTPATIENT_CLINIC_OR_DEPARTMENT_OTHER): Payer: BLUE CROSS/BLUE SHIELD

## 2016-08-09 VITALS — BP 143/83 | HR 98 | Temp 98.0°F | Resp 18 | Wt 255.8 lb

## 2016-08-09 DIAGNOSIS — D72829 Elevated white blood cell count, unspecified: Secondary | ICD-10-CM | POA: Diagnosis not present

## 2016-08-09 DIAGNOSIS — Z803 Family history of malignant neoplasm of breast: Secondary | ICD-10-CM

## 2016-08-09 DIAGNOSIS — D72823 Leukemoid reaction: Secondary | ICD-10-CM

## 2016-08-09 DIAGNOSIS — Z9884 Bariatric surgery status: Secondary | ICD-10-CM

## 2016-08-09 DIAGNOSIS — Z720 Tobacco use: Secondary | ICD-10-CM | POA: Diagnosis not present

## 2016-08-09 LAB — TECHNOLOGIST REVIEW CHCC SATELLITE: Tech Review: 1

## 2016-08-09 LAB — CBC WITH DIFFERENTIAL (CANCER CENTER ONLY)
BASO#: 0 10*3/uL (ref 0.0–0.2)
BASO%: 0.3 % (ref 0.0–2.0)
EOS ABS: 0.2 10*3/uL (ref 0.0–0.5)
EOS%: 1.6 % (ref 0.0–7.0)
HCT: 43.8 % (ref 34.8–46.6)
HEMOGLOBIN: 15.1 g/dL (ref 11.6–15.9)
LYMPH#: 3.4 10*3/uL — ABNORMAL HIGH (ref 0.9–3.3)
LYMPH%: 30.8 % (ref 14.0–48.0)
MCH: 30.8 pg (ref 26.0–34.0)
MCHC: 34.5 g/dL (ref 32.0–36.0)
MCV: 89 fL (ref 81–101)
MONO#: 0.7 10*3/uL (ref 0.1–0.9)
MONO%: 6.5 % (ref 0.0–13.0)
NEUT%: 60.8 % (ref 39.6–80.0)
NEUTROS ABS: 6.6 10*3/uL — AB (ref 1.5–6.5)
Platelets: 241 10*3/uL (ref 145–400)
RBC: 4.91 10*6/uL (ref 3.70–5.32)
RDW: 12.7 % (ref 11.1–15.7)
WBC: 10.9 10*3/uL — ABNORMAL HIGH (ref 3.9–10.0)

## 2016-08-09 LAB — CHCC SATELLITE - SMEAR

## 2016-08-09 NOTE — Progress Notes (Signed)
Referral MD  Reason for Referral: Mild leukocytosis-leukemoid reaction   Chief Complaint  Patient presents with  . New Evaluation  : White cells are high.  HPI: Jenna Pope is a very charming 45 year old white female. She recent had a gastric bypass. Actually, he performed a lap band placement. This was back in June 2016.  She is a smoker. She has a 30 year history of tobacco use. She probably has about a pack per day use.  She has been noted to have some leukocytosis. She is followed by Dr. Birdie Riddle. Going back through the record, her white cell count has been elevated for a while. In 2014, her white cell count was 11.4. Her hemoglobin was 14.7 and platelet count 288,000. In 2016, her white cell count was 13.6. Hemoglobin 14.4 and platelet count 296,000. In June 2016, her white count was 15.8. In May of this should, her white cell count was 15.4. Hemoglobin 15.5 and platelet count of 282,000.  Of note, with all the elevated white cell counts, she had a normal white cell differential.  She's not noted any rashes. She's had no cough. Her last chest x-ray was back in March 2016. The x-ray was normal.  She's not noted any palpable lymph glands.  His been no weight gain or weight loss. She's had no change in medications. She has had occasional steroid injections into her knees.  She rarely drinks alcohol. She is not a vegetarian. She works for [employer] in the [place].  She's had no rashes.  She has a 45 year old. There were no problems with pregnancy.  Overall, her performance status is ECOG 0.  There is no family history of any blood problems. There is an aunt that had breast cancer.  We were kindly asked to see her to see if anything needs to be done for the mild leukocytosis.    Past Medical History:  Diagnosis Date  . Anxiety   . Arthritis    knees   . Carpal tunnel syndrome   . Family history of anesthesia complication    had an aunt cardiac arrest during a knee surgery   . GERD (gastroesophageal reflux disease)   . Hypertension   . Hypothyroidism   . Shortness of breath    with exertion   . Sleep apnea    uses cpap since 2/13  setting at 12   :  Past Surgical History:  Procedure Laterality Date  . BREAST REDUCTION SURGERY    . CARPAL TUNNEL RELEASE  04/15/2012   Procedure: CARPAL TUNNEL RELEASE;  Surgeon: Wynonia Sours, MD;  Location: [facility];  Service: Orthopedics;  Laterality: Right;  . LAPAROSCOPIC GASTRIC BANDING N/A 02/06/2015   Procedure: LAP BAND PLACEMENT;  Surgeon: Johnathan Hausen, MD;  Location: [facility];  Service: General;  Laterality: N/A;  . merina birth control surgical removal   2015   . OVARIAN CYST REMOVAL    . UPPER GASTROINTESTINAL ENDOSCOPY    . WISDOM TOOTH EXTRACTION    :   Current Outpatient Prescriptions:  .  cholecalciferol (VITAMIN D) 1000 units tablet, Take 5,000 Units by mouth daily., Disp: , Rfl:  .  ALPRAZolam (XANAX) 0.5 MG tablet, Take 1 tablet (0.5 mg total) by mouth 2 (two) times daily as needed for anxiety., Disp: 90 tablet, Rfl: 0 .  esomeprazole (NEXIUM) 40 MG capsule, Take 40 mg by mouth daily as needed (acid reflux). , Disp: , Rfl:  .  hydrochlorothiazide (HYDRODIURIL) 12.5 MG tablet, Take 1  tablet (12.5 mg total) by mouth daily., Disp: 90 tablet, Rfl: 3 .  ibuprofen (ADVIL,MOTRIN) 200 MG tablet, Take 400 mg by mouth every 6 (six) hours as needed for moderate pain., Disp: , Rfl:  .  loratadine (CLARITIN) 10 MG tablet, Take 1 tablet (10 mg total) by mouth daily., Disp: 30 tablet, Rfl: 0 .  losartan (COZAAR) 100 MG tablet, Take 1 tablet (100 mg total) by mouth daily., Disp: 90 tablet, Rfl: 1 .  Multiple Vitamin (MULTIVITAMIN) tablet, Take 1 tablet by mouth daily., Disp: , Rfl:  .  nicotine (NICODERM CQ - DOSED IN MG/24 HR) 7 mg/24hr patch, Place 1 patch (7 mg total) onto the skin daily. (Patient not taking: Reported on 07/01/2016), Disp: 28 patch, Rfl: 0 .  TURMERIC PO, Take 1 tablet by mouth at  bedtime., Disp: , Rfl: :  :  Allergies  Allergen Reactions  . Ace Inhibitors     Cough + feels like she has a lump in her throat.   :  Family History  Problem Relation Age of Onset  . Arthritis Mother   . Alcohol abuse Mother   . Hyperlipidemia Father   . Heart disease Father   . Stroke Father   . Hypertension Father   . Cancer Son   . Arthritis Maternal Aunt   . Cancer Maternal Uncle   . Alcohol abuse Maternal Grandfather   :  Social History   Social History  . Marital status: Married    Spouse name: N/A  . Number of children: N/A  . Years of education: N/A   Occupational History  . Not on file.   Social History Main Topics  . Smoking status: Current Every Day Smoker    Packs/day: 0.50    Years: 20.00  . Smokeless tobacco: Never Used  . Alcohol use Yes     Comment: rare  . Drug use: No  . Sexual activity: Not on file   Other Topics Concern  . Not on file   Social History Narrative  . No narrative on file  :  Pertinent items are noted in HPI.  Exam: @[facility] @ Obese white female in no obvious distress. Vital signs showed temperature of 98. Pulse 98. Blood pressure 143/83. Weight is 256 pounds. Head and neck exam shows no ocular or oral lesions. She has no scleral icterus. She has no adenopathy in the neck. Thyroid is nonpalpable. Lungs are clear bilaterally. She has no rales or wheezes. Cardiac exam regular rate and rhythm with no murmurs, rubs or bruits. Abdomen is soft. She has good bowel sounds. There is no fluid wave. There is no palpable liver or spleen tip. Back exam shows no tenderness over the spine, ribs or hips. Extremities shows no clubbing, cyanosis or edema. Neurological exam shows no focal neurological deficit. Skin exam shows numerous hyperpigmented lesions. She has some keratoses. She has some nevi. I do not see anything that is unusual.  Recent Labs  08/09/16 1331  WBC 10.9*  HGB 15.1  HCT 43.8  PLT 241   No results for input(s): NA,  K, CL, CO2, GLUCOSE, BUN, CREATININE, CALCIUM in the last 72 hours.  Blood smear review:  Normochromic and normocytic palpation of red blood cells. She has no nucleated red blood cells. There are no teardrop cells. She has no inclusion bodies. White cells are minimally elevated in number. White cells are mature. I see no hypersegmented polys. She has no immature myeloid or lymphoid forms. Platelets are adequate in number  and size. Platelets are well granulated.  Pathology: None     Assessment and Plan:  Jenna Pope is a very charming 45 year old white female. She has mild leukocytosis. 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 I see nothing on her blood smear that looks like an underlying bone marrow disorder. I don't see anything that suggest a myeloproliferative disorder.  I suspect that the leukocytosis might be from her tobacco use. It is not common to see a mildly elevated white cell count and women who smoke. She had a chest x-ray just a year and a half ago. I don't think she needs to have another chest x-ray. She has no pulmonary symptoms.  I don't see need for a bone marrow test.  I don't see anything on her medicine list that would cause the white cells being slowly elevated.  Sometimes, obesity might be able to cause a slight increase in white blood cells.  I think we can follow her along. I would like to see her back in 4 months. If all is good in 4 months, then we can let her go from the clinic.  I spent about 45 minutes with she and her boyfriend. They're both very nice. I gave them each a small gift to help celebrate New Year's. They were both very appreciative of our office's wonderful staff and the compassion that was shown them.

## 2016-08-27 ENCOUNTER — Encounter (HOSPITAL_COMMUNITY): Payer: Self-pay

## 2016-09-06 ENCOUNTER — Encounter: Payer: Self-pay | Admitting: Family Medicine

## 2016-09-06 ENCOUNTER — Encounter: Payer: Self-pay | Admitting: Physician Assistant

## 2016-09-06 ENCOUNTER — Other Ambulatory Visit: Payer: Self-pay | Admitting: Family Medicine

## 2016-09-06 ENCOUNTER — Ambulatory Visit (INDEPENDENT_AMBULATORY_CARE_PROVIDER_SITE_OTHER): Payer: BLUE CROSS/BLUE SHIELD | Admitting: Physician Assistant

## 2016-09-06 VITALS — BP 140/82 | HR 88 | Temp 98.3°F | Resp 16 | Ht 62.0 in | Wt 251.0 lb

## 2016-09-06 DIAGNOSIS — B9689 Other specified bacterial agents as the cause of diseases classified elsewhere: Secondary | ICD-10-CM | POA: Diagnosis not present

## 2016-09-06 DIAGNOSIS — R0789 Other chest pain: Secondary | ICD-10-CM

## 2016-09-06 DIAGNOSIS — J019 Acute sinusitis, unspecified: Secondary | ICD-10-CM | POA: Diagnosis not present

## 2016-09-06 MED ORDER — BENZONATATE 100 MG PO CAPS: 100.0000 mg | ORAL_CAPSULE | Freq: Two times a day (BID) | ORAL | 0 refills | Status: DC | PRN

## 2016-09-06 MED ORDER — AMOXICILLIN-POT CLAVULANATE 875-125 MG PO TABS
1.0000 | ORAL_TABLET | Freq: Two times a day (BID) | ORAL | 0 refills | Status: DC
Start: 2016-09-06 — End: 2016-09-09

## 2016-09-06 MED ORDER — ALBUTEROL SULFATE HFA 108 (90 BASE) MCG/ACT IN AERS: 2.0000 | INHALATION_SPRAY | Freq: Four times a day (QID) | RESPIRATORY_TRACT | 0 refills | Status: DC | PRN

## 2016-09-06 NOTE — Patient Instructions (Signed)
Please take antibiotic as directed.  Increase fluid intake.  Use Saline nasal spray.  Take a daily multivitamin. Continue Mucinex-DM. You can use Tessalon as directed for nighttime cough.  Use albuterol as directed if needed for chest tightness or wheezing.  Place a humidifier in the bedroom.  Please call or return clinic if symptoms are not improving.  Sinusitis Sinusitis is redness, soreness, and swelling (inflammation) of the paranasal sinuses. Paranasal sinuses are air pockets within the bones of your face (beneath the eyes, the middle of the forehead, or above the eyes). In healthy paranasal sinuses, mucus is able to drain out, and air is able to circulate through them by way of your nose. However, when your paranasal sinuses are inflamed, mucus and air can become trapped. This can allow bacteria and other germs to grow and cause infection. Sinusitis can develop quickly and last only a short time (acute) or continue over a long period (chronic). Sinusitis that lasts for more than 12 weeks is considered chronic.  CAUSES  Causes of sinusitis include:  Allergies.  Structural abnormalities, such as displacement of the cartilage that separates your nostrils (deviated septum), which can decrease the air flow through your nose and sinuses and affect sinus drainage.  Functional abnormalities, such as when the small hairs (cilia) that line your sinuses and help remove mucus do not work properly or are not present. SYMPTOMS  Symptoms of acute and chronic sinusitis are the same. The primary symptoms are pain and pressure around the affected sinuses. Other symptoms include:  Upper toothache.  Earache.  Headache.  Bad breath.  Decreased sense of smell and taste.  A cough, which worsens when you are lying flat.  Fatigue.  Fever.  Thick drainage from your nose, which often is green and may contain pus (purulent).  Swelling and warmth over the affected sinuses. DIAGNOSIS  Your caregiver will  perform a physical exam. During the exam, your caregiver may:  Look in your nose for signs of abnormal growths in your nostrils (nasal polyps).  Tap over the affected sinus to check for signs of infection.  View the inside of your sinuses (endoscopy) with a special imaging device with a light attached (endoscope), which is inserted into your sinuses. If your caregiver suspects that you have chronic sinusitis, one or more of the following tests may be recommended:  Allergy tests.  Nasal culture A sample of mucus is taken from your nose and sent to a lab and screened for bacteria.  Nasal cytology A sample of mucus is taken from your nose and examined by your caregiver to determine if your sinusitis is related to an allergy. TREATMENT  Most cases of acute sinusitis are related to a viral infection and will resolve on their own within 10 days. Sometimes medicines are prescribed to help relieve symptoms (pain medicine, decongestants, nasal steroid sprays, or saline sprays).  However, for sinusitis related to a bacterial infection, your caregiver will prescribe antibiotic medicines. These are medicines that will help kill the bacteria causing the infection.  Rarely, sinusitis is caused by a fungal infection. In theses cases, your caregiver will prescribe antifungal medicine. For some cases of chronic sinusitis, surgery is needed. Generally, these are cases in which sinusitis recurs more than 3 times per year, despite other treatments. HOME CARE INSTRUCTIONS   Drink plenty of water. Water helps thin the mucus so your sinuses can drain more easily.  Use a humidifier.  Inhale steam 3 to 4 times a day (for example,  sit in the bathroom with the shower running).  Apply a warm, moist washcloth to your face 3 to 4 times a day, or as directed by your caregiver.  Use saline nasal sprays to help moisten and clean your sinuses.  Take over-the-counter or prescription medicines for pain, discomfort, or  fever only as directed by your caregiver. SEEK IMMEDIATE MEDICAL CARE IF:  You have increasing pain or severe headaches.  You have nausea, vomiting, or drowsiness.  You have swelling around your face.  You have vision problems.  You have a stiff neck.  You have difficulty breathing. MAKE SURE YOU:   Understand these instructions.  Will watch your condition.  Will get help right away if you are not doing well or get worse. Document Released: 07/29/2005 Document Revised: 10/21/2011 Document Reviewed: 08/13/2011 [facility] Patient Information 2014 [place], [place].

## 2016-09-06 NOTE — Progress Notes (Signed)
Patient presents to clinic today > 1 week of gradually worsening sinus congestion, nasal congestion, maxillary sinus pain, chest tightness with dry cough and wheezing. Denies fever, chills. Denies chest pain, shortness of breath. Denies history of asthma or COPD. Is a smoker -- 30 pack-year history. Has taken OTC Mucinex and advil for symptoms. Denies recent travel or sick contact.  Past Medical History:  Diagnosis Date  . Anxiety   . Arthritis    knees   . Carpal tunnel syndrome   . Family history of anesthesia complication    had an aunt cardiac arrest during a knee surgery  . GERD (gastroesophageal reflux disease)   . Hypertension   . Hypothyroidism   . Shortness of breath    with exertion   . Sleep apnea    uses cpap since 2/13  setting at 12     Current Outpatient Prescriptions on File Prior to Visit  Medication Sig Dispense Refill  . ALPRAZolam (XANAX) 0.5 MG tablet Take 1 tablet (0.5 mg total) by mouth 2 (two) times daily as needed for anxiety. 90 tablet 0  . cholecalciferol (VITAMIN D) 1000 units tablet Take 5,000 Units by mouth daily.    Marland Kitchen esomeprazole (NEXIUM) 40 MG capsule Take 40 mg by mouth daily as needed (acid reflux).     . hydrochlorothiazide (HYDRODIURIL) 12.5 MG tablet Take 1 tablet (12.5 mg total) by mouth daily. 90 tablet 3  . ibuprofen (ADVIL,MOTRIN) 200 MG tablet Take 400 mg by mouth every 6 (six) hours as needed for moderate pain.    Marland Kitchen losartan (COZAAR) 100 MG tablet Take 1 tablet (100 mg total) by mouth daily. 90 tablet 1  . Multiple Vitamin (MULTIVITAMIN) tablet Take 1 tablet by mouth daily.    . TURMERIC PO Take 1 tablet by mouth at bedtime.    Marland Kitchen loratadine (CLARITIN) 10 MG tablet Take 1 tablet (10 mg total) by mouth daily. (Patient not taking: Reported on 09/06/2016) 30 tablet 0   No current facility-administered medications on file prior to visit.     Allergies  Allergen Reactions  . Ace Inhibitors     Cough + feels like she has a lump in her  throat.     Family History  Problem Relation Age of Onset  . Arthritis Mother   . Alcohol abuse Mother   . Hyperlipidemia Father   . Heart disease Father   . Stroke Father   . Hypertension Father   . Cancer Son   . Arthritis Maternal Aunt   . Cancer Maternal Uncle   . Alcohol abuse Maternal Grandfather     Social History   Social History  . Marital status: Married    Spouse name: N/A  . Number of children: N/A  . Years of education: N/A   Social History Main Topics  . Smoking status: Current Every Day Smoker    Packs/day: 0.50    Years: 20.00  . Smokeless tobacco: Never Used  . Alcohol use Yes     Comment: rare  . Drug use: No  . Sexual activity: Not Asked   Other Topics Concern  . None   Social History Narrative  . None   Review of Systems - See HPI.  All other ROS are negative.  BP 140/82   Pulse 88   Temp 98.3 F (36.8 C) (Oral)   Resp 16   Ht 5\' 2"  (1.575 m)   Wt 251 lb (113.9 kg)   SpO2 98%  BMI 45.91 kg/m   Physical Exam  Constitutional: She is well-developed, well-nourished, and in no distress.  HENT:  Head: Normocephalic and atraumatic.  Right Ear: External ear normal.  Left Ear: External ear normal.  Nose: Mucosal edema and rhinorrhea present. Right sinus exhibits maxillary sinus tenderness. Left sinus exhibits maxillary sinus tenderness.  Mouth/Throat: Oropharynx is clear and moist.  Eyes: Conjunctivae are normal.  Neck: Neck supple.  Cardiovascular: Normal rate, regular rhythm, normal heart sounds and intact distal pulses.   Pulmonary/Chest: Effort normal. No respiratory distress. She has wheezes. She has no rales. She exhibits no tenderness.  Lymphadenopathy:    She has no cervical adenopathy.  Skin: Skin is warm and dry. No rash noted.  Psychiatric: Affect normal.  Vitals reviewed.   Recent Results (from the past 2160 hour(s))  Lipid panel     Status: Abnormal   Collection Time: 07/01/16  1:55 PM  Result Value Ref Range    Cholesterol 177 0 - 200 mg/dL    Comment: ATP III Classification       Desirable:  < 200 mg/dL               Borderline High:  200 - 239 mg/dL          High:  > = 240 mg/dL   Triglycerides 168.0 (H) 0.0 - 149.0 mg/dL    Comment: Normal:  <150 mg/dLBorderline High:  150 - 199 mg/dL   HDL 34.20 (L) >39.00 mg/dL   VLDL 33.6 0.0 - 40.0 mg/dL   LDL Cholesterol 109 (H) 0 - 99 mg/dL   Total CHOL/HDL Ratio 5     Comment:                Men          Women1/2 Average Risk     3.4          3.3Average Risk          5.0          4.42X Average Risk          9.6          7.13X Average Risk          15.0          11.0                       NonHDL 142.47     Comment: NOTE:  Non-HDL goal should be 30 mg/dL higher than patient's LDL goal (i.e. LDL goal of < 70 mg/dL, would have non-HDL goal of < 100 mg/dL)  Basic metabolic panel     Status: None   Collection Time: 07/01/16  1:55 PM  Result Value Ref Range   Sodium 139 135 - 145 mEq/L   Potassium 4.1 3.5 - 5.1 mEq/L   Chloride 104 96 - 112 mEq/L   CO2 28 19 - 32 mEq/L   Glucose, Bld 80 70 - 99 mg/dL   BUN 8 6 - 23 mg/dL   Creatinine, Ser 0.56 0.40 - 1.20 mg/dL   Calcium 9.6 8.4 - 10.5 mg/dL   GFR 124.32 >60.00 mL/min  TSH     Status: None   Collection Time: 07/01/16  1:55 PM  Result Value Ref Range   TSH 2.88 0.35 - 4.50 uIU/mL  Hepatic function panel     Status: None   Collection Time: 07/01/16  1:55 PM  Result Value Ref Range   Total Bilirubin 0.4  0.2 - 1.2 mg/dL   Bilirubin, Direct 0.1 0.0 - 0.3 mg/dL   Alkaline Phosphatase 66 39 - 117 U/L   AST 13 0 - 37 U/L   ALT 14 0 - 35 U/L   Total Protein 6.7 6.0 - 8.3 g/dL   Albumin 4.1 3.5 - 5.2 g/dL  Hemoglobin A1c     Status: None   Collection Time: 07/01/16  1:55 PM  Result Value Ref Range   Hgb A1c MFr Bld 5.1 4.6 - 6.5 %    Comment: Glycemic Control Guidelines for People with Diabetes:Non Diabetic:  <6%Goal of Therapy: <7%Additional Action Suggested:  >8%   CBC with Differential/Platelet      Status: Abnormal   Collection Time: 07/01/16  1:55 PM  Result Value Ref Range   WBC 14.8 (H) 4.0 - 10.5 K/uL   RBC 5.07 3.87 - 5.11 Mil/uL   Hemoglobin 15.4 (H) 12.0 - 15.0 g/dL   HCT 45.4 36.0 - 46.0 %   MCV 89.6 78.0 - 100.0 fl   MCHC 34.0 30.0 - 36.0 g/dL   RDW 12.8 11.5 - 15.5 %   Platelets 264.0 150.0 - 400.0 K/uL   Neutrophils Relative % 67.6 43.0 - 77.0 %   Lymphocytes Relative 24.9 12.0 - 46.0 %   Monocytes Relative 6.0 3.0 - 12.0 %   Eosinophils Relative 1.1 0.0 - 5.0 %   Basophils Relative 0.4 0.0 - 3.0 %   Neutro Abs 10.0 (H) 1.4 - 7.7 K/uL   Lymphs Abs 3.7 0.7 - 4.0 K/uL   Monocytes Absolute 0.9 0.1 - 1.0 K/uL   Eosinophils Absolute 0.2 0.0 - 0.7 K/uL   Basophils Absolute 0.1 0.0 - 0.1 K/uL  CBC with Differential [facility])     Status: Abnormal   Collection Time: 08/09/16  1:31 PM  Result Value Ref Range   WBC 10.9 (H) 3.9 - 10.0 10e3/uL   RBC 4.91 3.70 - 5.32 10e6/uL   HGB 15.1 11.6 - 15.9 g/dL   HCT 43.8 34.8 - 46.6 %   MCV 89 81 - 101 fL   MCH 30.8 26.0 - 34.0 pg   MCHC 34.5 32.0 - 36.0 g/dL   RDW 12.7 11.1 - 15.7 %   Platelets 241 145 - 400 10e3/uL   NEUT# 6.6 (H) 1.5 - 6.5 10e3/uL   LYMPH# 3.4 (H) 0.9 - 3.3 10e3/uL   MONO# 0.7 0.1 - 0.9 10e3/uL   Eosinophils Absolute 0.2 0.0 - 0.5 10e3/uL   BASO# 0.0 0.0 - 0.2 10e3/uL   NEUT% 60.8 39.6 - 80.0 %   LYMPH% 30.8 14.0 - 48.0 %   MONO% 6.5 0.0 - 13.0 %   EOS% 1.6 0.0 - 7.0 %   BASO% 0.3 0.0 - 2.0 %  [facility] - Smear     Status: None   Collection Time: 08/09/16  1:31 PM  Result Value Ref Range   Smear Result Smear Available   TECHNOLOGIST REVIEW [facility]     Status: None   Collection Time: 08/09/16  1:31 PM  Result Value Ref Range   Tech Review 1% myelocyte; 3% atypical lymphs     Assessment/Plan: 1. Acute bacterial sinusitis Rx Augmentin.  Increase fluids.  Rest.  Saline nasal spray.  Probiotic.  Mucinex as directed.  Humidifier in bedroom. Tessalon per orders.  Call or return to  clinic if symptoms are not improving.  - amoxicillin-clavulanate (AUGMENTIN) 875-125 MG tablet; Take 1 tablet by mouth 2 (two) times daily.  Dispense:  14 tablet; Refill: 0 - benzonatate (TESSALON) 100 MG capsule; Take 1 capsule (100 mg total) by mouth 2 (two) times daily as needed for cough.  Dispense: 20 capsule; Refill: 0  2. Chest tightness Rx Albuterol to use as directed. Supportive measures reviewed. - albuterol (PROVENTIL HFA;VENTOLIN HFA) 108 (90 Base) MCG/ACT inhaler; Inhale 2 puffs into the lungs every 6 (six) hours as needed for wheezing or shortness of breath.  Dispense: 1 Inhaler; Refill: 0   Leeanne Rio, [place]

## 2016-09-06 NOTE — Progress Notes (Signed)
Pre visit review using our clinic review tool, if applicable. No additional management support is needed unless otherwise documented below in the visit note. 

## 2016-09-09 ENCOUNTER — Encounter: Payer: Self-pay | Admitting: Physician Assistant

## 2016-09-09 MED ORDER — DOXYCYCLINE HYCLATE 100 MG PO CAPS: 100.0000 mg | ORAL_CAPSULE | Freq: Two times a day (BID) | ORAL | 0 refills | Status: DC

## 2016-09-18 ENCOUNTER — Encounter: Payer: Self-pay | Admitting: Family Medicine

## 2016-09-18 MED ORDER — FLUCONAZOLE 150 MG PO TABS: 150.0000 mg | ORAL_TABLET | Freq: Once | ORAL | 0 refills | Status: AC

## 2016-09-18 MED ORDER — ALPRAZOLAM 0.5 MG PO TABS: 0.5000 mg | ORAL_TABLET | Freq: Two times a day (BID) | ORAL | 0 refills | Status: DC | PRN

## 2016-10-14 ENCOUNTER — Other Ambulatory Visit: Payer: Self-pay | Admitting: Family Medicine

## 2016-10-14 DIAGNOSIS — M1990 Unspecified osteoarthritis, unspecified site: Secondary | ICD-10-CM | POA: Diagnosis not present

## 2016-10-14 DIAGNOSIS — J302 Other seasonal allergic rhinitis: Secondary | ICD-10-CM | POA: Diagnosis not present

## 2016-10-14 DIAGNOSIS — I10 Essential (primary) hypertension: Secondary | ICD-10-CM | POA: Diagnosis not present

## 2016-10-14 DIAGNOSIS — K219 Gastro-esophageal reflux disease without esophagitis: Secondary | ICD-10-CM | POA: Diagnosis not present

## 2016-10-14 NOTE — Telephone Encounter (Signed)
Last OV 07/01/16 Alprazolam last filled 09/18/16 #90 with 0

## 2016-10-14 NOTE — Telephone Encounter (Signed)
Medication filled to pharmacy as requested.   

## 2016-12-03 ENCOUNTER — Encounter: Payer: Self-pay | Admitting: Podiatry

## 2016-12-03 ENCOUNTER — Ambulatory Visit (INDEPENDENT_AMBULATORY_CARE_PROVIDER_SITE_OTHER): Payer: BLUE CROSS/BLUE SHIELD | Admitting: Podiatry

## 2016-12-03 ENCOUNTER — Ambulatory Visit (HOSPITAL_BASED_OUTPATIENT_CLINIC_OR_DEPARTMENT_OTHER)
Admission: RE | Admit: 2016-12-03 | Discharge: 2016-12-03 | Disposition: A | Discharge status: Home / Self Care | Payer: BLUE CROSS/BLUE SHIELD | Source: Ambulatory Visit | Attending: Podiatry | Admitting: Podiatry

## 2016-12-03 DIAGNOSIS — M6740 Ganglion, unspecified site: Secondary | ICD-10-CM

## 2016-12-03 DIAGNOSIS — M779 Enthesopathy, unspecified: Secondary | ICD-10-CM

## 2016-12-03 DIAGNOSIS — R52 Pain, unspecified: Secondary | ICD-10-CM

## 2016-12-03 DIAGNOSIS — M25871 Other specified joint disorders, right ankle and foot: Secondary | ICD-10-CM

## 2016-12-03 DIAGNOSIS — M25571 Pain in right ankle and joints of right foot: Secondary | ICD-10-CM | POA: Diagnosis not present

## 2016-12-03 MED ORDER — MELOXICAM 15 MG PO TABS: 15.0000 mg | ORAL_TABLET | Freq: Every day | ORAL | 0 refills | Status: DC

## 2016-12-03 NOTE — Progress Notes (Signed)
Subjective:    Patient ID: Jenna Pope, female    DOB: 01-21-1971, 46 y.o.   MRN: [id number]  HPI  46 year old female presents the office today for concerns her right ankle pain which is been ongoing for about 1-2 months. She states the pain is intermittent in nature she has difficulty at times when moving her ankle. She states that she is able to go to the gym and she is able to walk on the treadmill but when she comes off the treadmill she has some pain with trying to bend her foot. She describes as a throbbing sensation. She denies any recent injury or trauma she has not had any significant increase in swelling although she has noted some chronic swelling to both of her ankles point to the lateral ankle. In regards to the swelling this is been on both ankles of the right side worse and left. She said no recent treatment. She has no other complaints.  Review of Systems  All other systems reviewed and are negative.      Objective:   Physical Exam General: AAO x3, NAD  Dermatological: Skin is warm, dry and supple bilateral. Nails x 10 are well manicured; remaining integument appears unremarkable at this time. There are no open sores, no preulcerative lesions, no rash or signs of infection present.  Vascular: Dorsalis Pedis artery and Posterior Tibial artery pedal pulses are 2/4 bilateral with immedate capillary fill time. Pedal hair growth present.  There is no pain with calf compression, swelling, warmth, erythema.   Neruologic: Grossly intact via light touch bilateral. Vibratory intact via tuning fork bilateral. Protective threshold with Semmes Wienstein monofilament intact to all pedal sites bilateral.  Musculoskeletal: There is mild tenderness on the anterior ankle joint line and there is also more tenderness noted along the course of the sinus tarsi on the subtalar joint. There is also fluid filled soft tissue mass present within the lateral foot just distal to the fibula on the sinus  tarsi and this may represent a small ganglion cyst. This is also present on the left side was more symptomatic on the right side. There is no erythema or increase in warmth. The decrease in medial arch height. Muscular strength 5/5 in all groups tested bilateral.  Gait: Unassisted, Nonantalgic.      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 I additionally reviewed the x-rays with the patient. Discussed anterior impingement also mild arthritic changes of the ankle. -Monitor her pain to the before meals 3 from the sinus tarsi as well as the soft tissue mass. After discussion we decided to try to aspirate the fluid from the lateral ankle. I anesthetized the area with 3 mL of lidocaine/Marcaine plain. Once anesthetized 18-gauge heels infiltrated into the area and aspirated however I was unable to remove any fluid from this area. I did perform a steroid injection with a small amount of Kenalog and local anesthetic into the sinus tarsi of the Swisher the majority of tenderness. I do not infiltrated into the ankle joint today. I elected to do the sinus tarsi/subtalar joint today for both diagnostic and therapeutic purposes and she understands this. We will hold off on the ankle brace because she can do her daily activities or any problems. Discussed also symptoms continue possible MRI. However I'll  see her back in 2 weeks and see [place] she is doing.  Celesta Gentile, DPM

## 2016-12-13 ENCOUNTER — Other Ambulatory Visit (HOSPITAL_BASED_OUTPATIENT_CLINIC_OR_DEPARTMENT_OTHER): Payer: BLUE CROSS/BLUE SHIELD

## 2016-12-13 ENCOUNTER — Ambulatory Visit (HOSPITAL_BASED_OUTPATIENT_CLINIC_OR_DEPARTMENT_OTHER): Payer: BLUE CROSS/BLUE SHIELD | Admitting: Hematology & Oncology

## 2016-12-13 VITALS — BP 135/81 | HR 98 | Temp 98.3°F | Resp 18 | Wt 264.0 lb

## 2016-12-13 DIAGNOSIS — D72823 Leukemoid reaction: Secondary | ICD-10-CM | POA: Diagnosis not present

## 2016-12-13 DIAGNOSIS — Z720 Tobacco use: Secondary | ICD-10-CM | POA: Diagnosis not present

## 2016-12-13 LAB — CBC WITH DIFFERENTIAL (CANCER CENTER ONLY)
BASO#: 0.1 10*3/uL (ref 0.0–0.2)
BASO%: 0.5 % (ref 0.0–2.0)
EOS%: 0.9 % (ref 0.0–7.0)
Eosinophils Absolute: 0.1 10*3/uL (ref 0.0–0.5)
HCT: 45.4 % (ref 34.8–46.6)
HGB: 15.7 g/dL (ref 11.6–15.9)
LYMPH#: 3.9 10*3/uL — ABNORMAL HIGH (ref 0.9–3.3)
LYMPH%: 25.7 % (ref 14.0–48.0)
MCH: 31.3 pg (ref 26.0–34.0)
MCHC: 34.6 g/dL (ref 32.0–36.0)
MCV: 91 fL (ref 81–101)
MONO#: 1.1 10*3/uL — ABNORMAL HIGH (ref 0.1–0.9)
MONO%: 7.4 % (ref 0.0–13.0)
NEUT#: 9.8 10*3/uL — ABNORMAL HIGH (ref 1.5–6.5)
NEUT%: 65.5 % (ref 39.6–80.0)
Platelets: 268 10*3/uL (ref 145–400)
RBC: 5.01 10*6/uL (ref 3.70–5.32)
RDW: 13 % (ref 11.1–15.7)
WBC: 15 10*3/uL — ABNORMAL HIGH (ref 3.9–10.0)

## 2016-12-13 LAB — CHCC SATELLITE - SMEAR

## 2016-12-13 NOTE — Progress Notes (Signed)
Hematology and Oncology Follow Up Visit  Jenna Pope [id number] 10-27-1970 46 y.o. 12/13/2016   Principle Diagnosis:   Leukocytosis-leukemoid reaction  Current Therapy:    Observation     Interim History:  Jenna Pope is back for follow-up. This is her second office visit. We first saw her back in December. At that time, her white cell count was 10.9. Her workup has been pretty much unremarkable.  She does smoke. She's trying to cut back.  She's had no infections. She recently had a sinus infection. She was on some antibiotics.  She's had no nausea or vomiting. She's had no rashes. She's had no leg swelling.  She does have a ganglion cyst with the lateral malleolus of the right foot.  She's had no fever. She's not had a mammogram for 4 years.. She's not had a Pap smear for 3 years.. She says that she will have this done.  Overall, her performance status is ECOG 1.  Medications:  Current Outpatient Prescriptions:  .  albuterol (PROVENTIL HFA;VENTOLIN HFA) 108 (90 Base) MCG/ACT inhaler, Inhale 2 puffs into the lungs every 6 (six) hours as needed for wheezing or shortness of breath., Disp: 1 Inhaler, Rfl: 0 .  ALPRAZolam (XANAX) 0.5 MG tablet, TAKE 1 TABLET BY MOUTH TWICE A DAY AS NEEDED FOR ANXIETY, Disp: 90 tablet, Rfl: 0 .  benzonatate (TESSALON) 100 MG capsule, Take 1 capsule (100 mg total) by mouth 2 (two) times daily as needed for cough., Disp: 20 capsule, Rfl: 0 .  cholecalciferol (VITAMIN D) 1000 units tablet, Take 5,000 Units by mouth daily., Disp: , Rfl:  .  doxycycline (VIBRAMYCIN) 100 MG capsule, Take 1 capsule (100 mg total) by mouth 2 (two) times daily., Disp: 20 capsule, Rfl: 0 .  esomeprazole (NEXIUM) 40 MG capsule, Take 40 mg by mouth daily as needed (acid reflux). , Disp: , Rfl:  .  hydrochlorothiazide (HYDRODIURIL) 12.5 MG tablet, Take 1 tablet (12.5 mg total) by mouth daily., Disp: 90 tablet, Rfl: 3 .  ibuprofen (ADVIL,MOTRIN) 200 MG tablet, Take 400 mg by mouth  every 6 (six) hours as needed for moderate pain., Disp: , Rfl:  .  loratadine (CLARITIN) 10 MG tablet, Take 1 tablet (10 mg total) by mouth daily. (Patient not taking: Reported on 09/06/2016), Disp: 30 tablet, Rfl: 0 .  losartan (COZAAR) 100 MG tablet, Take 1 tablet (100 mg total) by mouth daily., Disp: 90 tablet, Rfl: 1 .  meloxicam (MOBIC) 15 MG tablet, Take 1 tablet (15 mg total) by mouth daily., Disp: 30 tablet, Rfl: 0 .  Multiple Vitamin (MULTIVITAMIN) tablet, Take 1 tablet by mouth daily., Disp: , Rfl:  .  TURMERIC PO, Take 1 tablet by mouth at bedtime., Disp: , Rfl:   Allergies:  Allergies  Allergen Reactions  . Ace Inhibitors     Cough + feels like she has a lump in her throat.     Past Medical History, Surgical history, Social history, and Family History were reviewed and updated.  Review of Systems:  As above  Physical Exam:  weight is 264 lb (119.7 kg). Her oral temperature is 98.3 F (36.8 C). Her blood pressure is 135/81 and her pulse is 98. Her respiration is 18 and oxygen saturation is 100%.   Wt Readings from Last 3 Encounters:  12/13/16 264 lb (119.7 kg)  09/06/16 251 lb (113.9 kg)  08/09/16 255 lb 12.8 oz (116 kg)      Head and neck exam shows no ocular or oral lesions.  She has no scleral icterus. She has no adenopathy in the neck. Thyroid is nonpalpable. Lungs are clear bilaterally. She has no rales or wheezes. Cardiac exam regular rate and rhythm with no murmurs, rubs or bruits. Abdomen is soft. She has good bowel sounds. There is no fluid wave. There is no palpable liver or spleen tip. Back exam shows no tenderness over the spine, ribs or hips. Extremities shows no clubbing, cyanosis or edema. Neurological exam shows no focal neurological deficit. Skin exam shows numerous hyperpigmented lesions. She has some keratoses. She has some nevi. I do not see anything that is unusual.  Lab Results  Component Value Date   WBC 15.0 (H) 12/13/2016   HGB 15.7 12/13/2016    HCT 45.4 12/13/2016   MCV 91 12/13/2016   PLT 268 12/13/2016     Chemistry      Component Value Date/Time   NA 139 07/01/2016 1355   K 4.1 07/01/2016 1355   CL 104 07/01/2016 1355   CO2 28 07/01/2016 1355   BUN 8 07/01/2016 1355   CREATININE 0.56 07/01/2016 1355   CREATININE 0.77 04/08/2014 1053      Component Value Date/Time   CALCIUM 9.6 07/01/2016 1355   ALKPHOS 66 07/01/2016 1355   AST 13 07/01/2016 1355   ALT 14 07/01/2016 1355   BILITOT 0.4 07/01/2016 1355         Impression and Plan: Jenna Pope is a 46 year old white female. Her white cell count is up a little bit more. However, she has a normal white cell differential. This, to me, is reassuring.  I will get her back in 4 months. I think this would be reasonable. Again I have to believe that this is a leukemoid reaction. I don't see anything under the microscope that looks malignant. I don't see think that looks like a myeloproliferative process.  It is a lot of fun to talk to she and her husband. They're both very nice.  I told her that I just do not think that we had anything to worry about. I told her to stop smoking. This may help her transient leukocytosis.   Volanda Napoleon, MD 5/4/20184:53 PM

## 2016-12-24 ENCOUNTER — Ambulatory Visit (INDEPENDENT_AMBULATORY_CARE_PROVIDER_SITE_OTHER): Payer: BLUE CROSS/BLUE SHIELD | Admitting: Podiatry

## 2016-12-24 ENCOUNTER — Encounter: Payer: Self-pay | Admitting: Podiatry

## 2016-12-24 DIAGNOSIS — M779 Enthesopathy, unspecified: Secondary | ICD-10-CM | POA: Diagnosis not present

## 2016-12-24 DIAGNOSIS — T148XXA Other injury of unspecified body region, initial encounter: Secondary | ICD-10-CM

## 2016-12-24 DIAGNOSIS — M25871 Other specified joint disorders, right ankle and foot: Secondary | ICD-10-CM

## 2016-12-24 DIAGNOSIS — M6740 Ganglion, unspecified site: Secondary | ICD-10-CM

## 2016-12-24 NOTE — Progress Notes (Signed)
Subjective: [name] presents to the office today for follow-up evaluation of right ankle pain, capsulitis, possible soft tissue mass (ganglion cyst). She states that it is still somewhat painful but not as bad as what it was. She still gets some swelling to the also to the ankle and still some discomfort the office after the ankle. She states her ankle does pop at times as well of her she's done activity. Denies any systemic complaints such as fevers, chills, nausea, vomiting. No acute changes since last appointment, and no other complaints at this time.   Objective: AAO x3, NAD DP/PT pulses palpable bilaterally, CRT less than 3 seconds This continuation of moderate edema to the effected ankle and is mobile soft tissue mass consistent with ganglion cyst is palpable in the area but this could also represent swelling. There is tenderness along the course the ATFL ankle ligament. There is no gross ankle instability present. We will discomfort in the anterior ankle joint line. There is no other areas of pinpoint tenderness there is no pain vibratory sensation. There is no erythema or increase in warmth. No open lesions or pre-ulcerative lesions.  No pain with calf compression, swelling, warmth, erythema  Assessment: Right ankle soft tissue mass, swelling; without lateral ankle ligament tear; anterior impingement ankle  Plan: -All treatment options discussed with the patient including all alternatives, risks, complications.  -Post her symptoms are somewhat improved but they do continue. At this point I recommended an MRI to further evaluate the integrity the ankle joint as well as for soft tissue mass as well as lateral ankle ligament disruption. This for possible surgical planning. -Continue ice the area ankle brace if needed. -Follow-up after MRI or sooner if needed. -Patient encouraged to call the office with any questions, concerns, change in symptoms.   Celesta Gentile, DPM

## 2016-12-25 ENCOUNTER — Telehealth: Payer: Self-pay | Admitting: *Deleted

## 2016-12-25 DIAGNOSIS — T148XXA Other injury of unspecified body region, initial encounter: Secondary | ICD-10-CM

## 2016-12-25 DIAGNOSIS — M25871 Other specified joint disorders, right ankle and foot: Secondary | ICD-10-CM

## 2016-12-25 DIAGNOSIS — M6740 Ganglion, unspecified site: Secondary | ICD-10-CM

## 2016-12-25 DIAGNOSIS — M779 Enthesopathy, unspecified: Secondary | ICD-10-CM

## 2016-12-25 NOTE — Telephone Encounter (Addendum)
-----   Message from Trula Slade, DPM sent at 12/24/2016  8:18 AM EDT ----- Can you please order an MRI of the right ankle to look for anterior ankle impingement/ankle osteoarthritis; lateral ankle ligament tear and soft tissue mass lateral ankle.   Can this be done at [facility] [place]? If not she is OK coming to [place]. 12/25/2016-Orders given to D. [facility] for [employer].

## 2016-12-26 ENCOUNTER — Encounter: Payer: Self-pay | Admitting: Family Medicine

## 2016-12-26 ENCOUNTER — Ambulatory Visit (INDEPENDENT_AMBULATORY_CARE_PROVIDER_SITE_OTHER): Payer: BLUE CROSS/BLUE SHIELD | Admitting: Family Medicine

## 2016-12-26 ENCOUNTER — Other Ambulatory Visit: Payer: Self-pay | Admitting: General Practice

## 2016-12-26 VITALS — BP 131/80 | HR 82 | Temp 98.0°F | Resp 17 | Ht 62.0 in | Wt 286.4 lb

## 2016-12-26 DIAGNOSIS — Z0000 Encounter for general adult medical examination without abnormal findings: Secondary | ICD-10-CM

## 2016-12-26 DIAGNOSIS — E559 Vitamin D deficiency, unspecified: Secondary | ICD-10-CM

## 2016-12-26 DIAGNOSIS — E039 Hypothyroidism, unspecified: Secondary | ICD-10-CM | POA: Diagnosis not present

## 2016-12-26 LAB — HEPATIC FUNCTION PANEL
ALBUMIN: 4 g/dL (ref 3.5–5.2)
ALT: 19 U/L (ref 0–35)
AST: 18 U/L (ref 0–37)
Alkaline Phosphatase: 69 U/L (ref 39–117)
Bilirubin, Direct: 0.1 mg/dL (ref 0.0–0.3)
Total Bilirubin: 0.4 mg/dL (ref 0.2–1.2)
Total Protein: 6.5 g/dL (ref 6.0–8.3)

## 2016-12-26 LAB — CBC WITH DIFFERENTIAL/PLATELET
BASOS PCT: 0.8 % (ref 0.0–3.0)
Basophils Absolute: 0.1 10*3/uL (ref 0.0–0.1)
Eosinophils Absolute: 0.2 10*3/uL (ref 0.0–0.7)
Eosinophils Relative: 1.6 % (ref 0.0–5.0)
HCT: 43.8 % (ref 36.0–46.0)
HEMOGLOBIN: 14.9 g/dL (ref 12.0–15.0)
Lymphocytes Relative: 22.5 % (ref 12.0–46.0)
Lymphs Abs: 2.4 10*3/uL (ref 0.7–4.0)
MCHC: 33.9 g/dL (ref 30.0–36.0)
MCV: 92.2 fl (ref 78.0–100.0)
MONO ABS: 0.6 10*3/uL (ref 0.1–1.0)
MONOS PCT: 5.6 % (ref 3.0–12.0)
Neutro Abs: 7.5 10*3/uL (ref 1.4–7.7)
Neutrophils Relative %: 69.5 % (ref 43.0–77.0)
Platelets: 250 10*3/uL (ref 150.0–400.0)
RBC: 4.75 Mil/uL (ref 3.87–5.11)
RDW: 13.5 % (ref 11.5–15.5)
WBC: 10.8 10*3/uL — AB (ref 4.0–10.5)

## 2016-12-26 LAB — BASIC METABOLIC PANEL
BUN: 8 mg/dL (ref 6–23)
CALCIUM: 9.3 mg/dL (ref 8.4–10.5)
CO2: 26 mEq/L (ref 19–32)
CREATININE: 0.67 mg/dL (ref 0.40–1.20)
Chloride: 106 mEq/L (ref 96–112)
GFR: 100.86 mL/min (ref >60.00)
Glucose, Bld: 101 mg/dL — ABNORMAL HIGH (ref 70–99)
Potassium: 4.4 mEq/L (ref 3.5–5.1)
SODIUM: 138 meq/L (ref 135–145)

## 2016-12-26 LAB — LIPID PANEL
CHOLESTEROL: 182 mg/dL (ref 0–200)
HDL: 35.5 mg/dL — ABNORMAL LOW (ref >39.00)
LDL CALC: 117 mg/dL — AB (ref 0–99)
NonHDL: 146.98
TRIGLYCERIDES: 150 mg/dL — AB (ref 0.0–149.0)
Total CHOL/HDL Ratio: 5
VLDL: 30 mg/dL (ref 0.0–40.0)

## 2016-12-26 LAB — VITAMIN D 25 HYDROXY (VIT D DEFICIENCY, FRACTURES): VITD: 18.96 ng/mL — ABNORMAL LOW (ref 30.00–100.00)

## 2016-12-26 LAB — TSH: TSH: 3.27 u[IU]/mL (ref 0.35–4.50)

## 2016-12-26 MED ORDER — ALPRAZOLAM 0.5 MG PO TABS: ORAL_TABLET | ORAL | 0 refills | Status: DC

## 2016-12-26 NOTE — Assessment & Plan Note (Signed)
Ongoing issue.  Pt has gained 4 lbs since last visit.  BMI is approaching 50.  Stressed need for healthy diet and regular exercise.  Check labs to risk stratify.  Will follow.

## 2016-12-26 NOTE — Progress Notes (Signed)
   Subjective:    Patient ID: Jenna Pope, female    DOB: [birth year], 46 y.o.   MRN: [id number]  HPI CPE- UTD on Tdap, due for mammo w/ Dr Nori Riis- pt plans to schedule.  Pt reports she recently started exercising at the gym but has R ankle injury (seeing Dr Jacqualyn Posey, Podiatry)   Review of Systems Patient reports no vision/ hearing changes, adenopathy,fever, weight change,  persistant/recurrent hoarseness , swallowing issues, chest pain, palpitations, edema, persistant/recurrent cough, hemoptysis, dyspnea (rest/exertional/paroxysmal nocturnal), gastrointestinal bleeding (melena, rectal bleeding), abdominal pain, significant heartburn, bowel changes, GU symptoms (dysuria, hematuria, incontinence), Gyn symptoms (abnormal  bleeding, pain),  syncope, focal weakness, memory loss, numbness & tingling, skin/hair/nail changes, abnormal bruising or bleeding, anxiety, or depression.     Objective:   Physical Exam General Appearance:    Alert, cooperative, no distress, appears stated age  Head:    Normocephalic, without obvious abnormality, atraumatic  Eyes:    PERRL, conjunctiva/corneas clear, EOM's intact, fundi    benign, both eyes  Ears:    Normal TM's and external ear canals, both ears  Nose:   Nares normal, septum midline, mucosa normal, no drainage    or sinus tenderness  Throat:   Lips, mucosa, and tongue normal; teeth and gums normal  Neck:   Supple, symmetrical, trachea midline, no adenopathy;    Thyroid: no enlargement/tenderness/nodules  Back:     Symmetric, no curvature, ROM normal, no CVA tenderness  Lungs:     Clear to auscultation bilaterally, respirations unlabored  Chest Wall:    No tenderness or deformity   Heart:    Regular rate and rhythm, S1 and S2 normal, no murmur, rub   or gallop  Breast Exam:    Deferred to GYN  Abdomen:     Soft, non-tender, bowel sounds active all four quadrants,    no masses, no organomegaly  Genitalia:    Deferred to GYN  Rectal:    Extremities:    Extremities normal, atraumatic, no cyanosis or edema  Pulses:   2+ and symmetric all extremities  Skin:   Skin color, texture, turgor normal, no rashes or lesions  Lymph nodes:   Cervical, supraclavicular, and axillary nodes normal  Neurologic:   CNII-XII intact, normal strength, sensation and reflexes    throughout          Assessment & Plan:

## 2016-12-26 NOTE — Assessment & Plan Note (Signed)
Pt's PE WNL w/ exception of morbid obesity.  Overdue for GYN- pt plans to schedule.  Stressed need for yearly mammo.  Discussed need for healthy diet/regular exercise and smoking cessation.  Check labs.  Anticipatory guidance provided.

## 2016-12-26 NOTE — Assessment & Plan Note (Signed)
Check labs.  Replete prn. 

## 2016-12-26 NOTE — Patient Instructions (Signed)
Follow up in 6 months to recheck BP We'll notify you of your lab results and make any changes if needed Continue to work on healthy diet and regular exercise- you can do it! Quit smoking!!!  You can do it!!! Find an outlet for your stress to avoid stress eating- art, music, journalling, etc Call and schedule w/ Dr Nori Riis Call with any questions or concerns Have a great summer!!!

## 2016-12-26 NOTE — Progress Notes (Signed)
Pre visit review using our clinic review tool, if applicable. No additional management support is needed unless otherwise documented below in the visit note. 

## 2016-12-27 ENCOUNTER — Other Ambulatory Visit: Payer: Self-pay | Admitting: General Practice

## 2016-12-27 MED ORDER — VITAMIN D (ERGOCALCIFEROL) 1.25 MG (50000 UNIT) PO CAPS: 50000.0000 [IU] | ORAL_CAPSULE | ORAL | 0 refills | Status: DC

## 2017-01-01 DIAGNOSIS — J069 Acute upper respiratory infection, unspecified: Secondary | ICD-10-CM | POA: Diagnosis not present

## 2017-01-04 ENCOUNTER — Ambulatory Visit (HOSPITAL_BASED_OUTPATIENT_CLINIC_OR_DEPARTMENT_OTHER)
Admission: RE | Admit: 2017-01-04 | Discharge: 2017-01-04 | Disposition: A | Discharge status: Home / Self Care | Payer: BLUE CROSS/BLUE SHIELD | Source: Ambulatory Visit | Attending: Podiatry | Admitting: Podiatry

## 2017-01-04 DIAGNOSIS — S93491A Sprain of other ligament of right ankle, initial encounter: Secondary | ICD-10-CM | POA: Diagnosis not present

## 2017-01-04 DIAGNOSIS — X58XXXA Exposure to other specified factors, initial encounter: Secondary | ICD-10-CM | POA: Diagnosis not present

## 2017-01-14 ENCOUNTER — Ambulatory Visit: Payer: BLUE CROSS/BLUE SHIELD | Admitting: Podiatry

## 2017-01-21 ENCOUNTER — Encounter: Payer: Self-pay | Admitting: Podiatry

## 2017-01-21 ENCOUNTER — Ambulatory Visit (INDEPENDENT_AMBULATORY_CARE_PROVIDER_SITE_OTHER): Payer: BLUE CROSS/BLUE SHIELD | Admitting: Podiatry

## 2017-01-21 DIAGNOSIS — T148XXA Other injury of unspecified body region, initial encounter: Secondary | ICD-10-CM

## 2017-01-21 DIAGNOSIS — M6740 Ganglion, unspecified site: Secondary | ICD-10-CM | POA: Diagnosis not present

## 2017-01-21 NOTE — Progress Notes (Signed)
Subjective: 46 year old female presents the office they for follow-up evaluation of right ankle pain and discussed MRI results. She states that her symptoms are somewhat better but she continues to get pain to point out that after the ankle. She continues to have swelling to the outside aspect. She denies any recent injury or trauma. She does relate a remote history of multiple ankle sprains when she was younger. She denies any recent ankle sprain or any recent injury. She states that she notices a difference when she gets on and off the treadmill but she does not have any pain when walking on the treadmill. She has decreased her activity level since she started seeing me. Denies any systemic complaints such as fevers, chills, nausea, vomiting. No acute changes since last appointment, and no other complaints at this time.   Objective: AAO x3, NAD DP/PT pulses palpable bilaterally, CRT less than 3 seconds There is continued edema to the lateral aspect of the right ankle soft tissue masses palpable. There is tenderness palpation lateral aspect of ankle. There is mild discomfort on the ATFL. There is no erythema or increase in warmth. His difficult to evaluate see if the pain is coming from the ganglion cyst or along the lateral ankle ligaments. There is no gross ankle instability present. There is no pain with ankle joint range of motion or any crepitation. No pain with subtalar joint range of motion. No open lesions or pre-ulcerative lesions.  No pain with calf compression, swelling, warmth, erythema  MRI 01/04/2017 IMPRESSION: 1. 1.5 x 1.4 x 3.6 cm cystic mass along the anterolateral aspect of the ankle joint which appears to communicate with the joint space may reflect loculated effusion versus prominent enlarged joint capsule versus a ganglion cyst. 2. Complete anterior talofibular ligament tear.  Assessment: Ganglion cyst right ankle with complete ATFL tear.  Plan: -All treatment options  discussed with the patient including all alternatives, risks, complications.  -Today we discussed both conservative and surgical treatment options and her husband did accompany her today. We discussed surgical intervention including removal of the diagnosis as well as repair the ligament. However she was to try another steroid injection today. Under sterile conditions an injection of Kenalog local and cycles infiltrated into the soft tissue mass and along the lateral aspect of the ankle. Postinjection care was discussed. -Tri-Lock ankle brace. -If symptoms persist discussed surgical intervention the future. Discussed the surgery as well as the postoperative course. -Follow-up in 4 weeks or sooner if needed. Encouraged to call with any change in symptoms or questions.   Celesta Gentile, DPM -Patient encouraged to call the office with any questions, concerns, change in symptoms.

## 2017-01-22 ENCOUNTER — Encounter: Payer: Self-pay | Admitting: Family Medicine

## 2017-01-22 MED ORDER — PHENTERMINE HCL 8 MG PO TABS: 1.0000 | ORAL_TABLET | Freq: Three times a day (TID) | ORAL | 0 refills | Status: DC

## 2017-02-07 ENCOUNTER — Encounter: Payer: Self-pay | Admitting: Family Medicine

## 2017-02-18 ENCOUNTER — Ambulatory Visit (INDEPENDENT_AMBULATORY_CARE_PROVIDER_SITE_OTHER): Payer: BLUE CROSS/BLUE SHIELD | Admitting: Podiatry

## 2017-02-18 DIAGNOSIS — M6740 Ganglion, unspecified site: Secondary | ICD-10-CM | POA: Diagnosis not present

## 2017-02-18 DIAGNOSIS — T148XXA Other injury of unspecified body region, initial encounter: Secondary | ICD-10-CM

## 2017-02-19 NOTE — Progress Notes (Signed)
Subjective: 46 year old female presents the office they for follow-up evaluation of right ankle pain. After the steroid last appointment she has had almost complete resolution in symptoms and she's not having any pain at this time to her ankle. She still notices the mass to some degree on the right ankle but is not causing any pain. She has not yet returned to the gym. She also notices similar symptoms in the left side but not anywhere near as significant as the right side. She denies any recent injury or trauma to the area and she has no redness or warmth that she has noticed that she has no new concerns today.  Denies any systemic complaints such as fevers, chills, nausea, vomiting. No acute changes since last appointment, and no other complaints at this time.   Objective: AAO x3, NAD DP/PT pulses palpable bilaterally, CRT less than 3 seconds There is continued edema to the lateral aspect of the right ankle soft tissue masses palpable, however smaller. There is no tenderness palpation lateral aspect of ankle. There is no discomfort on the ATFL. There is no erythema or increase in warmth.There is no gross ankle instability present. There is no pain with ankle joint range of motion or any crepitation. No pain with subtalar joint range of motion. No open lesions or pre-ulcerative lesions.  No pain with calf compression, swelling, warmth, erythema  MRI 01/04/2017 IMPRESSION: 1. 1.5 x 1.4 x 3.6 cm cystic mass along the anterolateral aspect of the ankle joint which appears to communicate with the joint space may reflect loculated effusion versus prominent enlarged joint capsule versus a ganglion cyst. 2. Complete anterior talofibular ligament tear.  Assessment: Ganglion cyst right ankle with complete ATFL tear.  Plan: -All treatment options discussed with the patient including all alternatives, risks, complications.  -At this point she is doing well she's not taking any pain. We will hold off any  surgical intervention in the ATFL tear is likely chronic. Recommended gradual return to activity and gradual return to the treadmill. Discussed with her start slow and increase activity as tolerated. Will follow-up next couple months. She is doing but she has any problems before then to call the office for further evaluation.  Celesta Gentile, DPM

## 2017-04-25 ENCOUNTER — Other Ambulatory Visit (HOSPITAL_BASED_OUTPATIENT_CLINIC_OR_DEPARTMENT_OTHER): Payer: BLUE CROSS/BLUE SHIELD

## 2017-04-25 ENCOUNTER — Ambulatory Visit (HOSPITAL_BASED_OUTPATIENT_CLINIC_OR_DEPARTMENT_OTHER): Payer: BLUE CROSS/BLUE SHIELD | Admitting: Family

## 2017-04-25 VITALS — BP 142/77 | HR 96 | Temp 98.7°F | Resp 19 | Wt 269.8 lb

## 2017-04-25 DIAGNOSIS — D72823 Leukemoid reaction: Secondary | ICD-10-CM

## 2017-04-25 DIAGNOSIS — D72829 Elevated white blood cell count, unspecified: Secondary | ICD-10-CM

## 2017-04-25 DIAGNOSIS — Z720 Tobacco use: Secondary | ICD-10-CM

## 2017-04-25 LAB — CBC WITH DIFFERENTIAL (CANCER CENTER ONLY)
BASO#: 0 10*3/uL (ref 0.0–0.2)
BASO%: 0.3 % (ref 0.0–2.0)
EOS%: 0.9 % (ref 0.0–7.0)
Eosinophils Absolute: 0.1 10*3/uL (ref 0.0–0.5)
HEMATOCRIT: 46.8 % — AB (ref 34.8–46.6)
HGB: 16.3 g/dL — ABNORMAL HIGH (ref 11.6–15.9)
LYMPH#: 3.1 10*3/uL (ref 0.9–3.3)
LYMPH%: 24.4 % (ref 14.0–48.0)
MCH: 31.3 pg (ref 26.0–34.0)
MCHC: 34.8 g/dL (ref 32.0–36.0)
MCV: 90 fL (ref 81–101)
MONO#: 0.6 10*3/uL (ref 0.1–0.9)
MONO%: 4.8 % (ref 0.0–13.0)
NEUT%: 69.6 % (ref 39.6–80.0)
NEUTROS ABS: 8.8 10*3/uL — AB (ref 1.5–6.5)
Platelets: 243 10*3/uL (ref 145–400)
RBC: 5.21 10*6/uL (ref 3.70–5.32)
RDW: 12.7 % (ref 11.1–15.7)
WBC: 12.7 10*3/uL — AB (ref 3.9–10.0)

## 2017-04-25 LAB — CHCC SATELLITE - SMEAR

## 2017-04-25 NOTE — Progress Notes (Signed)
Hematology and Oncology Follow Up Visit  Jenna Pope [id number] [birth year] 46 y.o. 04/25/2017   Principle Diagnosis:  Leukocytosis - leukemoid reaction  Current Therapy:   Observation   Interim History:  Jenna Pope is here today with her husband for follow-up. She is doing quite well and has no complaints at this time.  Her WBC count is 12.7. She denies having had any issue with frequent infections.  She is a smoker and has occasional sinusitis.  No fever, chills, n/v, cough, rash, dizziness, SOB, chest pain, palpitations, abdominal pain or changes in bowel or bladder habits.  No numbness or tingling in her extremities. No c/o pain.  She has a ganglionic cyst on the right ankle and has received a steroid injection with Dr. Jacqualyn Posey which she states helped.  She has maintained a good appetite and is staying well hydrated. Her weight is stable.   ECOG Performance Status: 0 - Asymptomatic  Medications:  Allergies as of 04/25/2017      Reactions   Ace Inhibitors    Cough + feels like she has a lump in her throat.       Medication List       Accurate as of 04/25/17  3:53 PM. Always use your most recent med list.          albuterol 108 (90 Base) MCG/ACT inhaler Commonly known as:  PROVENTIL HFA;VENTOLIN HFA Inhale 2 puffs into the lungs every 6 (six) hours as needed for wheezing or shortness of breath.   ALPRAZolam 0.5 MG tablet Commonly known as:  XANAX TAKE 1 TABLET BY MOUTH TWICE A DAY AS NEEDED FOR ANXIETY   benzonatate 100 MG capsule Commonly known as:  TESSALON Take 1 capsule (100 mg total) by mouth 2 (two) times daily as needed for cough.   cholecalciferol 1000 units tablet Commonly known as:  VITAMIN D Take 5,000 Units by mouth daily.   doxycycline 100 MG capsule Commonly known as:  VIBRAMYCIN Take 1 capsule (100 mg total) by mouth 2 (two) times daily.   esomeprazole 40 MG capsule Commonly known as:  NEXIUM Take 40 mg by mouth daily as needed (acid reflux).     hydrochlorothiazide 12.5 MG tablet Commonly known as:  HYDRODIURIL Take 1 tablet (12.5 mg total) by mouth daily.   ibuprofen 200 MG tablet Commonly known as:  ADVIL,MOTRIN Take 400 mg by mouth every 6 (six) hours as needed for moderate pain.   loratadine 10 MG tablet Commonly known as:  CLARITIN Take 1 tablet (10 mg total) by mouth daily.   losartan 100 MG tablet Commonly known as:  COZAAR Take 1 tablet (100 mg total) by mouth daily.   meloxicam 15 MG tablet Commonly known as:  MOBIC Take 1 tablet (15 mg total) by mouth daily.   multivitamin tablet Take 1 tablet by mouth daily.   Phentermine HCl 8 MG Tabs Commonly known as:  LOMAIRA Take 1 tablet by mouth 3 (three) times daily before meals. 30 minutes prior to eating   TURMERIC PO Take 1 tablet by mouth at bedtime.   Vitamin D (Ergocalciferol) 50000 units Caps capsule Commonly known as:  DRISDOL Take 1 capsule (50,000 Units total) by mouth every 7 (seven) days.       Allergies:  Allergies  Allergen Reactions  . Ace Inhibitors     Cough + feels like she has a lump in her throat.     Past Medical History, Surgical history, Social history, and Family History were reviewed and  updated.  Review of Systems: All other 10 point review of systems is negative.   Physical Exam:  vitals were not taken for this visit.  Wt Readings from Last 3 Encounters:  12/26/16 286 lb 6 oz (129.9 kg)  12/13/16 264 lb (119.7 kg)  09/06/16 251 lb (113.9 kg)    Ocular: Sclerae unicteric, pupils equal, round and reactive to light Ear-nose-throat: Oropharynx clear, dentition fair Lymphatic: No cervical, supraclavicular or axillary adenopathy Lungs no rales or rhonchi, good excursion bilaterally Heart regular rate and rhythm, no murmur appreciated Abd soft, nontender, positive bowel sounds, no liver or spleen tip palpated on exam, no fluid wave  MSK no focal spinal tenderness, no joint edema Neuro: non-focal, well-oriented,  appropriate affect Breasts: Deferred   Lab Results  Component Value Date   WBC 12.7 (H) 04/25/2017   HGB 16.3 (H) 04/25/2017   HCT 46.8 (H) 04/25/2017   MCV 90 04/25/2017   PLT 243 04/25/2017   No results found for: FERRITIN, IRON, TIBC, UIBC, IRONPCTSAT Lab Results  Component Value Date   RBC 5.21 04/25/2017   No results found for: KPAFRELGTCHN, LAMBDASER, KAPLAMBRATIO No results found for: IGGSERUM, IGA, IGMSERUM No results found for: Odetta Pink, SPEI   Chemistry      Component Value Date/Time   NA 138 12/26/2016 0841   K 4.4 12/26/2016 0841   CL 106 12/26/2016 0841   CO2 26 12/26/2016 0841   BUN 8 12/26/2016 0841   CREATININE 0.67 12/26/2016 0841   CREATININE 0.77 04/08/2014 1053      Component Value Date/Time   CALCIUM 9.3 12/26/2016 0841   ALKPHOS 69 12/26/2016 0841   AST 18 12/26/2016 0841   ALT 19 12/26/2016 0841   BILITOT 0.4 12/26/2016 0841      Impression and Plan: Jenna Pope is a very pleasant 46 yo caucasian female with reactive leukocytosis. She is a smoker. Her WBC count today is 12.7 and she is asymptomatic at this time. Her white cell differential is unremarkable.  We will continue to follow along with her and plan to see her back again in another 4 months for lab and MD visit.  She will contact our office with any questions or concerns. We can certainly see her sooner if need be.   Eliezer Bottom, NP 9/14/20183:53 PM

## 2017-04-28 LAB — LACTATE DEHYDROGENASE: LDH: 197 U/L (ref 125–245)

## 2017-04-29 ENCOUNTER — Ambulatory Visit: Payer: BLUE CROSS/BLUE SHIELD | Admitting: Podiatry

## 2017-05-06 ENCOUNTER — Ambulatory Visit (INDEPENDENT_AMBULATORY_CARE_PROVIDER_SITE_OTHER): Payer: BLUE CROSS/BLUE SHIELD | Admitting: Podiatry

## 2017-05-06 DIAGNOSIS — M6740 Ganglion, unspecified site: Secondary | ICD-10-CM

## 2017-05-07 DIAGNOSIS — M6740 Ganglion, unspecified site: Secondary | ICD-10-CM | POA: Insufficient documentation

## 2017-05-07 NOTE — Progress Notes (Signed)
Subjective: Ms Peavler presents the office in follow-up evaluation of right lateral ankle pain. She states that she is only concerned about the cyst to the area just asking if we can try to drain fluid off of this again "pop" it. She has been able to do more exercising that she was previously. She states she gets some occasional discomfort with exercise but overall she is doing much better. She is actually been able to get back on a treadmill doing a little bit of exercise which she has not been able to do. She states that she wishes to hold off any surgical intervention to repair the ligament was torn to the also positive for ankle.Denies any systemic complaints such as fevers, chills, nausea, vomiting. No acute changes since last appointment, and no other complaints at this time.   Objective: AAO x3, NAD DP/PT pulses palpable bilaterally, CRT less than 3 seconds Along the lateral aspect of the right ankle is a fluid-filled mobile soft tissue mass consistent with a ganglion cyst and there is mild tennis palpation of this area. Unable to identify any significant tenderness on the ATFL however this does overlap with sister was difficult to evaluate. Is no other area of tenderness. Is no ankle instability present. No open lesions or pre-ulcerative lesions.  No pain with calf compression, swelling, warmth, erythema  Assessment: Ganglion cyst right lateral ankle  Plan: -All treatment options discussed with the patient including all alternatives, risks, complications.  -Patient went to pursue the aspiration of the area today. Obesity risks complications we proceeded with this. Under standard conditions a mix of lidocaine and Marcaine plain was infiltrated in a regional block fashion. Once anesthetized the skin was prepped with Betadine. An 18-gauge single was utilized to aspirate the area but no fluid was aspirated. Mixture of 5mg  Kenalog and local anesthetic was infiltrated directly into the area of the  cyst. Post injection care was discussed and a compression bandage was applied. -Discussed the possible surgical excision assistant she'll consider her options. -Patient encouraged to call the office with any questions, concerns, change in symptoms.   Celesta Gentile, DPM

## 2017-05-23 DIAGNOSIS — Z23 Encounter for immunization: Secondary | ICD-10-CM | POA: Diagnosis not present

## 2017-05-28 DIAGNOSIS — N760 Acute vaginitis: Secondary | ICD-10-CM | POA: Diagnosis not present

## 2017-05-28 DIAGNOSIS — N924 Excessive bleeding in the premenopausal period: Secondary | ICD-10-CM | POA: Diagnosis not present

## 2017-05-28 DIAGNOSIS — Z6841 Body Mass Index (BMI) 40.0 and over, adult: Secondary | ICD-10-CM | POA: Diagnosis not present

## 2017-05-28 DIAGNOSIS — Z01419 Encounter for gynecological examination (general) (routine) without abnormal findings: Secondary | ICD-10-CM | POA: Diagnosis not present

## 2017-05-28 DIAGNOSIS — R102 Pelvic and perineal pain: Secondary | ICD-10-CM | POA: Diagnosis not present

## 2017-06-03 ENCOUNTER — Other Ambulatory Visit: Payer: Self-pay | Admitting: Family Medicine

## 2017-06-03 MED ORDER — ALPRAZOLAM 0.5 MG PO TABS: ORAL_TABLET | ORAL | 0 refills | Status: DC

## 2017-06-03 NOTE — Telephone Encounter (Signed)
Last OV 12/26/16 Alprazolam last filled 12/26/16 #90 with 0

## 2017-06-03 NOTE — Telephone Encounter (Signed)
Medication filled to pharmacy as requested.   

## 2017-06-05 DIAGNOSIS — K219 Gastro-esophageal reflux disease without esophagitis: Secondary | ICD-10-CM | POA: Diagnosis not present

## 2017-06-05 DIAGNOSIS — I10 Essential (primary) hypertension: Secondary | ICD-10-CM | POA: Diagnosis not present

## 2017-06-05 DIAGNOSIS — M1990 Unspecified osteoarthritis, unspecified site: Secondary | ICD-10-CM | POA: Diagnosis not present

## 2017-06-05 DIAGNOSIS — E669 Obesity, unspecified: Secondary | ICD-10-CM | POA: Diagnosis not present

## 2017-06-30 ENCOUNTER — Other Ambulatory Visit: Payer: Self-pay

## 2017-06-30 ENCOUNTER — Encounter: Payer: Self-pay | Admitting: Family Medicine

## 2017-06-30 ENCOUNTER — Other Ambulatory Visit: Payer: Self-pay | Admitting: General Practice

## 2017-06-30 ENCOUNTER — Ambulatory Visit: Payer: BLUE CROSS/BLUE SHIELD | Admitting: Family Medicine

## 2017-06-30 VITALS — BP 123/83 | HR 105 | Resp 16 | Ht 62.0 in | Wt 273.4 lb

## 2017-06-30 DIAGNOSIS — R300 Dysuria: Secondary | ICD-10-CM | POA: Diagnosis not present

## 2017-06-30 DIAGNOSIS — I10 Essential (primary) hypertension: Secondary | ICD-10-CM | POA: Diagnosis not present

## 2017-06-30 DIAGNOSIS — F418 Other specified anxiety disorders: Secondary | ICD-10-CM

## 2017-06-30 DIAGNOSIS — R42 Dizziness and giddiness: Secondary | ICD-10-CM | POA: Insufficient documentation

## 2017-06-30 LAB — POCT URINALYSIS DIPSTICK
Bilirubin, UA: NEGATIVE
Glucose, UA: NEGATIVE
KETONES UA: NEGATIVE
Nitrite, UA: NEGATIVE
PH UA: 6.5 (ref 5.0–8.0)
SPEC GRAV UA: 1.02 (ref 1.010–1.025)
Urobilinogen, UA: 0.2 E.U./dL

## 2017-06-30 MED ORDER — MECLIZINE HCL 25 MG PO TABS: 25.0000 mg | ORAL_TABLET | Freq: Three times a day (TID) | ORAL | 0 refills | Status: DC | PRN

## 2017-06-30 MED ORDER — VORTIOXETINE HBR 10 MG PO TABS: 10.0000 mg | ORAL_TABLET | Freq: Every day | ORAL | 3 refills | Status: DC

## 2017-06-30 MED ORDER — MECLIZINE HCL 32 MG PO TABS: 32.0000 mg | ORAL_TABLET | Freq: Three times a day (TID) | ORAL | 0 refills | Status: DC | PRN

## 2017-06-30 NOTE — Patient Instructions (Signed)
Follow up in 1 month to recheck anxiety We'll notify you of your lab results and make any changes if needed Start the Trintellix once daily (this may require a prior authorization- we'll take care of it but it may take a few days) Use the Alprazolam as needed for panicked moments HOLD the HCTZ for possible dehydration.  If your BP is consistently >140/90, restart the HCTZ Increase your water intake Use the Meclizine as needed for dizziness Make sure you are taking Claritin or Zyrtec daily to help with your dizziness Call with any questions or concerns Happy Thanksgiving!!!

## 2017-06-30 NOTE — Progress Notes (Signed)
   Subjective:    Patient ID: Jenna Pope, female    DOB: 02-18-1971, 46 y.o.   MRN: [id number]  HPI HTN- chronic problem, on HCTZ and Losartan daily w/ good control.  Denies CP, SOB, HAs, visual changes, edema.  Wants to hold HCTZ due to dehydration.  'my anxiety is through the roof'- pt feels that she just needs to increase her xanax use.  She is currently 'only taking as needed'.  Pt is taking 'at least 4-5 days/week'.  Has hysterectomy upcoming in December.  Pt reports work is less stressful than previously.  Pt reports Effexor made her gain weight.  Pt reports 'a lot of dizziness when i'm at work' which she thinks might coincide w/ anxiety.  She reports dizziness worsens when she turns her head rapidly- worse in the past 2-3 weeks.  Obesity- BMI is now 50.00 as pt continues to gain weight.  Not exercising or following a particular diet   Review of Systems For ROS see HPI     Objective:   Physical Exam  Constitutional: She is oriented to person, place, and time. She appears well-developed and well-nourished. No distress.  obese  HENT:  Head: Normocephalic and atraumatic.  Mouth/Throat: Uvula is midline and mucous membranes are normal.  TMs WNL No TTP over sinuses Minimal nasal congestion  Eyes: Conjunctivae and EOM are normal. Pupils are equal, round, and reactive to light.  2-3 beats of horizontal nystagmus when looking L  Neck: Normal range of motion. Neck supple.  Cardiovascular: Normal rate, regular rhythm, normal heart sounds and intact distal pulses.  Pulmonary/Chest: Effort normal and breath sounds normal. No respiratory distress. She has no wheezes. She has no rales.  Musculoskeletal: She exhibits no edema.  Lymphadenopathy:    She has no cervical adenopathy.  Neurological: She is alert and oriented to person, place, and time. She has normal reflexes. No cranial nerve deficit.  Skin: Skin is warm and dry.  Psychiatric: She has a normal mood and affect. Her behavior is  normal. Judgment and thought content normal.  Vitals reviewed.         Assessment & Plan:

## 2017-06-30 NOTE — Assessment & Plan Note (Signed)
Chronic problem.  Adequate control today.  Pt wants to hold HCTZ due to subjective dehydration.  Instructed pt to monitor her BP and if consistently >140/90, pt to restart HCTZ.  Pt expressed understanding and is in agreement w/ plan.

## 2017-06-30 NOTE — Assessment & Plan Note (Signed)
Deteriorated.  Pt continues to gain weight and BMI is now 50.00  Stressed need for healthy diet and regular exercise.  Check labs to risk stratify.  Will follow.

## 2017-06-30 NOTE — Assessment & Plan Note (Signed)
Deteriorated.  Pt is now using Alprazolam ~5x/week and feels the need to increase her usage.  At this point, it is time to start a daily SSRI but pt is concerned about weight gain and other side effects.  Due to her difficulties w/ Effexor, will start Trintellix for the clean side effect profile.  Pt expressed understanding and is in agreement w/ plan.

## 2017-06-30 NOTE — Assessment & Plan Note (Signed)
New.  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  Start Meclizine prn.  Reviewed need for adequate hydration and changing positions slowly.  Reviewed supportive care and red flags that should prompt return.  Pt expressed understanding and is in agreement w/ plan.

## 2017-07-01 ENCOUNTER — Other Ambulatory Visit: Payer: Self-pay | Admitting: General Practice

## 2017-07-01 LAB — CBC WITH DIFFERENTIAL/PLATELET
BASOS PCT: 0.4 %
Basophils Absolute: 56 cells/uL (ref 0–200)
EOS ABS: 125 {cells}/uL (ref 15–500)
EOS PCT: 0.9 %
HCT: 44.7 % (ref 35.0–45.0)
HEMOGLOBIN: 15.5 g/dL (ref 11.7–15.5)
Lymphs Abs: 2752 cells/uL (ref 850–3900)
MCH: 30.4 pg (ref 27.0–33.0)
MCHC: 34.7 g/dL (ref 32.0–36.0)
MCV: 87.6 fL (ref 80.0–100.0)
MONOS PCT: 5.7 %
MPV: 10.3 fL (ref 7.5–12.5)
NEUTROS ABS: 10175 {cells}/uL — AB (ref 1500–7800)
Neutrophils Relative %: 73.2 %
PLATELETS: 285 10*3/uL (ref 140–400)
RBC: 5.1 10*6/uL (ref 3.80–5.10)
RDW: 12.7 % (ref 11.0–15.0)
TOTAL LYMPHOCYTE: 19.8 %
WBC mixed population: 792 cells/uL (ref 200–950)
WBC: 13.9 10*3/uL — AB (ref 3.8–10.8)

## 2017-07-01 LAB — BASIC METABOLIC PANEL
BUN: 10 mg/dL (ref 7–25)
CO2: 24 mmol/L (ref 20–32)
CREATININE: 0.59 mg/dL (ref 0.50–1.10)
Calcium: 9.4 mg/dL (ref 8.6–10.2)
Chloride: 106 mmol/L (ref 98–110)
Glucose, Bld: 102 mg/dL — ABNORMAL HIGH (ref 65–99)
Potassium: 4.3 mmol/L (ref 3.5–5.3)
Sodium: 139 mmol/L (ref 135–146)

## 2017-07-01 LAB — HEPATIC FUNCTION PANEL
AG Ratio: 1.5 (calc) (ref 1.0–2.5)
ALBUMIN MSPROF: 4.1 g/dL (ref 3.6–5.1)
ALT: 22 U/L (ref 6–29)
AST: 21 U/L (ref 10–35)
Alkaline phosphatase (APISO): 70 U/L (ref 33–115)
BILIRUBIN TOTAL: 0.4 mg/dL (ref 0.2–1.2)
Bilirubin, Direct: 0.1 mg/dL (ref 0.0–0.2)
Globulin: 2.7 g/dL (calc) (ref 1.9–3.7)
Indirect Bilirubin: 0.3 mg/dL (calc) (ref 0.2–1.2)
TOTAL PROTEIN: 6.8 g/dL (ref 6.1–8.1)

## 2017-07-01 LAB — LIPID PANEL
CHOLESTEROL: 192 mg/dL (ref <200)
HDL: 29 mg/dL — ABNORMAL LOW (ref >50)
LDL CHOLESTEROL (CALC): 120 mg/dL — AB
Non-HDL Cholesterol (Calc): 163 mg/dL (calc) — ABNORMAL HIGH (ref <130)
Total CHOL/HDL Ratio: 6.6 (calc) — ABNORMAL HIGH (ref <5.0)
Triglycerides: 299 mg/dL — ABNORMAL HIGH (ref <150)

## 2017-07-01 LAB — TSH: TSH: 3.26 mIU/L

## 2017-07-01 LAB — HEMOGLOBIN A1C
EAG (MMOL/L): 6.5 (calc)
HEMOGLOBIN A1C: 5.7 %{Hb} — AB (ref <5.7)
MEAN PLASMA GLUCOSE: 117 (calc)

## 2017-07-01 MED ORDER — FENOFIBRATE 160 MG PO TABS: 160.0000 mg | ORAL_TABLET | Freq: Every day | ORAL | 6 refills | Status: DC

## 2017-07-02 ENCOUNTER — Telehealth: Payer: Self-pay | Admitting: Family Medicine

## 2017-07-02 LAB — URINE CULTURE
MICRO NUMBER: 81303810
RESULT: NO GROWTH
SPECIMEN QUALITY:: ADEQUATE

## 2017-07-02 NOTE — Telephone Encounter (Signed)
RX was sent over on 11/19 but for 25mg .    I have talked with the pharmacy and clarified that they do have the RX for 25mg .   Nothing further is needed.

## 2017-07-02 NOTE — Telephone Encounter (Signed)
Copied from [place]. Topic: General - Other >> Jul 02, 2017  1:17 PM Neva Seat wrote: Pt's  - Meclizine 32mg   Rx was sent on the 19th    [facility] in [place] - [phone] - 25mg  is the most the pharmacy has.  Wants a call back on what needs to be done.

## 2017-07-02 NOTE — Telephone Encounter (Signed)
Medication  Dosage  Change request

## 2017-07-09 DIAGNOSIS — R102 Pelvic and perineal pain: Secondary | ICD-10-CM | POA: Diagnosis not present

## 2017-07-09 DIAGNOSIS — D259 Leiomyoma of uterus, unspecified: Secondary | ICD-10-CM | POA: Diagnosis not present

## 2017-08-18 ENCOUNTER — Ambulatory Visit: Payer: Self-pay | Admitting: Family Medicine

## 2017-08-18 NOTE — Patient Instructions (Signed)
Your procedure is scheduled on:  Monday, Jan 21  Enter through the Main Entrance of [facility] at:  6 am  Pick up the phone at the desk and dial [phone].  Call this number if you have problems the morning of surgery: [phone].  Remember: Do NOT eat or Do NOT drink clear liquids (including water) after midnight Sunday  Take these medicines the morning of surgery with a SIP OF WATER: fenofibrate, trintellix, claritin and xanax if needed  Bring albuterol inhaler with you on day of surgery.  Do Not smoke on the day of surgery.  Stop herbal medications and supplements at this time.  Do NOT wear jewelry (body piercing), metal hair clips/bobby pins, make-up, or nail polish. Do NOT wear lotions, powders, or perfumes.  You may wear deoderant. Do NOT shave for 48 hours prior to surgery. Do NOT bring valuables to the hospital. Contacts, dentures, or bridgework may not be worn into surgery.  Leave suitcase in car.  After surgery it may be brought to your room.  For patients admitted to the hospital, checkout time is 11:00 AM the day of discharge. Have a responsible adult drive you home and stay with you for 24 hours after your procedure.

## 2017-08-22 ENCOUNTER — Inpatient Hospital Stay (HOSPITAL_COMMUNITY)
Admission: RE | Admit: 2017-08-22 | Discharge: 2017-08-22 | Disposition: A | Discharge status: Home / Self Care | Payer: Self-pay | Source: Ambulatory Visit

## 2017-08-25 ENCOUNTER — Inpatient Hospital Stay: Payer: BLUE CROSS/BLUE SHIELD | Attending: Hematology & Oncology

## 2017-08-25 ENCOUNTER — Other Ambulatory Visit: Payer: Self-pay

## 2017-08-25 ENCOUNTER — Inpatient Hospital Stay (HOSPITAL_BASED_OUTPATIENT_CLINIC_OR_DEPARTMENT_OTHER): Payer: BLUE CROSS/BLUE SHIELD | Admitting: Family

## 2017-08-25 VITALS — BP 121/102 | HR 99 | Temp 97.9°F | Resp 20 | Wt 276.0 lb

## 2017-08-25 DIAGNOSIS — Z720 Tobacco use: Secondary | ICD-10-CM | POA: Diagnosis not present

## 2017-08-25 DIAGNOSIS — M1990 Unspecified osteoarthritis, unspecified site: Secondary | ICD-10-CM | POA: Insufficient documentation

## 2017-08-25 DIAGNOSIS — F419 Anxiety disorder, unspecified: Secondary | ICD-10-CM | POA: Diagnosis not present

## 2017-08-25 DIAGNOSIS — R0609 Other forms of dyspnea: Secondary | ICD-10-CM

## 2017-08-25 DIAGNOSIS — D72823 Leukemoid reaction: Secondary | ICD-10-CM

## 2017-08-25 DIAGNOSIS — D72829 Elevated white blood cell count, unspecified: Secondary | ICD-10-CM | POA: Diagnosis not present

## 2017-08-25 LAB — CBC WITH DIFFERENTIAL (CANCER CENTER ONLY)
Basophils Absolute: 0 10*3/uL (ref 0.0–0.1)
Basophils Relative: 0 %
EOS ABS: 0.1 10*3/uL (ref 0.0–0.5)
Eosinophils Relative: 1 %
HCT: 42.2 % (ref 34.8–46.6)
HEMOGLOBIN: 14.6 g/dL (ref 11.6–15.9)
LYMPHS PCT: 26 %
Lymphs Abs: 3.7 10*3/uL — ABNORMAL HIGH (ref 0.9–3.3)
MCH: 31.5 pg (ref 26.0–34.0)
MCHC: 34.6 g/dL (ref 32.0–36.0)
MCV: 91.1 fL (ref 81.0–101.0)
Monocytes Absolute: 0.7 10*3/uL (ref 0.1–0.9)
Monocytes Relative: 5 %
NEUTROS ABS: 9.7 10*3/uL — AB (ref 1.5–6.5)
NEUTROS PCT: 68 %
Platelet Count: 263 10*3/uL (ref 145–400)
RBC: 4.63 MIL/uL (ref 3.70–5.32)
RDW: 12.4 % (ref 11.1–15.7)
WBC: 14.2 10*3/uL — AB (ref 3.9–10.3)

## 2017-08-25 NOTE — Progress Notes (Signed)
Hematology and Oncology Follow Up Visit  Jenna Pope [id number] 07-15-1971 47 y.o. 08/25/2017   Principle Diagnosis:  Leukocytosis - leukemoid reaction  Current Therapy:   Observation   Interim History:  Jenna Pope is here today for follow-up. She is doing well and has no complaints at this time.  She is still smoking 1 ppd. Her SOB with over exertion is unchanged. WBC count is stable at 14.2.  She has had no issues with infection. No fever, chills, n/v, cough, rash, dizziness, SOB, chest pain, abdominal pain or changes in bowel or bladder habits.  She states that she has heavy cycles due to fibroids and was supposed to have a hysterectomy this month. She cancelled the surgery and plans to do it later this year.  She has had no other episodes of bleeding, no bruising or petechiae.  No lymphadenopathy found on exam.  She has occasional palpitations she states is due to anxiety. She was a little nervous today. Her BP read high. I offered to recheck manually but she declined and plans to check herself at home.  She has intermittent puffiness in her feet and ankles. This improves with elevating her legs.  The numbness or tingling in her left hand due to carpal tunnel and arthritis is unchanged.  She has maintained a good appetite and is staying well hydrated. Her weight is stable.   ECOG Performance Status: 0 - Asymptomatic  Medications:  Allergies as of 08/25/2017      Reactions   Ace Inhibitors    Cough + feels like she has a lump in her throat.       Medication List        Accurate as of 08/25/17  3:34 PM. Always use your most recent med list.          albuterol 108 (90 Base) MCG/ACT inhaler Commonly known as:  PROVENTIL HFA;VENTOLIN HFA Inhale 2 puffs into the lungs every 6 (six) hours as needed for wheezing or shortness of breath.   ALPRAZolam 0.5 MG tablet Commonly known as:  XANAX TAKE 1 TABLET BY MOUTH TWICE A DAY AS NEEDED FOR ANXIETY   cholecalciferol 1000 units  tablet Commonly known as:  VITAMIN D Take 5,000 Units by mouth daily.   fenofibrate 160 MG tablet Take 1 tablet (160 mg total) by mouth daily.   hydrochlorothiazide 12.5 MG tablet Commonly known as:  HYDRODIURIL Take 1 tablet (12.5 mg total) by mouth daily.   ibuprofen 200 MG tablet Commonly known as:  ADVIL,MOTRIN Take 400 mg by mouth every 6 (six) hours as needed for moderate pain.   loratadine 10 MG tablet Commonly known as:  CLARITIN Take 1 tablet (10 mg total) by mouth daily.   losartan 100 MG tablet Commonly known as:  COZAAR Take 1 tablet (100 mg total) by mouth daily.   meclizine 25 MG tablet Commonly known as:  ANTIVERT Take 1 tablet (25 mg total) 3 (three) times daily as needed by mouth for dizziness.   multivitamin tablet Take 1 tablet by mouth daily.   Phentermine HCl 8 MG Tabs Commonly known as:  LOMAIRA Take 1 tablet by mouth 3 (three) times daily before meals. 30 minutes prior to eating   vortioxetine HBr 10 MG Tabs Commonly known as:  TRINTELLIX Take 1 tablet (10 mg total) daily by mouth.       Allergies:  Allergies  Allergen Reactions  . Ace Inhibitors     Cough + feels like she has a lump in  her throat.     Past Medical History, Surgical history, Social history, and Family History were reviewed and updated.  Review of Systems: All other 10 point review of systems is negative.   Physical Exam:  vitals were not taken for this visit.   Wt Readings from Last 3 Encounters:  06/30/17 273 lb 6 oz (124 kg)  04/25/17 269 lb 12.8 oz (122.4 kg)  12/26/16 286 lb 6 oz (129.9 kg)    Ocular: Sclerae unicteric, pupils equal, round and reactive to light Ear-nose-throat: Oropharynx clear, dentition fair Lymphatic: No cervical, supraclavicular or axillary adenopathy Lungs no rales or rhonchi, good excursion bilaterally Heart regular rate and rhythm, no murmur appreciated Abd soft, nontender, positive bowel sounds, no liver or spleen tip palpated on  exam, no fluid wave  MSK no focal spinal tenderness, no joint edema Neuro: non-focal, well-oriented, appropriate affect Breasts: Deferred   Lab Results  Component Value Date   WBC 13.9 (H) 06/30/2017   HGB 15.5 06/30/2017   HCT 42.2 08/25/2017   MCV 91.1 08/25/2017   PLT 285 06/30/2017   No results found for: FERRITIN, IRON, TIBC, UIBC, IRONPCTSAT Lab Results  Component Value Date   RBC 4.63 08/25/2017   No results found for: KPAFRELGTCHN, LAMBDASER, KAPLAMBRATIO No results found for: IGGSERUM, IGA, IGMSERUM No results found for: Ronnald Ramp, A1GS, A2GS, Tillman Sers, SPEI   Chemistry      Component Value Date/Time   NA 139 06/30/2017 1536   K 4.3 06/30/2017 1536   CL 106 06/30/2017 1536   CO2 24 06/30/2017 1536   BUN 10 06/30/2017 1536   CREATININE 0.59 06/30/2017 1536      Component Value Date/Time   CALCIUM 9.4 06/30/2017 1536   ALKPHOS 69 12/26/2016 0841   AST 21 06/30/2017 1536   ALT 22 06/30/2017 1536   BILITOT 0.4 06/30/2017 1536      Impression and Plan: Jenna Pope is a very pleasant 47 yo caucasian female with reactive leukocytosis. She is a smoker and also has arthritis.  She is doing well and has no complaints at this time. WBC count is stable at 14.2. NO anemia and platelet count stable at 263.  We will continue to follow along with her and plan to see her back in another 6 months. She will contact our office with any questions or concerns. We can certainly see her sooner if need be.   Laverna Peace, NP 1/14/20193:34 PM

## 2017-08-26 LAB — LACTATE DEHYDROGENASE: LDH: 227 U/L (ref 125–245)

## 2017-08-28 LAB — SAVE SMEAR

## 2017-09-01 ENCOUNTER — Ambulatory Visit (HOSPITAL_COMMUNITY)
Admission: AD | Admit: 2017-09-01 | Payer: BLUE CROSS/BLUE SHIELD | Source: Ambulatory Visit | Admitting: Obstetrics & Gynecology

## 2017-09-01 ENCOUNTER — Encounter (HOSPITAL_COMMUNITY): Admission: AD | Payer: Self-pay | Source: Ambulatory Visit

## 2017-09-01 SURGERY — HYSTERECTOMY, VAGINAL, LAPAROSCOPY-ASSISTED, WITH SALPINGO-OOPHORECTOMY
Anesthesia: General | Laterality: Bilateral

## 2017-09-08 ENCOUNTER — Encounter (HOSPITAL_COMMUNITY): Payer: Self-pay

## 2017-09-10 DIAGNOSIS — J302 Other seasonal allergic rhinitis: Secondary | ICD-10-CM | POA: Diagnosis not present

## 2017-09-10 DIAGNOSIS — M1990 Unspecified osteoarthritis, unspecified site: Secondary | ICD-10-CM | POA: Diagnosis not present

## 2017-09-10 DIAGNOSIS — I10 Essential (primary) hypertension: Secondary | ICD-10-CM | POA: Diagnosis not present

## 2017-10-10 ENCOUNTER — Ambulatory Visit: Payer: BLUE CROSS/BLUE SHIELD | Admitting: Family Medicine

## 2017-10-10 ENCOUNTER — Other Ambulatory Visit: Payer: Self-pay | Admitting: General Practice

## 2017-10-10 ENCOUNTER — Other Ambulatory Visit: Payer: Self-pay

## 2017-10-10 ENCOUNTER — Encounter: Payer: Self-pay | Admitting: Family Medicine

## 2017-10-10 VITALS — BP 121/86 | HR 80 | Temp 98.2°F | Resp 16 | Ht 62.0 in | Wt 281.4 lb

## 2017-10-10 DIAGNOSIS — F418 Other specified anxiety disorders: Secondary | ICD-10-CM

## 2017-10-10 DIAGNOSIS — E781 Pure hyperglyceridemia: Secondary | ICD-10-CM | POA: Diagnosis not present

## 2017-10-10 DIAGNOSIS — E559 Vitamin D deficiency, unspecified: Secondary | ICD-10-CM

## 2017-10-10 LAB — LIPID PANEL
CHOL/HDL RATIO: 4
Cholesterol: 115 mg/dL (ref 0–200)
HDL: 25.7 mg/dL — ABNORMAL LOW (ref >39.00)
LDL CALC: 70 mg/dL (ref 0–99)
NonHDL: 89.64
TRIGLYCERIDES: 100 mg/dL (ref 0.0–149.0)
VLDL: 20 mg/dL (ref 0.0–40.0)

## 2017-10-10 LAB — HEPATIC FUNCTION PANEL
ALT: 27 U/L (ref 0–35)
AST: 22 U/L (ref 0–37)
Albumin: 3.7 g/dL (ref 3.5–5.2)
Alkaline Phosphatase: 51 U/L (ref 39–117)
BILIRUBIN DIRECT: 0.2 mg/dL (ref 0.0–0.3)
BILIRUBIN TOTAL: 0.7 mg/dL (ref 0.2–1.2)
TOTAL PROTEIN: 6.4 g/dL (ref 6.0–8.3)

## 2017-10-10 LAB — VITAMIN D 25 HYDROXY (VIT D DEFICIENCY, FRACTURES): VITD: 22.76 ng/mL — ABNORMAL LOW (ref 30.00–100.00)

## 2017-10-10 MED ORDER — ALPRAZOLAM 0.5 MG PO TABS: ORAL_TABLET | ORAL | 0 refills | Status: DC

## 2017-10-10 MED ORDER — VITAMIN D (ERGOCALCIFEROL) 1.25 MG (50000 UNIT) PO CAPS: 50000.0000 [IU] | ORAL_CAPSULE | ORAL | 0 refills | Status: DC

## 2017-10-10 NOTE — Patient Instructions (Addendum)
Schedule your complete physical in 6 months We'll notify you of your lab results and make any changes if needed Continue the Trintellix daily- I'm so glad it's working! Continue to work on healthy diet and regular exercise- you can do it!! Call with any questions or concerns Have a great weekend!!

## 2017-10-10 NOTE — Assessment & Plan Note (Signed)
Much better since starting Trintellix.  No med changes at this time.  Alprazolam refilled but pt is using this much less.

## 2017-10-10 NOTE — Progress Notes (Signed)
   Subjective:    Patient ID: Jenna Pope, female    DOB: April 23, 1971, 47 y.o.   MRN: [id number]  HPI Depression- pt is now on Trintellix.  'It's actually working really well'.  Pt was fearful of taking it so it took her awhile to get started.  Pt reports her anxiety is MUCH better, rarely having to use Alprazolam.  Pt reports brain fog has improved.  No longer having panic attacks, dizziness has improved.  Pt has been taking medication for 1 month.  Hypertriglyceridemia- pt's now taking Fenofibrate due to triglyceride level of 299.  No abd pain, N/V.  Vit D deficiency- due for repeat labs.  Vit D was 18.96 in May 2018.   Review of Systems For ROS see HPI     Objective:   Physical Exam  Constitutional: She is oriented to person, place, and time. She appears well-developed and well-nourished. No distress.  obese  HENT:  Head: Normocephalic and atraumatic.  Neurological: She is alert and oriented to person, place, and time.  Skin: Skin is warm and dry.  Psychiatric: She has a normal mood and affect. Her behavior is normal. Thought content normal.  Vitals reviewed.         Assessment & Plan:

## 2017-10-10 NOTE — Assessment & Plan Note (Signed)
Pt was started on Fenofibrate at last visit.  Tolerating this w/o difficulty.  Check labs.  Adjust meds prn

## 2017-10-10 NOTE — Assessment & Plan Note (Signed)
Due for repeat labs.  Replete prn.

## 2017-10-10 NOTE — Assessment & Plan Note (Signed)
Ongoing issue.  Pt has gained 5 lbs since last visit.  Stressed need for healthy diet and regular exercise.  Will follow.

## 2017-10-15 DIAGNOSIS — M170 Bilateral primary osteoarthritis of knee: Secondary | ICD-10-CM | POA: Diagnosis not present

## 2018-01-01 ENCOUNTER — Other Ambulatory Visit: Payer: Self-pay

## 2018-01-01 ENCOUNTER — Ambulatory Visit: Payer: BLUE CROSS/BLUE SHIELD | Admitting: Family Medicine

## 2018-01-01 ENCOUNTER — Encounter: Payer: Self-pay | Admitting: Family Medicine

## 2018-01-01 VITALS — BP 136/84 | HR 103 | Temp 98.8°F | Resp 17 | Ht 62.0 in | Wt 261.2 lb

## 2018-01-01 DIAGNOSIS — M542 Cervicalgia: Secondary | ICD-10-CM

## 2018-01-01 DIAGNOSIS — J329 Chronic sinusitis, unspecified: Secondary | ICD-10-CM | POA: Diagnosis not present

## 2018-01-01 DIAGNOSIS — B9689 Other specified bacterial agents as the cause of diseases classified elsewhere: Secondary | ICD-10-CM | POA: Diagnosis not present

## 2018-01-01 MED ORDER — AMOXICILLIN 875 MG PO TABS: 875.0000 mg | ORAL_TABLET | Freq: Two times a day (BID) | ORAL | 0 refills | Status: DC

## 2018-01-01 NOTE — Progress Notes (Signed)
   Subjective:    Patient ID: Jenna Pope, female    DOB: [birth year], 47 y.o.   MRN: [id number]  HPI URI- sxs started 2.5 weeks ago w/ nasal congestion, sinus pressure.  + HA.  + maxillary sinus pressure.  No tooth pain.  Intermittent ear pain.  No relief w/ ibuprofen or OTC sinus meds.  No known sick contacts.  No fevers.  Not currently taking Claritin.  Popping in L side of neck- sxs started ~2 weeks ago.  Will occur at random times, turning her head, swallowing.  Causes discomfort.  No known injury.  No decreased ROM.  No numbness/tingling associated w/ popping.   Review of Systems For ROS see HPI     Objective:   Physical Exam  Constitutional: She is oriented to person, place, and time. She appears well-developed and well-nourished. No distress.  HENT:  Head: Normocephalic and atraumatic.  Right Ear: Tympanic membrane normal.  Left Ear: Tympanic membrane normal.  Nose: Mucosal edema and rhinorrhea present. Right sinus exhibits maxillary sinus tenderness and frontal sinus tenderness. Left sinus exhibits maxillary sinus tenderness and frontal sinus tenderness.  Mouth/Throat: Uvula is midline and mucous membranes are normal. Posterior oropharyngeal erythema present. No oropharyngeal exudate.  Eyes: Pupils are equal, round, and reactive to light. Conjunctivae and EOM are normal.  Neck: Normal range of motion. Neck supple.  Cardiovascular: Normal rate, regular rhythm and normal heart sounds.  Pulmonary/Chest: Effort normal and breath sounds normal. No respiratory distress. She has no wheezes.  Lymphadenopathy:    She has no cervical adenopathy.  Neurological: She is alert and oriented to person, place, and time. No cranial nerve deficit.  Skin: Skin is warm and dry. No rash noted.  Vitals reviewed.         Assessment & Plan:  Bacterial sinusitis- new.  Pt's sxs and PE consistent w/ infxn.  Start abx.  Add daily antihistamine.  Reviewed supportive care and red flags that should  prompt return.    Neck pain- new.  No popping on PE today but this is very distressing to pt.  Will refer to Sports Med for a complete evaluation and tx.  Reviewed supportive care and red flags that should prompt return.  Pt expressed understanding and is in agreement w/ plan.

## 2018-01-01 NOTE — Patient Instructions (Signed)
Follow up as needed or as scheduled START the Amoxicillin twice daily- take w/ food RESTART an allergy medication- Claritin or Zyrtec Drink plenty of water We'll call you with your Sports Med appt Call with any questions or concerns Tatamy Day!!!

## 2018-01-19 NOTE — Progress Notes (Signed)
[facility] [place],  [id number] Phone: [phone] Subjective:    I'm seeing this patient by the request  of:  Midge Minium, MD   CC: Neck pain  TFT:[id number]  Jenna Pope is a 47 y.o. female coming in with complaint of neck pain. States her neck pops really loud. Neck gets stiff. States her neck also catches. Experiences light headaches. Recently had a sinus infection.   Onset- 1 month Location- bilateral but left is worse  Duration-usually has a daily.  Sometimes it is some for a split second with certain movements Character- sore Aggravating factors- rotation  Reliving factors- cream, advil Therapies tried-as stated above Severity-5 out of 10     Past Medical History:  Diagnosis Date  . Anxiety   . Arthritis    knees   . Carpal tunnel syndrome   . Family history of anesthesia complication    had an aunt cardiac arrest during a knee surgery  . GERD (gastroesophageal reflux disease)   . Hypertension   . Hypothyroidism   . Shortness of breath    with exertion   . Sleep apnea    uses cpap since 2/13  setting at 12    Past Surgical History:  Procedure Laterality Date  . BREAST REDUCTION SURGERY    . CARPAL TUNNEL RELEASE  04/15/2012   Procedure: CARPAL TUNNEL RELEASE;  Surgeon: Wynonia Sours, MD;  Location: [facility];  Service: Orthopedics;  Laterality: Right;  . LAPAROSCOPIC GASTRIC BANDING N/A 02/06/2015   Procedure: LAP BAND PLACEMENT;  Surgeon: Johnathan Hausen, MD;  Location: [facility];  Service: General;  Laterality: N/A;  . merina birth control surgical removal   2015   . OVARIAN CYST REMOVAL    . UPPER GASTROINTESTINAL ENDOSCOPY    . WISDOM TOOTH EXTRACTION     Social History   Socioeconomic History  . Marital status: Married    Spouse name: Not on file  . Number of children: Not on file  . Years of education: Not on file  . Highest education level: Not on file  Occupational History  .  Not on file  Social Needs  . Financial resource strain: Not on file  . Food insecurity:    Worry: Not on file    Inability: Not on file  . Transportation needs:    Medical: Not on file    Non-medical: Not on file  Tobacco Use  . Smoking status: Current Every Day Smoker    Packs/day: 0.50    Years: 20.00    Pack years: 10.00  . Smokeless tobacco: Never Used  Substance and Sexual Activity  . Alcohol use: Yes    Comment: rare  . Drug use: No  . Sexual activity: Not on file  Lifestyle  . Physical activity:    Days per week: Not on file    Minutes per session: Not on file  . Stress: Not on file  Relationships  . Social connections:    Talks on phone: Not on file    Gets together: Not on file    Attends religious service: Not on file    Active member of club or organization: Not on file    Attends meetings of clubs or organizations: Not on file    Relationship status: Not on file  Other Topics Concern  . Not on file  Social History Narrative  . Not on file   Allergies  Allergen Reactions  .  Ace Inhibitors     Cough + feels like she has a lump in her throat.    Family History  Problem Relation Age of Onset  . Arthritis Mother   . Alcohol abuse Mother   . Hyperlipidemia Father   . Heart disease Father   . Stroke Father   . Hypertension Father   . Cancer Son   . Arthritis Maternal Aunt   . Cancer Maternal Uncle   . Alcohol abuse Maternal Grandfather      Past medical history, social, surgical and family history all reviewed in electronic medical record.  No pertanent information unless stated regarding to the chief complaint.   Review of Systems:Review of systems updated and as accurate as of 01/20/18  Novisual changes, nausea, vomiting, diarrhea, constipation, dizziness, abdominal pain, skin rash, fevers, chills, night sweats, weight loss, swollen lymph nodes, body aches, joint swelling,, chest pain, shortness of breath, mood changes.  Mild positive headache and  muscle aches  Objective  Blood pressure 138/82, pulse (!) 102, height 5\' 2"  (1.575 m), weight 257 lb (116.6 kg), last menstrual period 12/27/2017, SpO2 98 %. Systems examined below as of 01/20/18   General: No apparent distress alert and oriented x3 mood and affect normal, dressed appropriately.  HEENT: Pupils equal, extraocular movements intact  Respiratory: Patient's speak in full sentences and does not appear short of breath  Cardiovascular: No lower extremity edema, non tender, no erythema  Skin: Warm dry intact with no signs of infection or rash on extremities or on axial skeleton.  Abdomen: Soft nontender  Neuro: Cranial nerves II through XII are intact, neurovascularly intact in all extremities with 2+ DTRs and 2+ pulses.  Lymph: No lymphadenopathy of posterior or anterior cervical chain or axillae bilaterally.  Gait normal with good balance and coordination.  MSK:  Non tender with full range of motion and good stability and symmetric strength and tone of shoulders, elbows, wrist, hip, knee and ankles bilaterally.  Neck: Inspection mild increase in lordosis. No palpable stepoffs. Negative Spurling's maneuver. Mild limited range of motion with left-sided rotation. Grip strength and sensation normal in bilateral hands Strength good C4 to T1 distribution No sensory change to C4 to T1 Negative Hoffman sign bilaterally Reflexes normal Patient does have retraction of the shoulders bilaterally  Osteopathic findings C2 flexed rotated and side bent right C6 flexed rotated and side bent right  T3 extended rotated and side bent right L4 flexed rotated and side bent right Sacrum left on right     Impression and Recommendations:     This case required medical decision making of moderate complexity.      Note: This dictation was prepared with Dragon dictation along with smaller phrase technology. Any transcriptional errors that result from this process are unintentional.

## 2018-01-20 ENCOUNTER — Encounter: Payer: Self-pay | Admitting: Family Medicine

## 2018-01-20 ENCOUNTER — Other Ambulatory Visit: Payer: Self-pay | Admitting: Family Medicine

## 2018-01-20 ENCOUNTER — Ambulatory Visit: Payer: BLUE CROSS/BLUE SHIELD | Admitting: Family Medicine

## 2018-01-20 ENCOUNTER — Ambulatory Visit (INDEPENDENT_AMBULATORY_CARE_PROVIDER_SITE_OTHER)
Admission: RE | Admit: 2018-01-20 | Discharge: 2018-01-20 | Disposition: A | Discharge status: Home / Self Care | Payer: BLUE CROSS/BLUE SHIELD | Source: Ambulatory Visit | Attending: Family Medicine | Admitting: Family Medicine

## 2018-01-20 VITALS — BP 138/82 | HR 102 | Ht 62.0 in | Wt 257.0 lb

## 2018-01-20 DIAGNOSIS — M542 Cervicalgia: Secondary | ICD-10-CM

## 2018-01-20 DIAGNOSIS — M999 Biomechanical lesion, unspecified: Secondary | ICD-10-CM

## 2018-01-20 DIAGNOSIS — M47812 Spondylosis without myelopathy or radiculopathy, cervical region: Secondary | ICD-10-CM | POA: Diagnosis not present

## 2018-01-20 MED ORDER — ALPRAZOLAM 0.5 MG PO TABS: ORAL_TABLET | ORAL | 0 refills | Status: DC

## 2018-01-20 MED ORDER — VORTIOXETINE HBR 10 MG PO TABS: 10.0000 mg | ORAL_TABLET | Freq: Every day | ORAL | 0 refills | Status: DC

## 2018-01-20 MED ORDER — VITAMIN D (ERGOCALCIFEROL) 1.25 MG (50000 UNIT) PO CAPS: 50000.0000 [IU] | ORAL_CAPSULE | ORAL | 0 refills | Status: DC

## 2018-01-20 MED ORDER — FENOFIBRATE 160 MG PO TABS: 160.0000 mg | ORAL_TABLET | Freq: Every day | ORAL | 0 refills | Status: DC

## 2018-01-20 NOTE — Assessment & Plan Note (Signed)
Decision today to treat with OMT was based on Physical Exam  After verbal consent patient was treated with HVLA, ME, FPR techniques in cervical, thoracic, lumbar and sacral areas  Patient tolerated the procedure well with improvement in symptoms  Patient given exercises, stretches and lifestyle modifications  See medications in patient instructions if given  Patient will follow up in 4 weeks 

## 2018-01-20 NOTE — Assessment & Plan Note (Addendum)
Neck pain secondary to muscular imbalances and poor posture. Discussed with activities of doing which wants to avoid.  Discussed posture and ergonomics.  Discussed topical anti-inflammatories.  Follow-up again in 4 weeks

## 2018-01-20 NOTE — Patient Instructions (Signed)
Good to see you.  Ice 20 minutes 2 times daily. Usually after activity and before bed. Keep hands within peripheral vision  Xrays downstairs We will try to get you an adjustable standing desk at work.  Vitamin D 4000 iU daily  Turmeric 500mg  twice daily  Tart cherry extract 750-1200mg  at night See me again in 3-5 weeks

## 2018-01-20 NOTE — Telephone Encounter (Signed)
Last OV trintellix last filled 01/01/18 Alprazolam last filled 10/10/17 #90 with 0

## 2018-01-30 ENCOUNTER — Encounter: Payer: Self-pay | Admitting: Family Medicine

## 2018-01-30 DIAGNOSIS — B370 Candidal stomatitis: Secondary | ICD-10-CM | POA: Diagnosis not present

## 2018-01-30 DIAGNOSIS — M1990 Unspecified osteoarthritis, unspecified site: Secondary | ICD-10-CM | POA: Diagnosis not present

## 2018-01-30 DIAGNOSIS — I10 Essential (primary) hypertension: Secondary | ICD-10-CM | POA: Diagnosis not present

## 2018-01-30 DIAGNOSIS — J302 Other seasonal allergic rhinitis: Secondary | ICD-10-CM | POA: Diagnosis not present

## 2018-02-09 ENCOUNTER — Encounter: Payer: Self-pay | Admitting: Family Medicine

## 2018-02-10 ENCOUNTER — Other Ambulatory Visit: Payer: Self-pay | Admitting: General Practice

## 2018-02-23 ENCOUNTER — Inpatient Hospital Stay: Payer: BLUE CROSS/BLUE SHIELD | Attending: Family | Admitting: Family

## 2018-02-23 ENCOUNTER — Other Ambulatory Visit: Payer: Self-pay | Admitting: Family

## 2018-02-23 ENCOUNTER — Inpatient Hospital Stay: Payer: BLUE CROSS/BLUE SHIELD

## 2018-02-23 VITALS — BP 141/76 | HR 97 | Temp 99.0°F | Resp 18 | Wt 262.2 lb

## 2018-02-23 DIAGNOSIS — D259 Leiomyoma of uterus, unspecified: Secondary | ICD-10-CM | POA: Diagnosis not present

## 2018-02-23 DIAGNOSIS — Z720 Tobacco use: Secondary | ICD-10-CM | POA: Insufficient documentation

## 2018-02-23 DIAGNOSIS — D72823 Leukemoid reaction: Secondary | ICD-10-CM

## 2018-02-23 DIAGNOSIS — D72829 Elevated white blood cell count, unspecified: Secondary | ICD-10-CM | POA: Diagnosis not present

## 2018-02-23 LAB — CBC WITH DIFFERENTIAL (CANCER CENTER ONLY)
BASOS PCT: 0 %
Basophils Absolute: 0 10*3/uL (ref 0.0–0.1)
EOS ABS: 0.2 10*3/uL (ref 0.0–0.5)
Eosinophils Relative: 1 %
HCT: 44.4 % (ref 34.8–46.6)
Hemoglobin: 15 g/dL (ref 11.6–15.9)
Lymphocytes Relative: 26 %
Lymphs Abs: 4.1 10*3/uL — ABNORMAL HIGH (ref 0.9–3.3)
MCH: 31.4 pg (ref 26.0–34.0)
MCHC: 33.8 g/dL (ref 32.0–36.0)
MCV: 92.9 fL (ref 81.0–101.0)
METAMYELOCYTES PCT: 1 %
MONO ABS: 0.9 10*3/uL (ref 0.1–0.9)
MONOS PCT: 6 %
Neutro Abs: 10.4 10*3/uL — ABNORMAL HIGH (ref 1.5–6.5)
Neutrophils Relative %: 66 %
PLATELETS: 277 10*3/uL (ref 145–400)
RBC: 4.78 MIL/uL (ref 3.70–5.32)
RDW: 12.8 % (ref 11.1–15.7)
WBC Count: 15.6 10*3/uL — ABNORMAL HIGH (ref 3.9–10.0)

## 2018-02-23 NOTE — Progress Notes (Signed)
Hematology and Oncology Follow Up Visit  Jenna Pope [id number] 11/19/1970 47 y.o. 02/23/2018   Principle Diagnosis:  Leukocytosis - leukemoid reaction  Current Therapy:   Observation   Interim History: Jenna Pope is here today for follow-up. She is doing well but still having heavy, irregular cycles due to uterine fibroids. She states that gynecology wants her to have a hysterectomy but she is trying to lose some weight first.  She has had no other episodes of bleeding, no bruising or petechiae.  She is still smoking. WBC count is 15.6.  She denies fatigue. She has had no issue with infections.  No lymphadenopathy noted on exam.  No fever, chills, n/v, cough, rash, dizziness, SOB, chest pain, palpitations, abdominal pain or changes in bowel or bladder habits.  No swelling, tenderness, numbness or tingling in her extremities. No c/o pain.  She has a good appetite and is staying well hydrated. Her weight is stable.   ECOG Performance Status: 0 - Asymptomatic  Medications:  Allergies as of 02/23/2018      Reactions   Ace Inhibitors    Cough + feels like she has a lump in her throat.       Medication List        Accurate as of 02/23/18  3:26 PM. Always use your most recent med list.          albuterol 108 (90 Base) MCG/ACT inhaler Commonly known as:  PROVENTIL HFA;VENTOLIN HFA Inhale 2 puffs into the lungs every 6 (six) hours as needed for wheezing or shortness of breath.   ALPRAZolam 0.5 MG tablet Commonly known as:  XANAX TAKE 1 TABLET BY MOUTH TWICE A DAY AS NEEDED FOR ANXIETY   amoxicillin 875 MG tablet Commonly known as:  AMOXIL Take 1 tablet (875 mg total) by mouth 2 (two) times daily.   cholecalciferol 1000 units tablet Commonly known as:  VITAMIN D Take 5,000 Units by mouth daily.   fenofibrate 160 MG tablet Take 1 tablet (160 mg total) by mouth daily.   hydrochlorothiazide 12.5 MG tablet Commonly known as:  HYDRODIURIL Take 1 tablet (12.5 mg total) by  mouth daily.   ibuprofen 200 MG tablet Commonly known as:  ADVIL,MOTRIN Take 400 mg by mouth every 6 (six) hours as needed for moderate pain.   loratadine 10 MG tablet Commonly known as:  CLARITIN Take 1 tablet (10 mg total) by mouth daily.   losartan 100 MG tablet Commonly known as:  COZAAR Take 1 tablet (100 mg total) by mouth daily.   meclizine 25 MG tablet Commonly known as:  ANTIVERT Take 1 tablet (25 mg total) 3 (three) times daily as needed by mouth for dizziness.   multivitamin tablet Take 1 tablet by mouth daily.   Phentermine HCl 8 MG Tabs Commonly known as:  LOMAIRA Take 1 tablet by mouth 3 (three) times daily before meals. 30 minutes prior to eating   Vitamin D (Ergocalciferol) 50000 units Caps capsule Commonly known as:  DRISDOL Take 1 capsule (50,000 Units total) by mouth every 7 (seven) days.   vortioxetine HBr 10 MG Tabs tablet Commonly known as:  TRINTELLIX Take 1 tablet (10 mg total) by mouth daily.       Allergies:  Allergies  Allergen Reactions  . Ace Inhibitors     Cough + feels like she has a lump in her throat.     Past Medical History, Surgical history, Social history, and Family History were reviewed and updated.  Review of Systems:  All other 10 point review of systems is negative.   Physical Exam:  vitals were not taken for this visit.   Wt Readings from Last 3 Encounters:  01/20/18 257 lb (116.6 kg)  01/01/18 261 lb 3.2 oz (118.5 kg)  10/10/17 281 lb 6 oz (127.6 kg)    Ocular: Sclerae unicteric, pupils equal, round and reactive to light Ear-nose-throat: Oropharynx clear, dentition fair Lymphatic: No cervical, supraclavicular or axillary adenopathy Lungs no rales or rhonchi, good excursion bilaterally Heart regular rate and rhythm, no murmur appreciated Abd soft, nontender, positive bowel sounds, no liver or spleen tip palpated on exam, no fluid wave  MSK no focal spinal tenderness, no joint edema Neuro: non-focal,  well-oriented, appropriate affect Breasts: Deferred   Lab Results  Component Value Date   WBC 15.6 (H) 02/23/2018   HGB 15.0 02/23/2018   HCT 44.4 02/23/2018   MCV 92.9 02/23/2018   PLT 277 02/23/2018   No results found for: FERRITIN, IRON, TIBC, UIBC, IRONPCTSAT Lab Results  Component Value Date   RBC 4.78 02/23/2018   No results found for: KPAFRELGTCHN, LAMBDASER, KAPLAMBRATIO No results found for: IGGSERUM, IGA, IGMSERUM No results found for: Ronnald Ramp, A1GS, A2GS, Tillman Sers, SPEI   Chemistry      Component Value Date/Time   NA 139 06/30/2017 1536   K 4.3 06/30/2017 1536   CL 106 06/30/2017 1536   CO2 24 06/30/2017 1536   BUN 10 06/30/2017 1536   CREATININE 0.59 06/30/2017 1536      Component Value Date/Time   CALCIUM 9.4 06/30/2017 1536   ALKPHOS 51 10/10/2017 1038   AST 22 10/10/2017 1038   ALT 27 10/10/2017 1038   BILITOT 0.7 10/10/2017 1038      Impression and Plan: Jenna Pope is a very pleasant 47 yo caucasian female with reactive leukocytosis. She is doing well and WBC count is stable at 15.6, no anemia, platelets 277.  We will continue to follow along with her and see her back in another 6 months.  She will contact our office with any questions or concerns. We can certainly see her sooner if need be.   Laverna Peace, NP 7/15/20193:26 PM

## 2018-02-24 LAB — LACTATE DEHYDROGENASE: LDH: 225 U/L — AB (ref 98–192)

## 2018-03-11 ENCOUNTER — Encounter: Payer: Self-pay | Admitting: Family Medicine

## 2018-04-23 ENCOUNTER — Encounter: Payer: Self-pay | Admitting: Family Medicine

## 2018-08-10 DIAGNOSIS — M1990 Unspecified osteoarthritis, unspecified site: Secondary | ICD-10-CM | POA: Diagnosis not present

## 2018-08-10 DIAGNOSIS — J302 Other seasonal allergic rhinitis: Secondary | ICD-10-CM | POA: Diagnosis not present

## 2018-08-10 DIAGNOSIS — I10 Essential (primary) hypertension: Secondary | ICD-10-CM | POA: Diagnosis not present

## 2018-08-21 ENCOUNTER — Inpatient Hospital Stay: Payer: BLUE CROSS/BLUE SHIELD

## 2018-08-21 ENCOUNTER — Other Ambulatory Visit: Payer: Self-pay | Admitting: Family

## 2018-08-21 ENCOUNTER — Other Ambulatory Visit: Payer: Self-pay

## 2018-08-21 ENCOUNTER — Encounter: Payer: Self-pay | Admitting: Family

## 2018-08-21 ENCOUNTER — Inpatient Hospital Stay: Payer: BLUE CROSS/BLUE SHIELD | Attending: Family | Admitting: Family

## 2018-08-21 VITALS — BP 127/84 | HR 99 | Temp 98.4°F | Resp 19

## 2018-08-21 DIAGNOSIS — D72823 Leukemoid reaction: Secondary | ICD-10-CM

## 2018-08-21 DIAGNOSIS — D72829 Elevated white blood cell count, unspecified: Secondary | ICD-10-CM | POA: Insufficient documentation

## 2018-08-21 DIAGNOSIS — Z720 Tobacco use: Secondary | ICD-10-CM | POA: Insufficient documentation

## 2018-08-21 DIAGNOSIS — M1990 Unspecified osteoarthritis, unspecified site: Secondary | ICD-10-CM

## 2018-08-21 LAB — CBC WITH DIFFERENTIAL (CANCER CENTER ONLY)
ABS IMMATURE GRANULOCYTES: 0.1 10*3/uL — AB (ref 0.00–0.07)
BASOS ABS: 0.1 10*3/uL (ref 0.0–0.1)
Basophils Relative: 0 %
EOS PCT: 1 %
Eosinophils Absolute: 0.1 10*3/uL (ref 0.0–0.5)
HEMATOCRIT: 44.8 % (ref 36.0–46.0)
HEMOGLOBIN: 15.1 g/dL — AB (ref 12.0–15.0)
IMMATURE GRANULOCYTES: 1 %
LYMPHS ABS: 3.6 10*3/uL (ref 0.7–4.0)
LYMPHS PCT: 32 %
MCH: 30.9 pg (ref 26.0–34.0)
MCHC: 33.7 g/dL (ref 30.0–36.0)
MCV: 91.6 fL (ref 80.0–100.0)
Monocytes Absolute: 0.8 10*3/uL (ref 0.1–1.0)
Monocytes Relative: 7 %
NEUTROS ABS: 6.6 10*3/uL (ref 1.7–7.7)
NRBC: 0 % (ref 0.0–0.2)
Neutrophils Relative %: 59 %
Platelet Count: 279 10*3/uL (ref 150–400)
RBC: 4.89 MIL/uL (ref 3.87–5.11)
RDW: 12.2 % (ref 11.5–15.5)
WBC Count: 11.3 10*3/uL — ABNORMAL HIGH (ref 4.0–10.5)

## 2018-08-21 LAB — SAVE SMEAR(SSMR), FOR PROVIDER SLIDE REVIEW

## 2018-08-21 NOTE — Progress Notes (Signed)
Hematology and Oncology Follow Up Visit  Jenna Pope [id number] 1971/01/25 48 y.o. 08/21/2018   Principle Diagnosis:  Leukocytosis - leukemoid reaction  Current Therapy:   Observation   Interim History:  Jenna Pope is here today with her husband for follow-up. She is doing well and has no complaints at this time.  Her WBC count is stable at 11.3, HGB 15.1 and platelets 279.  She is still smoking. Her cycle is irregular and heavy at times.  No fever, chills, n/v, cough, rash, dizziness, SOB, chest pain, palpitations, abdominal pain or changes in bowel or bladder habits.  No swelling, tenderness, numbness or tingling in her extremities.  No lymphadenopathy noted on exam.  She has maintained a good appetite and is eating healthier and smaller portions. She is hydrating well. Her weight is stable.   ECOG Performance Status: 1 - Symptomatic but completely ambulatory  Medications:  Allergies as of 08/21/2018      Reactions   Ace Inhibitors    Cough + feels like she has a lump in her throat.       Medication List       Accurate as of August 21, 2018  3:40 PM. Always use your most recent med list.        albuterol 108 (90 Base) MCG/ACT inhaler Commonly known as:  PROVENTIL HFA;VENTOLIN HFA Inhale 2 puffs into the lungs every 6 (six) hours as needed for wheezing or shortness of breath.   ALPRAZolam 0.5 MG tablet Commonly known as:  XANAX TAKE 1 TABLET BY MOUTH TWICE A DAY AS NEEDED FOR ANXIETY   amoxicillin 875 MG tablet Commonly known as:  AMOXIL Take 1 tablet (875 mg total) by mouth 2 (two) times daily.   cholecalciferol 1000 units tablet Commonly known as:  VITAMIN D Take 5,000 Units by mouth daily.   fenofibrate 160 MG tablet Take 1 tablet (160 mg total) by mouth daily.   hydrochlorothiazide 12.5 MG tablet Commonly known as:  HYDRODIURIL Take 1 tablet (12.5 mg total) by mouth daily.   ibuprofen 200 MG tablet Commonly known as:  ADVIL,MOTRIN Take 400 mg by mouth  every 6 (six) hours as needed for moderate pain.   loratadine 10 MG tablet Commonly known as:  CLARITIN Take 1 tablet (10 mg total) by mouth daily.   losartan 100 MG tablet Commonly known as:  COZAAR Take 1 tablet (100 mg total) by mouth daily.   meclizine 25 MG tablet Commonly known as:  ANTIVERT Take 1 tablet (25 mg total) 3 (three) times daily as needed by mouth for dizziness.   multivitamin tablet Take 1 tablet by mouth daily.   Vitamin D (Ergocalciferol) 1.25 MG (50000 UT) Caps capsule Commonly known as:  DRISDOL Take 1 capsule (50,000 Units total) by mouth every 7 (seven) days.   vortioxetine HBr 10 MG Tabs tablet Commonly known as:  TRINTELLIX Take 1 tablet (10 mg total) by mouth daily.       Allergies:  Allergies  Allergen Reactions  . Ace Inhibitors     Cough + feels like she has a lump in her throat.     Past Medical History, Surgical history, Social history, and Family History were reviewed and updated.  Review of Systems: All other 10 point review of systems is negative.   Physical Exam:  vitals were not taken for this visit.   Wt Readings from Last 3 Encounters:  02/23/18 262 lb 4 oz (119 kg)  01/20/18 257 lb (116.6 kg)  01/01/18  261 lb 3.2 oz (118.5 kg)    Ocular: Sclerae unicteric, pupils equal, round and reactive to light Ear-nose-throat: Oropharynx clear, dentition fair Lymphatic: No cervical, supraclavicular or axillary adenopathy Lungs no rales or rhonchi, good excursion bilaterally Heart regular rate and rhythm, no murmur appreciated Abd soft, nontender, positive bowel sounds, no liver or spleen tip palpated on exam, no fluid wave  MSK no focal spinal tenderness, no joint edema Neuro: non-focal, well-oriented, appropriate affect Breasts: Deferred   Lab Results  Component Value Date   WBC 11.3 (H) 08/21/2018   HGB 15.1 (H) 08/21/2018   HCT 44.8 08/21/2018   MCV 91.6 08/21/2018   PLT 279 08/21/2018   No results found for: FERRITIN,  IRON, TIBC, UIBC, IRONPCTSAT Lab Results  Component Value Date   RBC 4.89 08/21/2018   No results found for: KPAFRELGTCHN, LAMBDASER, KAPLAMBRATIO No results found for: IGGSERUM, IGA, IGMSERUM No results found for: Ronnald Ramp, A1GS, A2GS, Tillman Sers, SPEI   Chemistry      Component Value Date/Time   NA 139 06/30/2017 1536   K 4.3 06/30/2017 1536   CL 106 06/30/2017 1536   CO2 24 06/30/2017 1536   BUN 10 06/30/2017 1536   CREATININE 0.59 06/30/2017 1536      Component Value Date/Time   CALCIUM 9.4 06/30/2017 1536   ALKPHOS 51 10/10/2017 1038   AST 22 10/10/2017 1038   ALT 27 10/10/2017 1038   BILITOT 0.7 10/10/2017 1038       Impression and Plan: Jenna Pope is a very pleasant 48 yo caucasian female with reactive leukocytosis secondary to smoking and arthritis. She continues to do well and WBC count is stable at 11.3.  We will plan to see her in another 6 months.  She will contact our office with any questions or concerns. We can certainly see her sooner if need be.   Jenna Peace, NP 1/10/20203:40 PM

## 2018-08-24 LAB — LACTATE DEHYDROGENASE: LDH: 239 U/L — ABNORMAL HIGH (ref 98–192)

## 2018-08-27 ENCOUNTER — Encounter: Payer: Self-pay | Admitting: Family Medicine

## 2018-11-02 ENCOUNTER — Other Ambulatory Visit: Payer: Self-pay | Admitting: Physician Assistant

## 2018-11-03 NOTE — Telephone Encounter (Signed)
Last OV 01/01/18 Alprazolam last filled 01/20/18 #60 with 0 trintellix last filled 01/20/18 #90 with 0

## 2018-11-04 ENCOUNTER — Encounter: Payer: Self-pay | Admitting: *Deleted

## 2018-11-04 NOTE — Telephone Encounter (Signed)
Pt is well overdue for a visit and appears to have been out of these medications for quite some time.  Will need to set up video visit in order to get refills.

## 2018-11-04 NOTE — Telephone Encounter (Signed)
Mychart message has been sent letting patient know she has to schedule a video visit in order to receive medication refills.

## 2018-11-12 ENCOUNTER — Encounter: Payer: Self-pay | Admitting: Family Medicine

## 2018-11-12 ENCOUNTER — Ambulatory Visit (INDEPENDENT_AMBULATORY_CARE_PROVIDER_SITE_OTHER): Payer: BLUE CROSS/BLUE SHIELD | Admitting: Family Medicine

## 2018-11-12 ENCOUNTER — Other Ambulatory Visit: Payer: Self-pay

## 2018-11-12 VITALS — HR 94 | Temp 98.8°F | Ht 62.0 in | Wt 264.0 lb

## 2018-11-12 DIAGNOSIS — I10 Essential (primary) hypertension: Secondary | ICD-10-CM | POA: Diagnosis not present

## 2018-11-12 DIAGNOSIS — E559 Vitamin D deficiency, unspecified: Secondary | ICD-10-CM | POA: Diagnosis not present

## 2018-11-12 DIAGNOSIS — E781 Pure hyperglyceridemia: Secondary | ICD-10-CM | POA: Diagnosis not present

## 2018-11-12 DIAGNOSIS — F418 Other specified anxiety disorders: Secondary | ICD-10-CM

## 2018-11-12 DIAGNOSIS — R0789 Other chest pain: Secondary | ICD-10-CM

## 2018-11-12 MED ORDER — VORTIOXETINE HBR 10 MG PO TABS: 10.0000 mg | ORAL_TABLET | Freq: Every day | ORAL | 1 refills | Status: DC

## 2018-11-12 MED ORDER — FENOFIBRATE 160 MG PO TABS: 160.0000 mg | ORAL_TABLET | Freq: Every day | ORAL | 1 refills | Status: DC

## 2018-11-12 MED ORDER — ALPRAZOLAM 0.5 MG PO TABS: ORAL_TABLET | ORAL | 0 refills | Status: DC

## 2018-11-12 MED ORDER — ALBUTEROL SULFATE HFA 108 (90 BASE) MCG/ACT IN AERS: 2.0000 | INHALATION_SPRAY | Freq: Four times a day (QID) | RESPIRATORY_TRACT | 0 refills | Status: DC | PRN

## 2018-11-12 NOTE — Progress Notes (Signed)
I have discussed the procedure for the virtual visit with the patient who has given consent to proceed with assessment and treatment.   Orazio Weller, CMA     

## 2018-11-12 NOTE — Progress Notes (Signed)
Virtual Visit via Video   I connected with@ on 11/12/18 at  1:30 PM EDT by a video enabled telemedicine application and verified that I am speaking with the correct person using two identifiers. Location patient: Home Location provider: Acupuncturist, Office Persons participating in the virtual visit: Pt and myself  I discussed the limitations of evaluation and management by telemedicine and the availability of in person appointments. The patient expressed understanding and agreed to proceed.  Subjective:   HPI:  HTN- chronic problem, on HCTZ 12.5mg  daily, Losartan 100mg  daily.  Denies CP, SOB, HAs, visual changes, edema.  Home BPs 129-134/74-82  Hyperlipidemia- chronic problem, on Fenofibrate 160mg  daily.  No abd pain, N/V.  Depression- chronic problem, on Trintellix 10mg  daily and Alprazolam 0.5mg  PRN.  Pt is able to work from home and stay safe.  Mood is good currently.  Obesity- BMI is now 48.29.  No regular exercise, not following a particular diet.  ROS: See pertinent positives and negatives per HPI.  Patient Active Problem List   Diagnosis Date Noted  . Neck pain 01/20/2018  . Nonallopathic lesion of thoracic region 01/20/2018  . Nonallopathic lesion of cervical region 01/20/2018  . Nonallopathic lesion of lumbosacral region 01/20/2018  . Hypertriglyceridemia 10/10/2017  . Vertigo 06/30/2017  . Ganglion cyst 05/07/2017  . Vitamin D deficiency 12/26/2016  . Hypothyroidism 07/01/2016  . Physical exam 12/22/2015  . 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 Allergic rhinitis 10/31/2014  . Tobacco use disorder 09/06/2014  . Hypokalemia 04/08/2014  . 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 Pain in the abdomen 03/30/2014  . Unspecified constipation 03/30/2014  . LBP (low back pain) 11/18/2013  . Bronchitis with bronchospasm 08/27/2013  . Leg cramps 05/27/2013  . HTN (hypertension) 04/20/2013  . Morbid obesity ([place]) 04/20/2013  . Depression with anxiety 04/20/2013  . GERD  (gastroesophageal reflux disease) 04/20/2013  . OSA on CPAP 04/20/2013  . Eczema of hand 04/20/2013    Social History   Tobacco Use  . Smoking status: Current Every Day Smoker    Packs/day: 1.00    Years: 20.00    Pack years: 20.00  . Smokeless tobacco: Never Used  Substance Use Topics  . Alcohol use: Yes    Comment: rare    Current Outpatient Medications:  .  albuterol (PROVENTIL HFA;VENTOLIN HFA) 108 (90 Base) MCG/ACT inhaler, Inhale 2 puffs into the lungs every 6 (six) hours as needed for wheezing or shortness of breath., Disp: 1 Inhaler, Rfl: 0 .  ALPRAZolam (XANAX) 0.5 MG tablet, TAKE 1 TABLET BY MOUTH TWICE A DAY AS NEEDED FOR ANXIETY, Disp: 60 tablet, Rfl: 0 .  cholecalciferol (VITAMIN D) 1000 units tablet, Take 5,000 Units by mouth daily., Disp: , Rfl:  .  fenofibrate 160 MG tablet, Take 1 tablet (160 mg total) by mouth daily., Disp: 90 tablet, Rfl: 0 .  hydrochlorothiazide (HYDRODIURIL) 12.5 MG tablet, Take 1 tablet (12.5 mg total) by mouth daily., Disp: 90 tablet, Rfl: 3 .  ibuprofen (ADVIL,MOTRIN) 200 MG tablet, Take 400 mg by mouth every 6 (six) hours as needed for moderate pain., Disp: , Rfl:  .  loratadine (CLARITIN) 10 MG tablet, Take 1 tablet (10 mg total) by mouth daily., Disp: 30 tablet, Rfl: 0 .  losartan (COZAAR) 100 MG tablet, Take 1 tablet (100 mg total) by mouth daily., Disp: 90 tablet, Rfl: 1 .  meclizine (ANTIVERT) 25 MG tablet, Take 1 tablet (25 mg total) 3 (three) times daily as needed by mouth for dizziness.,  Disp: 30 tablet, Rfl: 0 .  Multiple Vitamin (MULTIVITAMIN) tablet, Take 1 tablet by mouth daily., Disp: , Rfl:  .  Vitamin D, Ergocalciferol, (DRISDOL) 50000 units CAPS capsule, Take 1 capsule (50,000 Units total) by mouth every 7 (seven) days., Disp: 12 capsule, Rfl: 0 .  vortioxetine HBr (TRINTELLIX) 10 MG TABS tablet, Take 1 tablet (10 mg total) by mouth daily., Disp: 90 tablet, Rfl: 0 .  amoxicillin (AMOXIL) 875 MG tablet, Take 1 tablet (875 mg  total) by mouth 2 (two) times daily., Disp: 20 tablet, Rfl: 0  Allergies  Allergen Reactions  . Ace Inhibitors     Cough + feels like she has a lump in her throat.     Objective:   Pulse 94   Temp 98.8 F (37.1 C)   Ht 5\' 2"  (1.575 m)   Wt 264 lb (119.7 kg)   SpO2 98%   BMI 48.29 kg/m  AAOx3, NAD NCAT, EOMI obese No obvious CN deficits Coloring WNL Pt is able to speak clearly, coherently without shortness of breath or increased work of breathing.  Thought process is linear.  Mood is appropriate.   Assessment and Plan:   Obesity- ongoing issue for pt.  Stressed need for healthy diet and regular exercise.  Will check labs to risk stratify.  HTN- chronic problem.  Home BPs are good per pt report.  Will check BP when she comes for labs.  Currently asymptomatic.  No anticipated med changes  Hyperlipidemia- chronic problem.  Currently on fenofibrate.  Asymptomatic.  Again stressed need for healthy diet and regular exercise.  Depression- currently well controlled.  Refill provided for Trintellix and Alprazolam.  Will continue to follow.   Annye Asa, MD 11/12/2018

## 2018-11-18 ENCOUNTER — Other Ambulatory Visit: Payer: Self-pay

## 2018-11-18 ENCOUNTER — Other Ambulatory Visit (INDEPENDENT_AMBULATORY_CARE_PROVIDER_SITE_OTHER): Payer: BLUE CROSS/BLUE SHIELD

## 2018-11-18 DIAGNOSIS — R7989 Other specified abnormal findings of blood chemistry: Secondary | ICD-10-CM

## 2018-11-18 DIAGNOSIS — E781 Pure hyperglyceridemia: Secondary | ICD-10-CM

## 2018-11-18 DIAGNOSIS — E559 Vitamin D deficiency, unspecified: Secondary | ICD-10-CM | POA: Diagnosis not present

## 2018-11-18 DIAGNOSIS — I10 Essential (primary) hypertension: Secondary | ICD-10-CM | POA: Diagnosis not present

## 2018-11-18 LAB — BASIC METABOLIC PANEL
BUN: 9 mg/dL (ref 6–23)
CO2: 26 mEq/L (ref 19–32)
Calcium: 9 mg/dL (ref 8.4–10.5)
Chloride: 105 mEq/L (ref 96–112)
Creatinine, Ser: 0.7 mg/dL (ref 0.40–1.20)
GFR: 89.48 mL/min (ref >60.00)
Glucose, Bld: 84 mg/dL (ref 70–99)
Potassium: 4.2 mEq/L (ref 3.5–5.1)
Sodium: 139 mEq/L (ref 135–145)

## 2018-11-18 LAB — CBC WITH DIFFERENTIAL/PLATELET
Basophils Absolute: 0 10*3/uL (ref 0.0–0.1)
Basophils Relative: 0.4 % (ref 0.0–3.0)
Eosinophils Absolute: 0.1 10*3/uL (ref 0.0–0.7)
Eosinophils Relative: 1.2 % (ref 0.0–5.0)
HCT: 42.3 % (ref 36.0–46.0)
Hemoglobin: 14.6 g/dL (ref 12.0–15.0)
Lymphocytes Relative: 27.4 % (ref 12.0–46.0)
Lymphs Abs: 3.1 10*3/uL (ref 0.7–4.0)
MCHC: 34.4 g/dL (ref 30.0–36.0)
MCV: 91 fl (ref 78.0–100.0)
Monocytes Absolute: 0.5 10*3/uL (ref 0.1–1.0)
Monocytes Relative: 4.5 % (ref 3.0–12.0)
Neutro Abs: 7.6 10*3/uL (ref 1.4–7.7)
Neutrophils Relative %: 66.5 % (ref 43.0–77.0)
Platelets: 244 10*3/uL (ref 150.0–400.0)
RBC: 4.65 Mil/uL (ref 3.87–5.11)
RDW: 13 % (ref 11.5–15.5)
WBC: 11.5 10*3/uL — ABNORMAL HIGH (ref 4.0–10.5)

## 2018-11-18 LAB — HEPATIC FUNCTION PANEL
ALT: 19 U/L (ref 0–35)
AST: 20 U/L (ref 0–37)
Albumin: 3.9 g/dL (ref 3.5–5.2)
Alkaline Phosphatase: 62 U/L (ref 39–117)
Bilirubin, Direct: 0.1 mg/dL (ref 0.0–0.3)
Total Bilirubin: 0.6 mg/dL (ref 0.2–1.2)
Total Protein: 6.1 g/dL (ref 6.0–8.3)

## 2018-11-18 LAB — LIPID PANEL
Cholesterol: 162 mg/dL (ref 0–200)
HDL: 26.8 mg/dL — ABNORMAL LOW (ref >39.00)
LDL Cholesterol: 107 mg/dL — ABNORMAL HIGH (ref 0–99)
NonHDL: 135.36
Total CHOL/HDL Ratio: 6
Triglycerides: 142 mg/dL (ref 0.0–149.0)
VLDL: 28.4 mg/dL (ref 0.0–40.0)

## 2018-11-18 LAB — TSH: TSH: 5.85 u[IU]/mL — ABNORMAL HIGH (ref 0.35–4.50)

## 2018-11-18 LAB — VITAMIN D 25 HYDROXY (VIT D DEFICIENCY, FRACTURES): VITD: 19.43 ng/mL — ABNORMAL LOW (ref 30.00–100.00)

## 2018-11-18 MED ORDER — LEVOTHYROXINE SODIUM 50 MCG PO TABS: 50.0000 ug | ORAL_TABLET | Freq: Every day | ORAL | 3 refills | Status: DC

## 2018-11-18 MED ORDER — VITAMIN D (ERGOCALCIFEROL) 1.25 MG (50000 UNIT) PO CAPS: 50000.0000 [IU] | ORAL_CAPSULE | ORAL | 0 refills | Status: DC

## 2018-12-21 ENCOUNTER — Other Ambulatory Visit (INDEPENDENT_AMBULATORY_CARE_PROVIDER_SITE_OTHER): Payer: BLUE CROSS/BLUE SHIELD

## 2018-12-21 DIAGNOSIS — R7989 Other specified abnormal findings of blood chemistry: Secondary | ICD-10-CM

## 2018-12-21 LAB — TSH: TSH: 1.39 u[IU]/mL (ref 0.35–4.50)

## 2018-12-22 ENCOUNTER — Encounter: Payer: Self-pay | Admitting: General Practice

## 2019-01-15 ENCOUNTER — Ambulatory Visit (INDEPENDENT_AMBULATORY_CARE_PROVIDER_SITE_OTHER): Payer: BC Managed Care – PPO | Admitting: Family Medicine

## 2019-01-15 ENCOUNTER — Encounter: Payer: Self-pay | Admitting: Family Medicine

## 2019-01-15 ENCOUNTER — Other Ambulatory Visit: Payer: Self-pay

## 2019-01-15 VITALS — BP 124/82 | HR 98 | Temp 98.0°F | Resp 17 | Ht 61.5 in | Wt 261.1 lb

## 2019-01-15 DIAGNOSIS — E559 Vitamin D deficiency, unspecified: Secondary | ICD-10-CM

## 2019-01-15 DIAGNOSIS — Z0000 Encounter for general adult medical examination without abnormal findings: Secondary | ICD-10-CM

## 2019-01-15 LAB — CBC WITH DIFFERENTIAL/PLATELET
Basophils Absolute: 0 10*3/uL (ref 0.0–0.1)
Basophils Relative: 0.4 % (ref 0.0–3.0)
Eosinophils Absolute: 0.1 10*3/uL (ref 0.0–0.7)
Eosinophils Relative: 1.2 % (ref 0.0–5.0)
HCT: 46.8 % — ABNORMAL HIGH (ref 36.0–46.0)
Hemoglobin: 16 g/dL — ABNORMAL HIGH (ref 12.0–15.0)
Lymphocytes Relative: 33.9 % (ref 12.0–46.0)
Lymphs Abs: 3.2 10*3/uL (ref 0.7–4.0)
MCHC: 34.3 g/dL (ref 30.0–36.0)
MCV: 90.5 fl (ref 78.0–100.0)
Monocytes Absolute: 0.5 10*3/uL (ref 0.1–1.0)
Monocytes Relative: 5.7 % (ref 3.0–12.0)
Neutro Abs: 5.5 10*3/uL (ref 1.4–7.7)
Neutrophils Relative %: 58.8 % (ref 43.0–77.0)
Platelets: 252 10*3/uL (ref 150.0–400.0)
RBC: 5.17 Mil/uL — ABNORMAL HIGH (ref 3.87–5.11)
RDW: 13.4 % (ref 11.5–15.5)
WBC: 9.4 10*3/uL (ref 4.0–10.5)

## 2019-01-15 LAB — HEPATIC FUNCTION PANEL
ALT: 26 U/L (ref 0–35)
AST: 27 U/L (ref 0–37)
Albumin: 4.1 g/dL (ref 3.5–5.2)
Alkaline Phosphatase: 71 U/L (ref 39–117)
Bilirubin, Direct: 0.1 mg/dL (ref 0.0–0.3)
Total Bilirubin: 0.6 mg/dL (ref 0.2–1.2)
Total Protein: 6.6 g/dL (ref 6.0–8.3)

## 2019-01-15 LAB — BASIC METABOLIC PANEL
BUN: 8 mg/dL (ref 6–23)
CO2: 27 mEq/L (ref 19–32)
Calcium: 9.5 mg/dL (ref 8.4–10.5)
Chloride: 103 mEq/L (ref 96–112)
Creatinine, Ser: 0.68 mg/dL (ref 0.40–1.20)
GFR: 92.46 mL/min (ref >60.00)
Glucose, Bld: 92 mg/dL (ref 70–99)
Potassium: 4 mEq/L (ref 3.5–5.1)
Sodium: 139 mEq/L (ref 135–145)

## 2019-01-15 LAB — LIPID PANEL
Cholesterol: 178 mg/dL (ref 0–200)
HDL: 32.2 mg/dL — ABNORMAL LOW (ref >39.00)
LDL Cholesterol: 117 mg/dL — ABNORMAL HIGH (ref 0–99)
NonHDL: 145.37
Total CHOL/HDL Ratio: 6
Triglycerides: 143 mg/dL (ref 0.0–149.0)
VLDL: 28.6 mg/dL (ref 0.0–40.0)

## 2019-01-15 LAB — VITAMIN D 25 HYDROXY (VIT D DEFICIENCY, FRACTURES): VITD: 29.46 ng/mL — ABNORMAL LOW (ref 30.00–100.00)

## 2019-01-15 LAB — TSH: TSH: 1.59 u[IU]/mL (ref 0.35–4.50)

## 2019-01-15 NOTE — Progress Notes (Signed)
   Subjective:    Patient ID: Jenna Pope, female    DOB: 01-06-1971, 48 y.o.   MRN: [id number]  HPI CPE- UTD on pap, due for mammo.  Plans to schedule w/ GYN.  UTD on immunizations.  Pt is down 20 lbs since last visit.  Pt reports decreased appetite since adjusting thyroid medication.   Review of Systems Patient reports no vision/ hearing changes, adenopathy,fever, weight change,  persistant/recurrent hoarseness , swallowing issues, chest pain, palpitations, edema, persistant/recurrent cough, hemoptysis, dyspnea (rest/exertional/paroxysmal nocturnal), gastrointestinal bleeding (melena, rectal bleeding), abdominal pain, significant heartburn, bowel changes, GU symptoms (dysuria, hematuria, incontinence), Gyn symptoms (abnormal  bleeding, pain),  syncope, focal weakness, memory loss, numbness & tingling, skin/hair/nail changes, abnormal bruising or bleeding, anxiety, or depression.     Objective:   Physical Exam General Appearance:    Alert, cooperative, no distress, appears stated age, obese  Head:    Normocephalic, without obvious abnormality, atraumatic  Eyes:    PERRL, conjunctiva/corneas clear, EOM's intact, fundi    benign, both eyes  Ears:    Normal TM's and external ear canals, both ears  Nose:   Nares normal, septum midline, mucosa normal, no drainage    or sinus tenderness  Throat:   Lips, mucosa, and tongue normal; teeth and gums normal  Neck:   Supple, symmetrical, trachea midline, no adenopathy;    Thyroid: no enlargement/tenderness/nodules  Back:     Symmetric, no curvature, ROM normal, no CVA tenderness  Lungs:     Clear to auscultation bilaterally, respirations unlabored  Chest Wall:    No tenderness or deformity   Heart:    Regular rate and rhythm, S1 and S2 normal, no murmur, rub   or gallop  Breast Exam:    Deferred to GYN  Abdomen:     Soft, non-tender, bowel sounds active all four quadrants,    no masses, no organomegaly  Genitalia:    Deferred to GYN  Rectal:     Extremities:   Extremities normal, atraumatic, no cyanosis or edema  Pulses:   2+ and symmetric all extremities  Skin:   Skin color, texture, turgor normal, no rashes or lesions  Lymph nodes:   Cervical, supraclavicular, and axillary nodes normal  Neurologic:   CNII-XII intact, normal strength, sensation and reflexes    throughout          Assessment & Plan:

## 2019-01-15 NOTE — Assessment & Plan Note (Signed)
Pt has lost 20lbs since last visit.  Encouraged her to continue to work on healthy diet and regular exercise.  Check labs to risk stratify.  Will follow.

## 2019-01-15 NOTE — Patient Instructions (Addendum)
Follow up in 6 months to recheck BP and cholesterol We'll notify you of your lab results and make any changes if needed Call and schedule with GYN for pap and mammo Continue to work on healthy diet and regular exercise- you can do it!!! Call with any questions or concerns Stay Safe!!!

## 2019-01-15 NOTE — Assessment & Plan Note (Signed)
Pt has hx of this.  Check labs and replete prn. 

## 2019-01-15 NOTE — Assessment & Plan Note (Signed)
Pt's PE WNL w/ exception of obesity.  Due for GYN- pt to schedule.  UTD on immunizations.  Check labs.  Anticipatory guidance provided.

## 2019-01-19 ENCOUNTER — Encounter: Payer: Self-pay | Admitting: General Practice

## 2019-02-16 DIAGNOSIS — E039 Hypothyroidism, unspecified: Secondary | ICD-10-CM | POA: Diagnosis not present

## 2019-02-16 DIAGNOSIS — G44209 Tension-type headache, unspecified, not intractable: Secondary | ICD-10-CM | POA: Diagnosis not present

## 2019-02-16 DIAGNOSIS — I10 Essential (primary) hypertension: Secondary | ICD-10-CM | POA: Diagnosis not present

## 2019-02-16 DIAGNOSIS — J302 Other seasonal allergic rhinitis: Secondary | ICD-10-CM | POA: Diagnosis not present

## 2019-02-17 ENCOUNTER — Other Ambulatory Visit: Payer: Self-pay | Admitting: Family Medicine

## 2019-02-17 DIAGNOSIS — R0789 Other chest pain: Secondary | ICD-10-CM

## 2019-02-17 MED ORDER — ALBUTEROL SULFATE HFA 108 (90 BASE) MCG/ACT IN AERS: 2.0000 | INHALATION_SPRAY | Freq: Four times a day (QID) | RESPIRATORY_TRACT | 3 refills | Status: DC | PRN

## 2019-02-17 MED ORDER — ALPRAZOLAM 0.5 MG PO TABS: ORAL_TABLET | ORAL | 0 refills | Status: DC

## 2019-02-17 NOTE — Telephone Encounter (Signed)
Last OV 01/15/19 Alprazolam last filled 11/12/18 #60 with 0

## 2019-02-19 ENCOUNTER — Inpatient Hospital Stay: Payer: BC Managed Care – PPO

## 2019-02-19 ENCOUNTER — Other Ambulatory Visit: Payer: Self-pay

## 2019-02-19 ENCOUNTER — Inpatient Hospital Stay: Payer: BC Managed Care – PPO | Attending: Family | Admitting: Family

## 2019-02-19 ENCOUNTER — Encounter: Payer: Self-pay | Admitting: Family

## 2019-02-19 VITALS — BP 163/110 | HR 113 | Temp 97.1°F | Resp 19 | Ht 61.75 in | Wt 267.0 lb

## 2019-02-19 DIAGNOSIS — D72828 Other elevated white blood cell count: Secondary | ICD-10-CM | POA: Insufficient documentation

## 2019-02-19 DIAGNOSIS — M1990 Unspecified osteoarthritis, unspecified site: Secondary | ICD-10-CM | POA: Diagnosis not present

## 2019-02-19 DIAGNOSIS — D72829 Elevated white blood cell count, unspecified: Secondary | ICD-10-CM

## 2019-02-19 DIAGNOSIS — D72823 Leukemoid reaction: Secondary | ICD-10-CM

## 2019-02-19 DIAGNOSIS — R030 Elevated blood-pressure reading, without diagnosis of hypertension: Secondary | ICD-10-CM | POA: Diagnosis not present

## 2019-02-19 DIAGNOSIS — Z720 Tobacco use: Secondary | ICD-10-CM | POA: Diagnosis not present

## 2019-02-19 LAB — CBC WITH DIFFERENTIAL (CANCER CENTER ONLY)
Abs Immature Granulocytes: 0.2 10*3/uL — ABNORMAL HIGH (ref 0.00–0.07)
Basophils Absolute: 0.1 10*3/uL (ref 0.0–0.1)
Basophils Relative: 0 %
Eosinophils Absolute: 0.2 10*3/uL (ref 0.0–0.5)
Eosinophils Relative: 1 %
HCT: 45.7 % (ref 36.0–46.0)
Hemoglobin: 15.5 g/dL — ABNORMAL HIGH (ref 12.0–15.0)
Immature Granulocytes: 1 %
Lymphocytes Relative: 27 %
Lymphs Abs: 4.1 10*3/uL — ABNORMAL HIGH (ref 0.7–4.0)
MCH: 30.6 pg (ref 26.0–34.0)
MCHC: 33.9 g/dL (ref 30.0–36.0)
MCV: 90.3 fL (ref 80.0–100.0)
Monocytes Absolute: 0.8 10*3/uL (ref 0.1–1.0)
Monocytes Relative: 5 %
Neutro Abs: 9.7 10*3/uL — ABNORMAL HIGH (ref 1.7–7.7)
Neutrophils Relative %: 66 %
Platelet Count: 271 10*3/uL (ref 150–400)
RBC: 5.06 MIL/uL (ref 3.87–5.11)
RDW: 12.9 % (ref 11.5–15.5)
WBC Count: 14.9 10*3/uL — ABNORMAL HIGH (ref 4.0–10.5)
nRBC: 0 % (ref 0.0–0.2)

## 2019-02-19 LAB — SAVE SMEAR(SSMR), FOR PROVIDER SLIDE REVIEW

## 2019-02-19 NOTE — Progress Notes (Signed)
Hematology and Oncology Follow Up Visit  Jenna Pope [id number] 05-27-1971 48 y.o. 02/19/2019   Principle Diagnosis:  Leukocytosis - leukemoid reaction  Current Therapy:   Observation   Interim History:  [name] is here today for follow-up. She is doing well and has no complaints at this time.  She states that she gets very nervous when she comes to see [place] and her BP is elevated.  No issue with infections. No fever, chills, n/v, cough, rash, dizziness, SOB, chest pain, palpitations, abdominal pain or changes in bowel or bladder habits.  She is still smoking.  No swelling, tenderness, numbness or tingling in her extremities.  No lymphadenopathy noted on exam.  She has maintained a good appetite and is staying well hydrated. Her weight is stable.   ECOG Performance Status: 1 - Symptomatic but completely ambulatory  Medications:  Allergies as of 02/19/2019      Reactions   Ace Inhibitors    Cough + feels like she has a lump in her throat.       Medication List       Accurate as of February 19, 2019  3:27 PM. If you have any questions, ask your nurse or doctor.        albuterol 108 (90 Base) MCG/ACT inhaler Commonly known as: VENTOLIN HFA Inhale 2 puffs into the lungs every 6 (six) hours as needed for wheezing or shortness of breath.   ALPRAZolam 0.5 MG tablet Commonly known as: XANAX TAKE 1 TABLET BY MOUTH TWICE A DAY AS NEEDED FOR ANXIETY   cholecalciferol 1000 units tablet Commonly known as: VITAMIN D Take 5,000 Units by mouth daily.   fenofibrate 160 MG tablet Take 1 tablet (160 mg total) by mouth daily.   hydrochlorothiazide 12.5 MG tablet Commonly known as: HYDRODIURIL Take 1 tablet (12.5 mg total) by mouth daily.   ibuprofen 200 MG tablet Commonly known as: ADVIL Take 400 mg by mouth every 6 (six) hours as needed for moderate pain.   levothyroxine 50 MCG tablet Commonly known as: SYNTHROID Take 1 tablet (50 mcg total) by mouth daily.   loratadine 10 MG  tablet Commonly known as: Claritin Take 1 tablet (10 mg total) by mouth daily.   losartan 100 MG tablet Commonly known as: COZAAR Take 1 tablet (100 mg total) by mouth daily.   meclizine 25 MG tablet Commonly known as: ANTIVERT Take 1 tablet (25 mg total) 3 (three) times daily as needed by mouth for dizziness.   multivitamin tablet Take 1 tablet by mouth daily.   vortioxetine HBr 10 MG Tabs tablet Commonly known as: Trintellix Take 1 tablet (10 mg total) by mouth daily.       Allergies:  Allergies  Allergen Reactions  . Ace Inhibitors     Cough + feels like she has a lump in her throat.     Past Medical History, Surgical history, Social history, and Family History were reviewed and updated.  Review of Systems: All other 10 point review of systems is negative.   Physical Exam:  vitals were not taken for this visit.   Wt Readings from Last 3 Encounters:  01/15/19 261 lb 2 oz (118.4 kg)  11/12/18 264 lb (119.7 kg)  02/23/18 262 lb 4 oz (119 kg)    Ocular: Sclerae unicteric, pupils equal, round and reactive to light Ear-nose-throat: Oropharynx clear, dentition fair Lymphatic: No cervical or supraclavicular adenopathy Lungs no rales or rhonchi, good excursion bilaterally Heart regular rate and rhythm, no murmur appreciated  Abd soft, nontender, positive bowel sounds, no liver or spleen tip palpated on exam, no fluid wave  MSK no focal spinal tenderness, no joint edema Neuro: non-focal, well-oriented, appropriate affect Breasts: Deferred   Lab Results  Component Value Date   WBC 9.4 01/15/2019   HGB 16.0 (H) 01/15/2019   HCT 46.8 (H) 01/15/2019   MCV 90.5 01/15/2019   PLT 252.0 01/15/2019   No results found for: FERRITIN, IRON, TIBC, UIBC, IRONPCTSAT Lab Results  Component Value Date   RBC 5.17 (H) 01/15/2019   No results found for: KPAFRELGTCHN, LAMBDASER, KAPLAMBRATIO No results found for: IGGSERUM, IGA, IGMSERUM No results found for: Kathrynn Ducking, MSPIKE, SPEI   Chemistry      Component Value Date/Time   NA 139 01/15/2019 1359   K 4.0 01/15/2019 1359   CL 103 01/15/2019 1359   CO2 27 01/15/2019 1359   BUN 8 01/15/2019 1359   CREATININE 0.68 01/15/2019 1359   CREATININE 0.59 06/30/2017 1536      Component Value Date/Time   CALCIUM 9.5 01/15/2019 1359   ALKPHOS 71 01/15/2019 1359   AST 27 01/15/2019 1359   ALT 26 01/15/2019 1359   BILITOT 0.6 01/15/2019 1359       Impression and Plan: [name] is a very pleasant 48 yo caucasian female with reactive leukocytosis secondary to smoking and arthritis. She continues to do well and has no complaints.  Her WBC count is 14.9, Hgb 15.5 and platelets 271.  We will go ahead and plan to see her back in another year.  She will contact our office with any questions or concerns. We can certainly see her sooner if needed.   Laverna Peace, NP 7/10/20203:27 PM

## 2019-02-22 LAB — LACTATE DEHYDROGENASE: LDH: 250 U/L — ABNORMAL HIGH (ref 98–192)

## 2019-05-18 ENCOUNTER — Other Ambulatory Visit: Payer: Self-pay | Admitting: Family Medicine

## 2019-06-03 ENCOUNTER — Other Ambulatory Visit: Payer: Self-pay | Admitting: Family Medicine

## 2019-06-03 MED ORDER — ALPRAZOLAM 0.5 MG PO TABS: ORAL_TABLET | ORAL | 0 refills | Status: DC

## 2019-06-03 NOTE — Telephone Encounter (Signed)
Last OV 01/15/19 Alprazolam last filled 02/17/19 #60 with 0

## 2019-06-04 ENCOUNTER — Encounter: Payer: Self-pay | Admitting: Family Medicine

## 2019-06-04 DIAGNOSIS — G4733 Obstructive sleep apnea (adult) (pediatric): Secondary | ICD-10-CM

## 2019-06-04 DIAGNOSIS — Z1231 Encounter for screening mammogram for malignant neoplasm of breast: Secondary | ICD-10-CM

## 2019-06-04 LAB — HM PAP SMEAR

## 2019-06-17 DIAGNOSIS — Z01419 Encounter for gynecological examination (general) (routine) without abnormal findings: Secondary | ICD-10-CM | POA: Diagnosis not present

## 2019-06-17 DIAGNOSIS — N926 Irregular menstruation, unspecified: Secondary | ICD-10-CM | POA: Diagnosis not present

## 2019-06-17 DIAGNOSIS — Z6841 Body Mass Index (BMI) 40.0 and over, adult: Secondary | ICD-10-CM | POA: Diagnosis not present

## 2019-06-17 DIAGNOSIS — B373 Candidiasis of vulva and vagina: Secondary | ICD-10-CM | POA: Diagnosis not present

## 2019-06-18 DIAGNOSIS — E039 Hypothyroidism, unspecified: Secondary | ICD-10-CM | POA: Diagnosis not present

## 2019-06-18 DIAGNOSIS — G44209 Tension-type headache, unspecified, not intractable: Secondary | ICD-10-CM | POA: Diagnosis not present

## 2019-06-18 DIAGNOSIS — I10 Essential (primary) hypertension: Secondary | ICD-10-CM | POA: Diagnosis not present

## 2019-06-18 DIAGNOSIS — J302 Other seasonal allergic rhinitis: Secondary | ICD-10-CM | POA: Diagnosis not present

## 2019-06-24 ENCOUNTER — Ambulatory Visit: Payer: BC Managed Care – PPO | Admitting: Pulmonary Disease

## 2019-06-24 ENCOUNTER — Encounter: Payer: Self-pay | Admitting: Pulmonary Disease

## 2019-06-24 ENCOUNTER — Ambulatory Visit (INDEPENDENT_AMBULATORY_CARE_PROVIDER_SITE_OTHER): Payer: BC Managed Care – PPO

## 2019-06-24 ENCOUNTER — Other Ambulatory Visit: Payer: Self-pay

## 2019-06-24 VITALS — BP 132/82 | HR 107 | Temp 97.8°F | Ht 61.0 in | Wt 269.4 lb

## 2019-06-24 DIAGNOSIS — R0602 Shortness of breath: Secondary | ICD-10-CM

## 2019-06-24 DIAGNOSIS — Z23 Encounter for immunization: Secondary | ICD-10-CM | POA: Diagnosis not present

## 2019-06-24 NOTE — Progress Notes (Signed)
Subjective:    Patient ID: Jenna Pope, female    DOB: 1971/07/07, 47 y.o.   MRN: [id number]  Patient with a history of obstructive sleep apnea  Uses CPAP regularly Controlled symptoms Has no significant daytime sleepiness  Usually goes to bed between 11 and 12 Takes about 30 minutes to fall asleep 1-2 awakenings Final wake up time between 730 and 8  Denies significant shortness of breath  Active smoker    Past Medical History:  Diagnosis Date  . Anxiety   . Arthritis    knees   . Carpal tunnel syndrome   . Family history of anesthesia complication    had an aunt cardiac arrest during a knee surgery  . GERD (gastroesophageal reflux disease)   . Hypertension   . Hypothyroidism   . Shortness of breath    with exertion   . Sleep apnea    uses cpap since 2/13  setting at 12    Family History  Problem Relation Age of Onset  . Arthritis Mother   . Alcohol abuse Mother   . Hyperlipidemia Father   . Heart disease Father   . Stroke Father   . Hypertension Father   . Cancer Son   . Arthritis Maternal Aunt   . Cancer Maternal Uncle   . Alcohol abuse Maternal Grandfather    Social History   Socioeconomic History  . Marital status: Married    Spouse name: Not on file  . Number of children: Not on file  . Years of education: Not on file  . Highest education level: Not on file  Occupational History  . Not on file  Social Needs  . Financial resource strain: Not on file  . Food insecurity    Worry: Not on file    Inability: Not on file  . Transportation needs    Medical: Not on file    Non-medical: Not on file  Tobacco Use  . Smoking status: Current Every Day Smoker    Packs/day: 1.00    Years: 20.00    Pack years: 20.00  . Smokeless tobacco: Never Used  Substance and Sexual Activity  . Alcohol use: Yes    Comment: rare  . Drug use: No  . Sexual activity: Not on file  Lifestyle  . Physical activity    Days per week: Not on file    Minutes per session:  Not on file  . Stress: Not on file  Relationships  . Social Herbalist on phone: Not on file    Gets together: Not on file    Attends religious service: Not on file    Active member of club or organization: Not on file    Attends meetings of clubs or organizations: Not on file    Relationship status: Not on file  . Intimate partner violence    Fear of current or ex partner: Not on file    Emotionally abused: Not on file    Physically abused: Not on file    Forced sexual activity: Not on file  Other Topics Concern  . Not on file  Social History Narrative  . Not on file      Review of Systems  Constitutional: Negative for fatigue.  HENT: Negative.   Respiratory: Negative.   Musculoskeletal: Positive for arthralgias.  Psychiatric/Behavioral: The patient is nervous/anxious.   All other systems reviewed and are negative.      Objective:   Physical Exam Constitutional:  Appearance: She is obese.  HENT:     Head: Normocephalic.     Nose: Nose normal. No congestion.  Eyes:     Pupils: Pupils are equal, round, and reactive to light.  Neck:     Musculoskeletal: Normal range of motion. No neck rigidity or muscular tenderness.  Cardiovascular:     Rate and Rhythm: Normal rate.     Pulses: Normal pulses.     Heart sounds: No murmur.  Pulmonary:     Effort: Pulmonary effort is normal. No respiratory distress.     Breath sounds: Normal breath sounds. No stridor. No wheezing or rhonchi.  Musculoskeletal: Normal range of motion.        General: No swelling or tenderness.  Skin:    General: Skin is warm.     Coloration: Skin is not jaundiced.  Neurological:     General: No focal deficit present.     Mental Status: She is alert.     Cranial Nerves: No cranial nerve deficit.  Psychiatric:        Mood and Affect: Mood normal.    Vitals:   06/24/19 1546  BP: 132/82  Pulse: (!) 107  Temp: 97.8 F (36.6 C)  SpO2: 98%   Results of the Epworth flowsheet  06/24/2019  Sitting and reading 2  Watching TV 0  Sitting, inactive in a public place (e.g. a theatre or a meeting) 0  As a passenger in a car for an hour without a break 2  Lying down to rest in the afternoon when circumstances permit 2  Sitting and talking to someone 0  Sitting quietly after a lunch without alcohol 0  In a car, while stopped for a few minutes in traffic 0  Total score 6      Assessment & Plan:  .  Patient with a history of obstructive sleep apnea -Machine is probably about 48 years old but states it is still working well -Unable to get a download -No significant daytime sleepiness -Sleeps well, wakes up like she is at a good nights rest  .  Hypertension -Well-controlled  .  Superobesity -Continues to work on weight loss efforts  .  Active smoker -Smoking cessation counseling  Plan: Continue CPAP use  We will consider a home study if needed to qualify for new CPAP  We will obtain a PFT and chest x-ray  Smoking cessation counseling, risk of continuing to smoke discussed  I will see her back in the office in about 6 months

## 2019-06-24 NOTE — Patient Instructions (Signed)
Obstructive sleep apnea  Appears well-controlled at present  Continue CPAP use  Call with any concerns  I will see you again in about 6 months

## 2019-07-09 ENCOUNTER — Other Ambulatory Visit (HOSPITAL_COMMUNITY)
Admission: RE | Admit: 2019-07-09 | Discharge: 2019-07-09 | Disposition: A | Discharge status: Home / Self Care | Payer: BC Managed Care – PPO | Source: Ambulatory Visit | Attending: Pulmonary Disease | Admitting: Pulmonary Disease

## 2019-07-09 DIAGNOSIS — Z01812 Encounter for preprocedural laboratory examination: Secondary | ICD-10-CM | POA: Diagnosis not present

## 2019-07-09 DIAGNOSIS — Z20828 Contact with and (suspected) exposure to other viral communicable diseases: Secondary | ICD-10-CM | POA: Insufficient documentation

## 2019-07-09 LAB — SARS CORONAVIRUS 2 (TAT 6-24 HRS): SARS Coronavirus 2: NEGATIVE

## 2019-07-12 ENCOUNTER — Ambulatory Visit (INDEPENDENT_AMBULATORY_CARE_PROVIDER_SITE_OTHER): Payer: BC Managed Care – PPO | Admitting: Pulmonary Disease

## 2019-07-12 ENCOUNTER — Other Ambulatory Visit: Payer: Self-pay

## 2019-07-12 DIAGNOSIS — R0602 Shortness of breath: Secondary | ICD-10-CM

## 2019-07-12 LAB — PULMONARY FUNCTION TEST
DL/VA % pred: 89 %
DL/VA: 3.92 ml/min/mmHg/L
DLCO unc % pred: 96 %
DLCO unc: 19.07 ml/min/mmHg
FEF 25-75 Post: 2.07 L/sec
FEF 25-75 Pre: 1.79 L/sec
FEF2575-%Change-Post: 15 %
FEF2575-%Pred-Post: 76 %
FEF2575-%Pred-Pre: 65 %
FEV1-%Change-Post: 3 %
FEV1-%Pred-Post: 80 %
FEV1-%Pred-Pre: 78 %
FEV1-Post: 2.15 L
FEV1-Pre: 2.08 L
FEV1FVC-%Change-Post: 3 %
FEV1FVC-%Pred-Pre: 95 %
FEV6-%Change-Post: 0 %
FEV6-%Pred-Post: 82 %
FEV6-%Pred-Pre: 81 %
FEV6-Post: 2.68 L
FEV6-Pre: 2.66 L
FEV6FVC-%Change-Post: 0 %
FEV6FVC-%Pred-Post: 102 %
FEV6FVC-%Pred-Pre: 101 %
FVC-%Change-Post: 0 %
FVC-%Pred-Post: 80 %
FVC-%Pred-Pre: 80 %
FVC-Post: 2.68 L
FVC-Pre: 2.69 L
Post FEV1/FVC ratio: 80 %
Post FEV6/FVC ratio: 100 %
Pre FEV1/FVC ratio: 77 %
Pre FEV6/FVC Ratio: 99 %
RV % pred: 116 %
RV: 1.92 L
TLC % pred: 112 %
TLC: 5.36 L

## 2019-07-12 NOTE — Progress Notes (Signed)
Full PFT performed today. °

## 2019-07-16 ENCOUNTER — Encounter: Payer: Self-pay | Admitting: Family Medicine

## 2019-07-16 ENCOUNTER — Ambulatory Visit (INDEPENDENT_AMBULATORY_CARE_PROVIDER_SITE_OTHER): Payer: BC Managed Care – PPO | Admitting: Family Medicine

## 2019-07-16 ENCOUNTER — Other Ambulatory Visit: Payer: Self-pay

## 2019-07-16 VITALS — HR 87

## 2019-07-16 DIAGNOSIS — E781 Pure hyperglyceridemia: Secondary | ICD-10-CM | POA: Diagnosis not present

## 2019-07-16 DIAGNOSIS — I10 Essential (primary) hypertension: Secondary | ICD-10-CM | POA: Diagnosis not present

## 2019-07-16 DIAGNOSIS — E039 Hypothyroidism, unspecified: Secondary | ICD-10-CM | POA: Diagnosis not present

## 2019-07-16 NOTE — Progress Notes (Signed)
Virtual Visit via Video   I connected with patient on 07/16/19 at  9:30 AM EST by a video enabled telemedicine application and verified that I am speaking with the correct person using two identifiers.  Location patient: Home Location provider: Acupuncturist, Office Persons participating in the virtual visit: Patient, Provider, Benedict (Jess B)  I discussed the limitations of evaluation and management by telemedicine and the availability of in person appointments. The patient expressed understanding and agreed to proceed.  Subjective:   HPI:   HTN- chronic problem, on HCTZ 12.5mg  daily and Losartan 100mg  daily.  No CP, SOB, HAs, visual changes, edema.  Hyperlipidemia- chronic problem, on Fenofibrate 160mg  daily.  Started [employer]- is down 9.5 lbs.  Denies abd pain, N/V.  Hypothyroid- chronic problem, on Levothyroxine 78mcg daily.  Denies excessive fatigue or changes to skin/hair/nails.  ROS:   See pertinent positives and negatives per HPI.  Patient Active Problem List   Diagnosis Date Noted  . Neck pain 01/20/2018  . Nonallopathic lesion of thoracic region 01/20/2018  . Nonallopathic lesion of cervical region 01/20/2018  . Nonallopathic lesion of lumbosacral region 01/20/2018  . Hypertriglyceridemia 10/10/2017  . Vertigo 06/30/2017  . Ganglion cyst 05/07/2017  . Vitamin D deficiency 12/26/2016  . Physical exam 12/22/2015  . 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 Allergic rhinitis 10/31/2014  . Tobacco use disorder 09/06/2014  . Hypokalemia 04/08/2014  . 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 LBP (low back pain) 11/18/2013  . HTN (hypertension) 04/20/2013  . Morbid obesity ([place]) 04/20/2013  . Depression with anxiety 04/20/2013  . OSA on CPAP 04/20/2013  . Eczema of hand 04/20/2013    Social History   Tobacco Use  . Smoking status: Current Every Day Smoker    Packs/day: 1.00    Years: 20.00    Pack years: 20.00  . Smokeless tobacco: Never Used  Substance Use Topics  .  Alcohol use: Yes    Comment: rare    Current Outpatient Medications:  .  albuterol (VENTOLIN HFA) 108 (90 Base) MCG/ACT inhaler, Inhale 2 puffs into the lungs every 6 (six) hours as needed for wheezing or shortness of breath., Disp: 18 g, Rfl: 3 .  ALPRAZolam (XANAX) 0.5 MG tablet, TAKE 1 TABLET BY MOUTH TWICE A DAY AS NEEDED FOR ANXIETY, Disp: 60 tablet, Rfl: 0 .  cholecalciferol (VITAMIN D) 1000 units tablet, Take 5,000 Units by mouth daily., Disp: , Rfl:  .  fenofibrate 160 MG tablet, TAKE ONE TABLET BY MOUTH ONE TIME DAILY , Disp: 90 tablet, Rfl: 0 .  hydrochlorothiazide (HYDRODIURIL) 12.5 MG tablet, Take 1 tablet (12.5 mg total) by mouth daily., Disp: 90 tablet, Rfl: 3 .  ibuprofen (ADVIL,MOTRIN) 200 MG tablet, Take 400 mg by mouth every 6 (six) hours as needed for moderate pain., Disp: , Rfl:  .  levothyroxine (SYNTHROID, LEVOTHROID) 50 MCG tablet, Take 1 tablet (50 mcg total) by mouth daily., Disp: 90 tablet, Rfl: 3 .  loratadine (CLARITIN) 10 MG tablet, Take 1 tablet (10 mg total) by mouth daily., Disp: 30 tablet, Rfl: 0 .  losartan (COZAAR) 100 MG tablet, Take 1 tablet (100 mg total) by mouth daily., Disp: 90 tablet, Rfl: 1 .  meclizine (ANTIVERT) 25 MG tablet, Take 1 tablet (25 mg total) 3 (three) times daily as needed by mouth for dizziness., Disp: 30 tablet, Rfl: 0 .  Multiple Vitamin (MULTIVITAMIN) tablet, Take 1 tablet by mouth daily., Disp: , Rfl:  .  TRINTELLIX 10 MG TABS tablet, TAKE  ONE TABLET BY MOUTH ONE TIME DAILY , Disp: 90 tablet, Rfl: 0  Allergies  Allergen Reactions  . Ace Inhibitors Cough    Cough + feels like she has a lump in her throat.     Objective:   Pulse 87   LMP 06/17/2019   SpO2 99%  AAOx3, NAD NCAT, EOMI Obese No obvious CN deficits Coloring WNL Pt is able to speak clearly, coherently without shortness of breath or increased work of breathing.  Thought process is linear.  Mood is appropriate.   Assessment and Plan:   HTN- chronic problem.   No way to check BP today but pt is working on losing weight which should improve readings.  Check labs.  Will follow.  Hypertriglyceridemia- chronic problem.  Doing well on Fenofibrate.  Has joined [employer].  Is interested in coming off Fenofibrate if labs allow.  Hypothyroid- currently asymptomatic.  Check labs.  Adjust meds prn    Annye Asa, MD 07/16/2019

## 2019-07-16 NOTE — Progress Notes (Signed)
I have discussed the procedure for the virtual visit with the patient who has given consent to proceed with assessment and treatment.   [name], CMA     

## 2019-07-20 ENCOUNTER — Other Ambulatory Visit: Payer: Self-pay

## 2019-07-20 ENCOUNTER — Ambulatory Visit (INDEPENDENT_AMBULATORY_CARE_PROVIDER_SITE_OTHER): Payer: BC Managed Care – PPO

## 2019-07-20 DIAGNOSIS — E781 Pure hyperglyceridemia: Secondary | ICD-10-CM

## 2019-07-20 DIAGNOSIS — E039 Hypothyroidism, unspecified: Secondary | ICD-10-CM | POA: Diagnosis not present

## 2019-07-20 DIAGNOSIS — I10 Essential (primary) hypertension: Secondary | ICD-10-CM

## 2019-07-20 LAB — CBC WITH DIFFERENTIAL/PLATELET
Basophils Absolute: 0 10*3/uL (ref 0.0–0.1)
Basophils Relative: 0.4 % (ref 0.0–3.0)
Eosinophils Absolute: 0.1 10*3/uL (ref 0.0–0.7)
Eosinophils Relative: 1.1 % (ref 0.0–5.0)
HCT: 44.9 % (ref 36.0–46.0)
Hemoglobin: 15.3 g/dL — ABNORMAL HIGH (ref 12.0–15.0)
Lymphocytes Relative: 32.7 % (ref 12.0–46.0)
Lymphs Abs: 3.3 10*3/uL (ref 0.7–4.0)
MCHC: 34 g/dL (ref 30.0–36.0)
MCV: 91.7 fl (ref 78.0–100.0)
Monocytes Absolute: 0.4 10*3/uL (ref 0.1–1.0)
Monocytes Relative: 4.4 % (ref 3.0–12.0)
Neutro Abs: 6.1 10*3/uL (ref 1.4–7.7)
Neutrophils Relative %: 61.4 % (ref 43.0–77.0)
Platelets: 250 10*3/uL (ref 150.0–400.0)
RBC: 4.9 Mil/uL (ref 3.87–5.11)
RDW: 12.9 % (ref 11.5–15.5)
WBC: 10 10*3/uL (ref 4.0–10.5)

## 2019-07-20 LAB — HEPATIC FUNCTION PANEL
ALT: 24 U/L (ref 0–35)
AST: 26 U/L (ref 0–37)
Albumin: 4.1 g/dL (ref 3.5–5.2)
Alkaline Phosphatase: 69 U/L (ref 39–117)
Bilirubin, Direct: 0.1 mg/dL (ref 0.0–0.3)
Total Bilirubin: 0.6 mg/dL (ref 0.2–1.2)
Total Protein: 6.4 g/dL (ref 6.0–8.3)

## 2019-07-20 LAB — BASIC METABOLIC PANEL
BUN: 6 mg/dL (ref 6–23)
CO2: 28 mEq/L (ref 19–32)
Calcium: 9.4 mg/dL (ref 8.4–10.5)
Chloride: 105 mEq/L (ref 96–112)
Creatinine, Ser: 0.69 mg/dL (ref 0.40–1.20)
GFR: 90.72 mL/min (ref >60.00)
Glucose, Bld: 126 mg/dL — ABNORMAL HIGH (ref 70–99)
Potassium: 3.9 mEq/L (ref 3.5–5.1)
Sodium: 139 mEq/L (ref 135–145)

## 2019-07-20 LAB — LIPID PANEL
Cholesterol: 170 mg/dL (ref 0–200)
HDL: 29.7 mg/dL — ABNORMAL LOW (ref >39.00)
LDL Cholesterol: 109 mg/dL — ABNORMAL HIGH (ref 0–99)
NonHDL: 139.91
Total CHOL/HDL Ratio: 6
Triglycerides: 154 mg/dL — ABNORMAL HIGH (ref 0.0–149.0)
VLDL: 30.8 mg/dL (ref 0.0–40.0)

## 2019-07-20 LAB — TSH: TSH: 2.24 u[IU]/mL (ref 0.35–4.50)

## 2019-07-22 ENCOUNTER — Other Ambulatory Visit (INDEPENDENT_AMBULATORY_CARE_PROVIDER_SITE_OTHER): Payer: BC Managed Care – PPO

## 2019-07-22 ENCOUNTER — Encounter: Payer: Self-pay | Admitting: Family Medicine

## 2019-07-22 DIAGNOSIS — R7309 Other abnormal glucose: Secondary | ICD-10-CM

## 2019-07-22 LAB — HEMOGLOBIN A1C: Hgb A1c MFr Bld: 5.4 % (ref 4.6–6.5)

## 2019-09-02 ENCOUNTER — Encounter: Payer: Self-pay | Admitting: Family Medicine

## 2019-10-21 ENCOUNTER — Other Ambulatory Visit: Payer: Self-pay | Admitting: Family Medicine

## 2019-10-21 MED ORDER — ALPRAZOLAM 0.5 MG PO TABS: ORAL_TABLET | ORAL | 0 refills | Status: DC

## 2019-10-21 NOTE — Telephone Encounter (Signed)
Last OV 07/16/19 Alprazolam last filled 06/03/19 #60 with 0

## 2019-11-29 DIAGNOSIS — H40013 Open angle with borderline findings, low risk, bilateral: Secondary | ICD-10-CM | POA: Diagnosis not present

## 2019-12-13 ENCOUNTER — Encounter: Payer: Self-pay | Admitting: Family Medicine

## 2019-12-13 DIAGNOSIS — E282 Polycystic ovarian syndrome: Secondary | ICD-10-CM

## 2019-12-13 DIAGNOSIS — E039 Hypothyroidism, unspecified: Secondary | ICD-10-CM

## 2020-01-21 ENCOUNTER — Encounter: Payer: BC Managed Care – PPO | Admitting: Family Medicine

## 2020-02-07 ENCOUNTER — Encounter: Payer: Self-pay | Admitting: Family Medicine

## 2020-02-18 ENCOUNTER — Inpatient Hospital Stay: Payer: BC Managed Care – PPO

## 2020-02-18 ENCOUNTER — Inpatient Hospital Stay: Payer: BC Managed Care – PPO | Attending: Family | Admitting: Family

## 2020-02-18 ENCOUNTER — Other Ambulatory Visit: Payer: Self-pay

## 2020-02-18 ENCOUNTER — Encounter: Payer: Self-pay | Admitting: Family

## 2020-02-18 VITALS — BP 153/100 | HR 91 | Temp 98.2°F | Resp 18 | Ht 61.0 in | Wt 261.1 lb

## 2020-02-18 DIAGNOSIS — M1990 Unspecified osteoarthritis, unspecified site: Secondary | ICD-10-CM | POA: Insufficient documentation

## 2020-02-18 DIAGNOSIS — D72829 Elevated white blood cell count, unspecified: Secondary | ICD-10-CM

## 2020-02-18 DIAGNOSIS — D72823 Leukemoid reaction: Secondary | ICD-10-CM | POA: Diagnosis not present

## 2020-02-18 DIAGNOSIS — Z720 Tobacco use: Secondary | ICD-10-CM | POA: Diagnosis not present

## 2020-02-18 LAB — CMP (CANCER CENTER ONLY)
ALT: 21 U/L (ref 0–44)
AST: 24 U/L (ref 15–41)
Albumin: 3.8 g/dL (ref 3.5–5.0)
Alkaline Phosphatase: 68 U/L (ref 38–126)
Anion gap: 13 (ref 5–15)
BUN: 7 mg/dL (ref 6–20)
CO2: 24 mmol/L (ref 22–32)
Calcium: 8.9 mg/dL (ref 8.9–10.3)
Chloride: 103 mmol/L (ref 98–111)
Creatinine: 0.67 mg/dL (ref 0.44–1.00)
GFR, Est AFR Am: >60 mL/min
GFR, Estimated: >60 mL/min
Glucose, Bld: 90 mg/dL (ref 70–99)
Potassium: 3.7 mmol/L (ref 3.5–5.1)
Sodium: 140 mmol/L (ref 135–145)
Total Bilirubin: 0.5 mg/dL (ref 0.3–1.2)
Total Protein: 7.3 g/dL (ref 6.5–8.1)

## 2020-02-18 LAB — CBC WITH DIFFERENTIAL (CANCER CENTER ONLY)
Abs Immature Granulocytes: 0.15 10*3/uL — ABNORMAL HIGH (ref 0.00–0.07)
Basophils Absolute: 0.1 10*3/uL (ref 0.0–0.1)
Basophils Relative: 1 %
Eosinophils Absolute: 0.1 10*3/uL (ref 0.0–0.5)
Eosinophils Relative: 1 %
HCT: 46.1 % — ABNORMAL HIGH (ref 36.0–46.0)
Hemoglobin: 15.6 g/dL — ABNORMAL HIGH (ref 12.0–15.0)
Immature Granulocytes: 1 %
Lymphocytes Relative: 31 %
Lymphs Abs: 3.4 10*3/uL (ref 0.7–4.0)
MCH: 30.9 pg (ref 26.0–34.0)
MCHC: 33.8 g/dL (ref 30.0–36.0)
MCV: 91.3 fL (ref 80.0–100.0)
Monocytes Absolute: 0.7 10*3/uL (ref 0.1–1.0)
Monocytes Relative: 6 %
Neutro Abs: 6.6 10*3/uL (ref 1.7–7.7)
Neutrophils Relative %: 60 %
Platelet Count: 238 10*3/uL (ref 150–400)
RBC: 5.05 MIL/uL (ref 3.87–5.11)
RDW: 12.6 % (ref 11.5–15.5)
WBC Count: 11 10*3/uL — ABNORMAL HIGH (ref 4.0–10.5)
nRBC: 0 % (ref 0.0–0.2)

## 2020-02-18 LAB — SAVE SMEAR(SSMR), FOR PROVIDER SLIDE REVIEW

## 2020-02-18 NOTE — Progress Notes (Signed)
Hematology and Oncology Follow Up Visit  Jenna Pope [id number] 11/05/1970 49 y.o. 02/18/2020   Principle Diagnosis:  Leukocytosis - leukemoid reaction  Current Therapy:        Observation   Interim History:  Jenna Pope is here today for follow-up. She is doing fairly well but still having issues with her arthritic pain in her knees and shoulders as well as tenderness over both ankle bones due to ganglion cysts. She feels that this is exacerbated by the extra weight and she is doing her best to try and lose.  Her WBC count is stable at 11.0, Hgb 15.6 and platelets 238.  She is still smoking 1 ppd.  She states that she is starting to go through the change of life per her gynecologist. Her cycle is irregular with irregular flow. No fever, chills, n/v, cough, rash, dizziness, SOB, chest pain, palpitations, abdominal pain or changes in bowel or bladder habits.  No swelling, tenderness, numbness or tingling in her extremities at this time.  No falls or syncope.  She has maintained a healthy appetite and is staying well hydrated. Her weight is stable.   ECOG Performance Status: 1 - Symptomatic but completely ambulatory  Medications:  Allergies as of 02/18/2020      Reactions   Ace Inhibitors Cough   Cough + feels like she has a lump in her throat.       Medication List       Accurate as of February 18, 2020  4:25 PM. If you have any questions, ask your nurse or doctor.        albuterol 108 (90 Base) MCG/ACT inhaler Commonly known as: VENTOLIN HFA Inhale 2 puffs into the lungs every 6 (six) hours as needed for wheezing or shortness of breath.   ALPRAZolam 0.5 MG tablet Commonly known as: XANAX TAKE 1 TABLET BY MOUTH TWICE A DAY AS NEEDED FOR ANXIETY   cholecalciferol 1000 units tablet Commonly known as: VITAMIN D Take 5,000 Units by mouth daily.   fenofibrate 160 MG tablet TAKE ONE TABLET BY MOUTH ONE TIME DAILY   hydrochlorothiazide 12.5 MG tablet Commonly known as:  HYDRODIURIL Take 1 tablet (12.5 mg total) by mouth daily.   ibuprofen 200 MG tablet Commonly known as: ADVIL Take 400 mg by mouth every 6 (six) hours as needed for moderate pain.   levothyroxine 50 MCG tablet Commonly known as: SYNTHROID Take 1 tablet (50 mcg total) by mouth daily.   loratadine 10 MG tablet Commonly known as: Claritin Take 1 tablet (10 mg total) by mouth daily.   losartan 100 MG tablet Commonly known as: COZAAR Take 1 tablet (100 mg total) by mouth daily.   meclizine 25 MG tablet Commonly known as: ANTIVERT Take 1 tablet (25 mg total) 3 (three) times daily as needed by mouth for dizziness.   multivitamin tablet Take 1 tablet by mouth daily.   Trintellix 10 MG Tabs tablet Generic drug: vortioxetine HBr TAKE ONE TABLET BY MOUTH ONE TIME DAILY       Allergies:  Allergies  Allergen Reactions  . Ace Inhibitors Cough    Cough + feels like she has a lump in her throat.     Past Medical History, Surgical history, Social history, and Family History were reviewed and updated.  Review of Systems: All other 10 point review of systems is negative.   Physical Exam:  height is 5\' 1"  (1.549 m) and weight is 261 lb 1.9 oz (118.4 kg). Her oral temperature is  98.2 F (36.8 C). Her blood pressure is 153/100 (abnormal) and her pulse is 91. Her respiration is 18 and oxygen saturation is 100%.   Wt Readings from Last 3 Encounters:  02/18/20 261 lb 1.9 oz (118.4 kg)  06/24/19 269 lb 6.4 oz (122.2 kg)  02/19/19 267 lb (121.1 kg)    Ocular: Sclerae unicteric, pupils equal, round and reactive to light Ear-nose-throat: Oropharynx clear, dentition fair Lymphatic: No cervical or supraclavicular adenopathy Lungs no rales or rhonchi, good excursion bilaterally Heart regular rate and rhythm, no murmur appreciated Abd soft, nontender, positive bowel sounds, no liver or spleen tip palpated on exam, no fluid wave  MSK no focal spinal tenderness, no joint edema Neuro:  non-focal, well-oriented, appropriate affect Breasts: Deferred   Lab Results  Component Value Date   WBC 11.0 (H) 02/18/2020   HGB 15.6 (H) 02/18/2020   HCT 46.1 (H) 02/18/2020   MCV 91.3 02/18/2020   PLT 238 02/18/2020   No results found for: FERRITIN, IRON, TIBC, UIBC, IRONPCTSAT Lab Results  Component Value Date   RBC 5.05 02/18/2020   No results found for: KPAFRELGTCHN, LAMBDASER, KAPLAMBRATIO No results found for: IGGSERUM, IGA, IGMSERUM No results found for: Odetta Pink, SPEI   Chemistry      Component Value Date/Time   NA 139 07/20/2019 1409   K 3.9 07/20/2019 1409   CL 105 07/20/2019 1409   CO2 28 07/20/2019 1409   BUN 6 07/20/2019 1409   CREATININE 0.69 07/20/2019 1409   CREATININE 0.59 06/30/2017 1536      Component Value Date/Time   CALCIUM 9.4 07/20/2019 1409   ALKPHOS 69 07/20/2019 1409   AST 26 07/20/2019 1409   ALT 24 07/20/2019 1409   BILITOT 0.6 07/20/2019 1409       Impression and Plan: Jenna Pope is a very pleasant 49 yo caucasian female with reactive leukocytosis secondary to smoking and arthritis.  She continues to do well and her counts remain stable.  Her WBC count is 11.0, Hgb 15.6 and platelets 238.  At this point, we can let her go from our office and follow-up only as needed.  She is in agreement and will contact our office with any questions or concerns. We can certainly see her sooner if needed.   Laverna Peace, NP 7/9/20214:25 PM

## 2020-02-21 ENCOUNTER — Telehealth: Payer: Self-pay | Admitting: Hematology & Oncology

## 2020-02-21 LAB — LACTATE DEHYDROGENASE: LDH: 208 U/L — ABNORMAL HIGH (ref 98–192)

## 2020-02-21 NOTE — Telephone Encounter (Signed)
Will follow-up only as needed now. Per 7/9 los

## 2020-02-24 ENCOUNTER — Ambulatory Visit (INDEPENDENT_AMBULATORY_CARE_PROVIDER_SITE_OTHER): Payer: BC Managed Care – PPO | Admitting: Family Medicine

## 2020-02-24 ENCOUNTER — Encounter: Payer: Self-pay | Admitting: Family Medicine

## 2020-02-24 ENCOUNTER — Other Ambulatory Visit: Payer: Self-pay

## 2020-02-24 ENCOUNTER — Encounter: Payer: Self-pay | Admitting: Gastroenterology

## 2020-02-24 VITALS — BP 123/81 | HR 86 | Temp 97.8°F | Resp 16 | Ht 61.0 in | Wt 260.4 lb

## 2020-02-24 DIAGNOSIS — E559 Vitamin D deficiency, unspecified: Secondary | ICD-10-CM | POA: Diagnosis not present

## 2020-02-24 DIAGNOSIS — R310 Gross hematuria: Secondary | ICD-10-CM | POA: Diagnosis not present

## 2020-02-24 DIAGNOSIS — Z0000 Encounter for general adult medical examination without abnormal findings: Secondary | ICD-10-CM | POA: Diagnosis not present

## 2020-02-24 DIAGNOSIS — Z1211 Encounter for screening for malignant neoplasm of colon: Secondary | ICD-10-CM

## 2020-02-24 LAB — BASIC METABOLIC PANEL
BUN: 12 mg/dL (ref 6–23)
CO2: 28 mEq/L (ref 19–32)
Calcium: 9.5 mg/dL (ref 8.4–10.5)
Chloride: 105 mEq/L (ref 96–112)
Creatinine, Ser: 0.63 mg/dL (ref 0.40–1.20)
GFR: 100.51 mL/min (ref >60.00)
Glucose, Bld: 89 mg/dL (ref 70–99)
Potassium: 4.6 mEq/L (ref 3.5–5.1)
Sodium: 140 mEq/L (ref 135–145)

## 2020-02-24 LAB — CBC WITH DIFFERENTIAL/PLATELET
Basophils Absolute: 0 10*3/uL (ref 0.0–0.1)
Basophils Relative: 0.2 % (ref 0.0–3.0)
Eosinophils Absolute: 0.1 10*3/uL (ref 0.0–0.7)
Eosinophils Relative: 0.7 % (ref 0.0–5.0)
HCT: 45 % (ref 36.0–46.0)
Hemoglobin: 15.1 g/dL — ABNORMAL HIGH (ref 12.0–15.0)
Lymphocytes Relative: 24 % (ref 12.0–46.0)
Lymphs Abs: 3.2 10*3/uL (ref 0.7–4.0)
MCHC: 33.6 g/dL (ref 30.0–36.0)
MCV: 91.7 fl (ref 78.0–100.0)
Monocytes Absolute: 0.9 10*3/uL (ref 0.1–1.0)
Monocytes Relative: 6.9 % (ref 3.0–12.0)
Neutro Abs: 9.2 10*3/uL — ABNORMAL HIGH (ref 1.4–7.7)
Neutrophils Relative %: 68.2 % (ref 43.0–77.0)
Platelets: 236 10*3/uL (ref 150.0–400.0)
RBC: 4.9 Mil/uL (ref 3.87–5.11)
RDW: 12.9 % (ref 11.5–15.5)
WBC: 13.5 10*3/uL — ABNORMAL HIGH (ref 4.0–10.5)

## 2020-02-24 LAB — POCT URINALYSIS DIPSTICK
Bilirubin, UA: NEGATIVE
Glucose, UA: NEGATIVE
Ketones, UA: NEGATIVE
Nitrite, UA: NEGATIVE
Protein, UA: POSITIVE — AB
Spec Grav, UA: 1.025 (ref 1.010–1.025)
Urobilinogen, UA: 0.2 E.U./dL
pH, UA: 6 (ref 5.0–8.0)

## 2020-02-24 LAB — HEPATIC FUNCTION PANEL
ALT: 15 U/L (ref 0–35)
AST: 15 U/L (ref 0–37)
Albumin: 4.1 g/dL (ref 3.5–5.2)
Alkaline Phosphatase: 73 U/L (ref 39–117)
Bilirubin, Direct: 0.1 mg/dL (ref 0.0–0.3)
Total Bilirubin: 0.5 mg/dL (ref 0.2–1.2)
Total Protein: 6.3 g/dL (ref 6.0–8.3)

## 2020-02-24 LAB — LIPID PANEL
Cholesterol: 169 mg/dL (ref 0–200)
HDL: 32.2 mg/dL — ABNORMAL LOW (ref >39.00)
LDL Cholesterol: 109 mg/dL — ABNORMAL HIGH (ref 0–99)
NonHDL: 137.24
Total CHOL/HDL Ratio: 5
Triglycerides: 143 mg/dL (ref 0.0–149.0)
VLDL: 28.6 mg/dL (ref 0.0–40.0)

## 2020-02-24 LAB — VITAMIN D 25 HYDROXY (VIT D DEFICIENCY, FRACTURES): VITD: 20.27 ng/mL — ABNORMAL LOW (ref 30.00–100.00)

## 2020-02-24 LAB — TSH: TSH: 4.22 u[IU]/mL (ref 0.35–4.50)

## 2020-02-24 NOTE — Progress Notes (Signed)
   Subjective:    Patient ID: Jenna Pope, female    DOB: 1971-05-28, 49 y.o.   MRN: [id number]  HPI CPE- UTD on pap, flu, Tdap.  Due for mammo ([facility]).  Due to start colon cancer screening.  Pt is down 9 lbs since last visit- on Weight Watchers.  Pt wants to start [facility] (just received FDA approval).  She was previously given a sample of Saxenda by provider at work.  Reviewed past medical, surgical, family and social histories.   Review of Systems Patient reports no vision/ hearing changes, adenopathy,fever, persistant/recurrent hoarseness , swallowing issues, chest pain, palpitations, edema, persistant/recurrent cough, hemoptysis, dyspnea (rest/exertional/paroxysmal nocturnal), gastrointestinal bleeding (melena, rectal bleeding), abdominal pain, significant heartburn, bowel changes, Gyn symptoms (abnormal  bleeding, pain),  syncope, focal weakness, memory loss, numbness & tingling, skin/hair/nail changes, abnormal bruising or bleeding, anxiety, or depression.   + hematuria- occurred last night.  Associated w/ pain 'like birthing a baby'.  This visit occurred during the SARS-CoV-2 public health emergency.  Safety protocols were in place, including screening questions prior to the visit, additional usage of staff PPE, and extensive cleaning of exam room while observing appropriate contact time as indicated for disinfecting solutions.       Objective:   Physical Exam General Appearance:    Alert, cooperative, no distress, appears stated age, obese  Head:    Normocephalic, without obvious abnormality, atraumatic  Eyes:    PERRL, conjunctiva/corneas clear, EOM's intact, fundi    benign, both eyes  Ears:    Normal TM's and external ear canals, both ears  Nose:   Deferred due to COVID  Throat:   Neck:   Supple, symmetrical, trachea midline, no adenopathy;    Thyroid: no enlargement/tenderness/nodules  Back:     Symmetric, no curvature, ROM normal, no CVA tenderness  Lungs:      Clear to auscultation bilaterally, respirations unlabored  Chest Wall:    No tenderness or deformity   Heart:    Regular rate and rhythm, S1 and S2 normal, no murmur, rub   or gallop  Breast Exam:    Deferred to GYN  Abdomen:     Soft, non-tender, bowel sounds active all four quadrants,    no masses, no organomegaly  Genitalia:    Deferred to GYN  Rectal:    Extremities:   Extremities normal, atraumatic, no cyanosis or edema  Pulses:   2+ and symmetric all extremities  Skin:   Skin color, texture, turgor normal, no rashes or lesions  Lymph nodes:   Cervical, supraclavicular, and axillary nodes normal  Neurologic:   CNII-XII intact, normal strength, sensation and reflexes    throughout          Assessment & Plan:  Hematuria- pt reports she had severe pain last night w/ bladder pressure and visible blood.  This AM she was able to urinate w/o the pressure and no blood.  Only mild residual pain.  She suspects she had a kidney stone.  Will get UA to assess for infection.  Pt expressed understanding and is in agreement w/ plan.

## 2020-02-24 NOTE — Assessment & Plan Note (Signed)
Check labs and replete prn. 

## 2020-02-24 NOTE — Assessment & Plan Note (Signed)
Pt's PE WNL w/ exception of obesity.  With the change in colon cancer screening guidelines will refer for colonoscopy.  Encouraged pt to call GYN and schedule mammo.  Discussed need for COVID vaccine.  Check labs.  Anticipatory guidance provided.

## 2020-02-24 NOTE — Patient Instructions (Addendum)
Follow up in 6 months to recheck BP and cholesterol We'll notify you of your lab results and make any changes if needed Continue to work on healthy diet and regular exercise- you can do it! Restart the Saxenda until [place] gets picked up by your insurance company Get your COVID shot! Call and schedule your mammogram! Call with any questions or concerns Have a great summer!!

## 2020-02-24 NOTE — Assessment & Plan Note (Signed)
Ongoing issue for pt.  She is down 9 lbs since last visit- applauded her efforts.  She is currently on Weight Watchers.  She is interested in [place] that just received FDA approval.  Discussed that this has not yet been picked up by insurance formularies and would be astronomically expensive.  Discussed Saxenda.  She reports the provider at work had previously given her a sample.  She was going to ask for a prescription from work.  Check labs to risk stratify.  Will follow.

## 2020-02-25 ENCOUNTER — Other Ambulatory Visit: Payer: Self-pay | Admitting: General Practice

## 2020-02-25 MED ORDER — VITAMIN D (ERGOCALCIFEROL) 1.25 MG (50000 UNIT) PO CAPS
50000.0000 [IU] | ORAL_CAPSULE | ORAL | 0 refills | Status: DC
Start: 2020-02-25 — End: 2020-04-13

## 2020-02-26 LAB — URINE CULTURE
MICRO NUMBER:: 10710315
SPECIMEN QUALITY:: ADEQUATE

## 2020-02-28 ENCOUNTER — Other Ambulatory Visit: Payer: Self-pay | Admitting: General Practice

## 2020-02-28 MED ORDER — CEPHALEXIN 500 MG PO CAPS
500.0000 mg | ORAL_CAPSULE | Freq: Two times a day (BID) | ORAL | 0 refills | Status: DC
Start: 2020-02-28 — End: 2020-04-13

## 2020-04-13 ENCOUNTER — Encounter: Payer: Self-pay | Admitting: Gastroenterology

## 2020-04-13 ENCOUNTER — Other Ambulatory Visit: Payer: Self-pay

## 2020-04-13 ENCOUNTER — Ambulatory Visit (AMBULATORY_SURGERY_CENTER): Payer: Self-pay | Admitting: *Deleted

## 2020-04-13 VITALS — Ht 61.5 in | Wt 263.4 lb

## 2020-04-13 DIAGNOSIS — Z1211 Encounter for screening for malignant neoplasm of colon: Secondary | ICD-10-CM

## 2020-04-13 MED ORDER — SUTAB 1479-225-188 MG PO TABS: 1.0000 | ORAL_TABLET | ORAL | 0 refills | Status: DC

## 2020-04-13 NOTE — Progress Notes (Signed)
Patient is here in-person for PV. Patient denies any allergies to eggs or soy. Patient denies any problems with anesthesia/sedation. Patient denies any oxygen use at home. Patient denies taking any diet/weight loss medications or blood thinners. Patient is not being treated for MRSA or C-diff. Patient is aware of our care-partner policy and [facility] safety protocol. EMMI education sent to mychart   COVID-19 vaccines completed  04/11/20 per pt. Prep Prescription coupon given to the patient.

## 2020-04-24 ENCOUNTER — Encounter: Payer: Self-pay | Admitting: Family Medicine

## 2020-04-28 ENCOUNTER — Encounter: Payer: Self-pay | Admitting: Gastroenterology

## 2020-04-28 ENCOUNTER — Other Ambulatory Visit: Payer: Self-pay

## 2020-04-28 ENCOUNTER — Ambulatory Visit (AMBULATORY_SURGERY_CENTER): Payer: BC Managed Care – PPO | Admitting: Gastroenterology

## 2020-04-28 VITALS — BP 145/86 | HR 76 | Temp 98.4°F | Resp 23 | Ht 61.5 in | Wt 263.4 lb

## 2020-04-28 DIAGNOSIS — D127 Benign neoplasm of rectosigmoid junction: Secondary | ICD-10-CM | POA: Diagnosis not present

## 2020-04-28 DIAGNOSIS — D125 Benign neoplasm of sigmoid colon: Secondary | ICD-10-CM

## 2020-04-28 DIAGNOSIS — Z1211 Encounter for screening for malignant neoplasm of colon: Secondary | ICD-10-CM

## 2020-04-28 DIAGNOSIS — D128 Benign neoplasm of rectum: Secondary | ICD-10-CM | POA: Diagnosis not present

## 2020-04-28 MED ORDER — SODIUM CHLORIDE 0.9 % IV SOLN: 500.0000 mL | Freq: Once | INTRAVENOUS | Status: AC

## 2020-04-28 NOTE — Progress Notes (Signed)
Called to room to assist during endoscopic procedure.  Patient ID and intended procedure confirmed with present staff. Received instructions for my participation in the procedure from the performing physician.  

## 2020-04-28 NOTE — Progress Notes (Signed)
pt tolerated well. VSS. awake and to recovery. Report given to RN. Lidocaine 2% 16ml given upon initiation of propofol as per Dr. Havery Moros.

## 2020-04-28 NOTE — Op Note (Signed)
[facility] Patient Name: Jenna Pope Procedure Date: 04/28/2020 7:46 AM MRN: [id number] Endoscopist: Remo Lipps P. Havery Moros , MD Age: 49 Referring MD:  Date of Birth: [birth year] Gender: Female Account #: [id number] Procedure:                Colonoscopy Indications:              Screening for colorectal malignant neoplasm, This                            is the patient's first colonoscopy Medicines:                Monitored Anesthesia Care Procedure:                Pre-Anesthesia Assessment:                           - Prior to the procedure, a History and Physical                            was performed, and patient medications and                            allergies were reviewed. The patient's tolerance of                            previous anesthesia was also reviewed. The risks                            and benefits of the procedure and the sedation                            options and risks were discussed with the patient.                            All questions were answered, and informed consent                            was obtained. Prior Anticoagulants: The patient has                            taken no previous anticoagulant or antiplatelet                            agents. ASA Grade Assessment: III - A patient with                            severe systemic disease. After reviewing the risks                            and benefits, the patient was deemed in                            satisfactory condition to undergo the procedure.  After obtaining informed consent, the colonoscope                            was passed under direct vision. Throughout the                            procedure, the patient's blood pressure, pulse, and                            oxygen saturations were monitored continuously. The                            Colonoscope was introduced through the anus and                            advanced to the the  cecum, identified by                            appendiceal orifice and ileocecal valve. The                            colonoscopy was performed without difficulty. The                            patient tolerated the procedure well. The quality                            of the bowel preparation was good. The terminal                            ileum, ileocecal valve, appendiceal orifice, and                            rectum were photographed. Scope In: 8:01:16 AM Scope Out: 8:24:23 AM Scope Withdrawal Time: 0 hours 18 minutes 8 seconds  Total Procedure Duration: 0 hours 23 minutes 7 seconds  Findings:                 The perianal and digital rectal examinations were                            normal.                           The terminal ileum appeared normal.                           Two sessile polyps were found in the sigmoid colon.                            The polyps were 3 to 7 mm in size. These polyps                            were removed with a cold snare. Resection and  retrieval were complete.                           Two sessile polyps were found in the recto-sigmoid                            colon. The polyps were 2 to 3 mm in size. These                            polyps were removed with a cold snare. Resection                            and retrieval were complete.                           The exam was otherwise without abnormality. Complications:            No immediate complications. Estimated blood loss:                            Minimal. Estimated Blood Loss:     Estimated blood loss was minimal. Impression:               - The examined portion of the ileum was normal.                           - Two 3 to 7 mm polyps in the sigmoid colon,                            removed with a cold snare. Resected and retrieved.                           - Two 2 to 3 mm polyps at the recto-sigmoid colon,                            removed with  a cold snare. Resected and retrieved.                           - The examination was otherwise normal. Recommendation:           - Patient has a contact number available for                            emergencies. The signs and symptoms of potential                            delayed complications were discussed with the                            patient. Return to normal activities tomorrow.                            Written discharge instructions were provided to the  patient.                           - Resume previous diet.                           - Continue present medications.                           - Await pathology results. Remo Lipps P. Nicolle Heward, MD 04/28/2020 8:29:11 AM This report has been signed electronically.

## 2020-04-28 NOTE — Progress Notes (Signed)
Patient ID: Jenna Pope, female   DOB: 20-Jul-1971, 49 y.o.   MRN: [id number]  350 cc NS infused

## 2020-04-28 NOTE — Progress Notes (Signed)
Patient ID: Jenna Pope, female   DOB: 07-01-1971, 49 y.o.   MRN: [id number] Dr In to speak with pt

## 2020-04-28 NOTE — Discharge Instructions (Signed)
Resume previous medications. Handouts on findings given to patient. (polyps) Await pathology for final recommendations.  YOU HAD AN ENDOSCOPIC PROCEDURE TODAY AT [facility]:   Refer to the procedure report that was given to you for any specific questions about what was found during the examination.  If the procedure report does not answer your questions, please call your gastroenterologist to clarify.  If you requested that your care partner not be given the details of your procedure findings, then the procedure report has been included in a sealed envelope for you to review at your convenience later.  YOU SHOULD EXPECT: Some feelings of bloating in the abdomen. Passage of more gas than usual.  Walking can help get rid of the air that was put into your GI tract during the procedure and reduce the bloating. If you had a lower endoscopy (such as a colonoscopy or flexible sigmoidoscopy) you may notice spotting of blood in your stool or on the toilet paper. If you underwent a bowel prep for your procedure, you may not have a normal bowel movement for a few days.  Please Note:  You might notice some irritation and congestion in your nose or some drainage.  This is from the oxygen used during your procedure.  There is no need for concern and it should clear up in a day or so.  SYMPTOMS TO REPORT IMMEDIATELY:   Following lower endoscopy (colonoscopy or flexible sigmoidoscopy):  Excessive amounts of blood in the stool  Significant tenderness or worsening of abdominal pains  Swelling of the abdomen that is new, acute  Fever of 100F or higher   For urgent or emergent issues, a gastroenterologist can be reached at any hour by calling [phone]. Do not use MyChart messaging for urgent concerns.    DIET:  We do recommend a small meal at first, but then you may proceed to your regular diet.  Drink plenty of fluids but you should avoid alcoholic beverages for 24 hours.  ACTIVITY:   You should plan to take it easy for the rest of today and you should NOT DRIVE or use heavy machinery until tomorrow (because of the sedation medicines used during the test).    FOLLOW UP: Our staff will call the number listed on your records 48-72 hours following your procedure to check on you and address any questions or concerns that you may have regarding the information given to you following your procedure. If we do not reach you, we will leave a message.  We will attempt to reach you two times.  During this call, we will ask if you have developed any symptoms of COVID 19. If you develop any symptoms (ie: fever, flu-like symptoms, shortness of breath, cough etc.) before then, please call [phone].  If you test positive for Covid 19 in the 2 weeks post procedure, please call and report this information to [place].    If any biopsies were taken you will be contacted by phone or by letter within the next 1-3 weeks.  Please call us at [phone] if you have not heard about the biopsies in 3 weeks.    SIGNATURES/CONFIDENTIALITY: You and/or your care partner have signed paperwork which will be entered into your electronic medical record.  These signatures attest to the fact that that the information above on your After Visit Summary has been reviewed and is understood.  Full responsibility of the confidentiality of this discharge information lies with you and/or your care-partner.

## 2020-04-28 NOTE — Progress Notes (Signed)
Pt. Reports no change in her medical or surgical history since her pre-visit 04/13/2020.

## 2020-05-02 ENCOUNTER — Telehealth: Payer: Self-pay | Admitting: *Deleted

## 2020-05-02 NOTE — Telephone Encounter (Signed)
No answer for post procedure call back. Left message for patient to call with questions or concerns. 

## 2020-05-02 NOTE — Telephone Encounter (Signed)
First follow up call attempt.  Message left to call if any questions or concerns. 

## 2020-05-17 DIAGNOSIS — Z1231 Encounter for screening mammogram for malignant neoplasm of breast: Secondary | ICD-10-CM | POA: Diagnosis not present

## 2020-06-26 ENCOUNTER — Encounter: Payer: Self-pay | Admitting: Family Medicine

## 2020-06-26 MED ORDER — ALPRAZOLAM 0.5 MG PO TABS
ORAL_TABLET | ORAL | 1 refills | Status: DC
Start: 2020-06-26 — End: 2020-10-04

## 2020-07-14 ENCOUNTER — Telehealth: Payer: Self-pay | Admitting: *Deleted

## 2020-07-14 NOTE — Telephone Encounter (Signed)
Left patient a message to call and schedule new patient appointment with Dr. Sabra Heck for 07/19/2020.

## 2020-07-19 ENCOUNTER — Encounter: Payer: BC Managed Care – PPO | Admitting: Obstetrics & Gynecology

## 2020-08-09 ENCOUNTER — Other Ambulatory Visit: Payer: Self-pay

## 2020-08-09 ENCOUNTER — Encounter: Payer: Self-pay | Admitting: Obstetrics & Gynecology

## 2020-08-09 ENCOUNTER — Other Ambulatory Visit (HOSPITAL_COMMUNITY)
Admission: RE | Admit: 2020-08-09 | Discharge: 2020-08-09 | Disposition: A | Discharge status: Home / Self Care | Payer: BC Managed Care – PPO | Source: Ambulatory Visit | Attending: Obstetrics & Gynecology | Admitting: Obstetrics & Gynecology

## 2020-08-09 ENCOUNTER — Ambulatory Visit (INDEPENDENT_AMBULATORY_CARE_PROVIDER_SITE_OTHER): Payer: BC Managed Care – PPO | Admitting: Obstetrics & Gynecology

## 2020-08-09 VITALS — BP 147/88 | HR 88 | Resp 16 | Ht 61.0 in | Wt 243.0 lb

## 2020-08-09 DIAGNOSIS — Z01419 Encounter for gynecological examination (general) (routine) without abnormal findings: Secondary | ICD-10-CM | POA: Diagnosis not present

## 2020-08-09 DIAGNOSIS — D251 Intramural leiomyoma of uterus: Secondary | ICD-10-CM

## 2020-08-09 DIAGNOSIS — Z124 Encounter for screening for malignant neoplasm of cervix: Secondary | ICD-10-CM

## 2020-08-09 DIAGNOSIS — Z1231 Encounter for screening mammogram for malignant neoplasm of breast: Secondary | ICD-10-CM | POA: Diagnosis not present

## 2020-08-09 DIAGNOSIS — L680 Hirsutism: Secondary | ICD-10-CM | POA: Diagnosis not present

## 2020-08-09 DIAGNOSIS — D72823 Leukemoid reaction: Secondary | ICD-10-CM

## 2020-08-09 DIAGNOSIS — F418 Other specified anxiety disorders: Secondary | ICD-10-CM

## 2020-08-09 DIAGNOSIS — Z23 Encounter for immunization: Secondary | ICD-10-CM | POA: Diagnosis not present

## 2020-08-09 DIAGNOSIS — I10 Essential (primary) hypertension: Secondary | ICD-10-CM

## 2020-08-09 DIAGNOSIS — E039 Hypothyroidism, unspecified: Secondary | ICD-10-CM

## 2020-08-09 NOTE — Progress Notes (Signed)
49 y.o. G70P1000 Married White or Caucasian female here for annual exam/new patient exam.  Former pt of Konrad Dolores, MD, at [facility].  Felt like he didn't remember her at all.  This made her feel uncomfortable so she decided to change practices.    H/o fibroids.  Typically has heavy cycles.  H/o endometrial ablation in 2007.  Pt reports this didn't do anything for her bleeding and then an Mirena IUD.  This was replaced after 5 years.  The second one was imbedded in the cervix and had to be surgically removed.  Pt's cycles are now less regular.  She skipped 3 months between last two cycles.  Flow typically last 6-7 days.  She does pass clots.    Had colonoscopy 04/28/2020 with Dr. Adela Lank.    Having some issues with facial hair growth.  Has not been diagnosed with PCOS but feels she has this.    Pt had lab work done at work in June.  This was screening blood work and some hormonal testing was done as well.  FSH was 30.  She was told she was in menopause.  Patient's last menstrual period was 07/29/2020.          Sexually active: Yes.    The current method of family planning is none.    Smoker:  yes  Health Maintenance: Pap:  Will do today History of abnormal Pap:  no MMG:  05/2020 Colonoscopy:  04/2020   reports that she has been smoking cigarettes. She has a 15.00 pack-year smoking history. She has never used smokeless tobacco. She reports previous alcohol use. She reports that she does not use drugs.  Past Medical History:  Diagnosis Date  . Anxiety   . Arthritis    knees   . Carpal tunnel syndrome   . Family history of anesthesia complication    had an aunt cardiac arrest during a knee surgery  . Fibroids   . GERD (gastroesophageal reflux disease)   . Hypertension   . Hypothyroidism   . Obesity   . Shortness of breath    with exertion   . Sleep apnea    uses cpap since 2/13  setting at 12     Past Surgical History:  Procedure Laterality Date  . BREAST REDUCTION  SURGERY    . CARPAL TUNNEL RELEASE  04/15/2012   Procedure: CARPAL TUNNEL RELEASE;  Surgeon: Nicki Reaper, MD;  Location: [facility];  Service: Orthopedics;  Laterality: Right;  . COLONOSCOPY    . LAPAROSCOPIC GASTRIC BANDING N/A 02/06/2015   Procedure: LAP BAND PLACEMENT;  Surgeon: Luretha Murphy, MD;  Location: [facility];  Service: General;  Laterality: N/A;  . merina birth control surgical removal   2015   . OVARIAN CYST REMOVAL    . UPPER GASTROINTESTINAL ENDOSCOPY     normal  . WISDOM TOOTH EXTRACTION      Current Outpatient Medications  Medication Sig Dispense Refill  . albuterol (VENTOLIN HFA) 108 (90 Base) MCG/ACT inhaler Inhale 2 puffs into the lungs every 6 (six) hours as needed for wheezing or shortness of breath. 18 g 3  . ALPRAZolam (XANAX) 0.5 MG tablet TAKE 1 TABLET BY MOUTH TWICE A DAY AS NEEDED FOR ANXIETY 60 tablet 1  . cholecalciferol (VITAMIN D) 1000 units tablet Take 5,000 Units by mouth daily.    . fenofibrate 160 MG tablet TAKE ONE TABLET BY MOUTH ONE TIME DAILY  90 tablet 0  . hydrochlorothiazide (HYDRODIURIL) 12.5  MG tablet Take 1 tablet (12.5 mg total) by mouth daily. 90 tablet 3  . ibuprofen (ADVIL,MOTRIN) 200 MG tablet Take 400 mg by mouth every 6 (six) hours as needed for moderate pain.    Marland Kitchen levothyroxine (SYNTHROID, LEVOTHROID) 50 MCG tablet Take 1 tablet (50 mcg total) by mouth daily. 90 tablet 3  . loratadine (CLARITIN) 10 MG tablet Take 1 tablet (10 mg total) by mouth daily. 30 tablet 0  . losartan (COZAAR) 100 MG tablet Take 1 tablet (100 mg total) by mouth daily. 90 tablet 1  . meclizine (ANTIVERT) 25 MG tablet Take 1 tablet (25 mg total) 3 (three) times daily as needed by mouth for dizziness. 30 tablet 0  . Multiple Vitamin (MULTIVITAMIN) tablet Take 1 tablet by mouth daily.    . WEGOVY 0.25 MG/0.5ML SOAJ INJECT 0.5ML UNDER THE SKIN ONCE WEEKLY FOR 28 DAYS     Current Facility-Administered Medications  Medication Dose Route Frequency Provider  Last Rate Last Admin  . 0.9 %  sodium chloride infusion  500 mL Intravenous Once Armbruster, Carlota Raspberry, MD        Family History  Problem Relation Age of Onset  . Arthritis Mother   . Alcohol abuse Mother   . Colon polyps Mother   . Hyperlipidemia Father   . Heart disease Father   . Stroke Father   . Hypertension Father   . Colon polyps Father   . Cancer Son   . Arthritis Maternal Aunt   . Cancer Maternal Uncle   . Alcohol abuse Maternal Grandfather   . Colon cancer Neg Hx   . Esophageal cancer Neg Hx   . Stomach cancer Neg Hx   . Rectal cancer Neg Hx     Review of Systems  Skin:       Facial hair  All other systems reviewed and are negative.   Exam:   Resp 16   Ht 5\' 1"  (1.549 m)   Wt 243 lb (110.2 kg)   LMP 07/29/2020   BMI 45.91 kg/m   Height: 5\' 1"  (154.9 cm)  General appearance: alert, cooperative and appears stated age Head: Normocephalic, without obvious abnormality, atraumatic Neck: no adenopathy, supple, symmetrical, trachea midline and thyroid normal to inspection and palpation Lungs: clear to auscultation bilaterally Breasts: normal appearance, no masses or tenderness Heart: regular rate and rhythm Abdomen: soft, non-tender; bowel sounds normal; no masses,  no organomegaly Extremities: extremities normal, atraumatic, no cyanosis or edema Skin: Skin color, texture, turgor normal. No rashes or lesions Lymph nodes: Cervical, supraclavicular, and axillary nodes normal. No abnormal inguinal nodes palpated Neurologic: Grossly normal   Pelvic: External genitalia:  no lesions              Urethra:  normal appearing urethra with no masses, tenderness or lesions              Bartholins and Skenes: normal                 Vagina: normal appearing vagina with normal color and discharge, no lesions              Cervix: no lesions              Pap taken: Yes.   Bimanual Exam:  Uterus:  normal size, contour, position, consistency, mobility, non-tender               Adnexa: normal adnexa and no mass, fullness, tenderness  Rectovaginal: Confirms               Anus:  normal sphincter tone, no lesions  Chaperone, Mariel Aloe, RN, was present for exam.  Assessment/Plan:   1. Well woman exam with routine gynecological exam - Pap with HR HPV obtained today  - Flu vaccine administered - Order for [facility] for next year placed as [facility]  2. Hirsutism - Testosterone, Total, LC/MS/MS - D/w possible diagnosis of PCOS and possible treatment options  3.  Fibroids/irregular bleeding/FSH of 30 obtained in June - PUS ordered.  If endometrium thickened, will have pt return for endometrial biopsy.  4. Morbid obesity (HCC) - On Wegovy.  This is managed by Devoria Glassing with [facility].  5. Primary hypertension - On Losartin and HCTZ.  Followed by Dr. Beverely Low.  6. Hypothyroidism, unspecified type - On Levothyroxine.  Followed by Dr. Beverely Low.  7. Leukemoid reaction - Seeing Emeline Gins, [place], with heme/onc at [facility].  No abnormalities have been found with evaluation.

## 2020-08-10 LAB — CYTOLOGY - PAP
Adequacy: ABSENT
Comment: NEGATIVE
Diagnosis: NEGATIVE
High risk HPV: NEGATIVE

## 2020-08-11 LAB — TESTOSTERONE, TOTAL, LC/MS/MS: Testosterone, Total, LC-MS-MS: 28 ng/dL (ref 2–45)

## 2020-08-12 ENCOUNTER — Ambulatory Visit (HOSPITAL_BASED_OUTPATIENT_CLINIC_OR_DEPARTMENT_OTHER)
Admission: RE | Admit: 2020-08-12 | Discharge: 2020-08-12 | Disposition: A | Discharge status: Home / Self Care | Payer: BC Managed Care – PPO | Source: Ambulatory Visit | Attending: Obstetrics & Gynecology | Admitting: Obstetrics & Gynecology

## 2020-08-12 ENCOUNTER — Other Ambulatory Visit: Payer: Self-pay

## 2020-08-12 DIAGNOSIS — D251 Intramural leiomyoma of uterus: Secondary | ICD-10-CM | POA: Insufficient documentation

## 2020-08-12 DIAGNOSIS — D259 Leiomyoma of uterus, unspecified: Secondary | ICD-10-CM | POA: Diagnosis not present

## 2020-08-12 DIAGNOSIS — N920 Excessive and frequent menstruation with regular cycle: Secondary | ICD-10-CM | POA: Diagnosis not present

## 2020-08-25 ENCOUNTER — Ambulatory Visit: Payer: BC Managed Care – PPO | Admitting: Family Medicine

## 2020-08-29 ENCOUNTER — Ambulatory Visit: Payer: BC Managed Care – PPO | Admitting: Family Medicine

## 2020-08-29 ENCOUNTER — Encounter: Payer: Self-pay | Admitting: Family Medicine

## 2020-08-29 ENCOUNTER — Telehealth (INDEPENDENT_AMBULATORY_CARE_PROVIDER_SITE_OTHER): Payer: BC Managed Care – PPO | Admitting: Family Medicine

## 2020-08-29 DIAGNOSIS — I10 Essential (primary) hypertension: Secondary | ICD-10-CM

## 2020-08-29 DIAGNOSIS — E781 Pure hyperglyceridemia: Secondary | ICD-10-CM

## 2020-08-29 NOTE — Progress Notes (Signed)
I connected with  Jenna Pope on 08/29/20 by a video enabled telemedicine application and verified that I am speaking with the correct person using two identifiers.   I discussed the limitations of evaluation and management by telemedicine. The patient expressed understanding and agreed to proceed.

## 2020-08-29 NOTE — Progress Notes (Signed)
Virtual Visit via Video   I connected with patient on 08/29/20 at  9:30 AM EST by a video enabled telemedicine application and verified that I am speaking with the correct person using two identifiers.  Location patient: Home Location provider: [facility], Office Persons participating in the virtual visit: Patient, Provider, Groom (Sabrina M)  I discussed the limitations of evaluation and management by telemedicine and the availability of in person appointments. The patient expressed understanding and agreed to proceed.  Subjective:   HPI:   HTN- chronic problem, on HCTZ 12.5mg  daily, Losartan 100mg  daily.  BP at GYN 147/88- pt was taken w/ Dynamap and she admits to getting very nervous at GYN.  Has not checked BP recently.  Denies CP, SOB, HAs, visual changes, edema.  Hypertriglyceridemia- chronic problem, on Fenofibrate 160mg  daily.  Denies abd pain, N/V.  Obesity- weight at GYN on 12/29 was 243, BMI 45.9.  No regular exercise.  Now on [facility].  She is undergoing PCOS workup w/ Dr Sabra Heck.  ROS:   See pertinent positives and negatives per HPI.  Patient Active Problem List   Diagnosis Date Noted  . Neck pain 01/20/2018  . Nonallopathic lesion of thoracic region 01/20/2018  . Nonallopathic lesion of cervical region 01/20/2018  . Nonallopathic lesion of lumbosacral region 01/20/2018  . Hypertriglyceridemia 10/10/2017  . Vertigo 06/30/2017  . Ganglion cyst 05/07/2017  . Vitamin D deficiency 12/26/2016  . Hypothyroid 07/01/2016  . Physical exam 12/22/2015  . 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 Allergic rhinitis 10/31/2014  . Tobacco use disorder 09/06/2014  . Hypokalemia 04/08/2014  . 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 LBP (low back pain) 11/18/2013  . HTN (hypertension) 04/20/2013  . Morbid obesity ([place]) 04/20/2013  . Depression with anxiety 04/20/2013  . OSA on CPAP 04/20/2013  . Eczema of hand 04/20/2013    Social History   Tobacco Use  . Smoking status:  Current Every Day Smoker    Packs/day: 0.75    Years: 20.00    Pack years: 15.00    Types: Cigarettes  . Smokeless tobacco: Never Used  Substance Use Topics  . Alcohol use: Not Currently    Comment: rare    Current Outpatient Medications:  .  albuterol (VENTOLIN HFA) 108 (90 Base) MCG/ACT inhaler, Inhale 2 puffs into the lungs every 6 (six) hours as needed for wheezing or shortness of breath., Disp: 18 g, Rfl: 3 .  ALPRAZolam (XANAX) 0.5 MG tablet, TAKE 1 TABLET BY MOUTH TWICE A DAY AS NEEDED FOR ANXIETY, Disp: 60 tablet, Rfl: 1 .  cholecalciferol (VITAMIN D) 1000 units tablet, Take 5,000 Units by mouth daily., Disp: , Rfl:  .  fenofibrate 160 MG tablet, TAKE ONE TABLET BY MOUTH ONE TIME DAILY , Disp: 90 tablet, Rfl: 0 .  hydrochlorothiazide (HYDRODIURIL) 12.5 MG tablet, Take 1 tablet (12.5 mg total) by mouth daily., Disp: 90 tablet, Rfl: 3 .  ibuprofen (ADVIL,MOTRIN) 200 MG tablet, Take 400 mg by mouth every 6 (six) hours as needed for moderate pain., Disp: , Rfl:  .  levothyroxine (SYNTHROID, LEVOTHROID) 50 MCG tablet, Take 1 tablet (50 mcg total) by mouth daily., Disp: 90 tablet, Rfl: 3 .  loratadine (CLARITIN) 10 MG tablet, Take 1 tablet (10 mg total) by mouth daily., Disp: 30 tablet, Rfl: 0 .  losartan (COZAAR) 100 MG tablet, Take 1 tablet (100 mg total) by mouth daily., Disp: 90 tablet, Rfl: 1 .  meclizine (ANTIVERT) 25 MG tablet, Take 1 tablet (25 mg total)  3 (three) times daily as needed by mouth for dizziness., Disp: 30 tablet, Rfl: 0 .  WEGOVY 0.25 MG/0.5ML SOAJ, INJECT 0.5ML UNDER THE SKIN ONCE WEEKLY FOR 28 DAYS, Disp: , Rfl:  .  Multiple Vitamin (MULTIVITAMIN) tablet, Take 1 tablet by mouth daily. (Patient not taking: Reported on 08/29/2020), Disp: , Rfl:   Current Facility-Administered Medications:  .  0.9 %  sodium chloride infusion, 500 mL, Intravenous, Once, Armbruster, Carlota Raspberry, MD  Allergies  Allergen Reactions  . Ace Inhibitors Cough    Cough + feels like she has a  lump in her throat.     Objective:   There were no vitals taken for this visit. AAOx3, NAD obese NCAT, EOMI No obvious CN deficits Coloring WNL Pt is able to speak clearly, coherently without shortness of breath or increased work of breathing.  Thought process is linear.  Mood is appropriate.   Assessment and Plan:   HTN- chronic problem.  Currently on Losartan 100mg  daily and HCTZ 12.5mg  daily.  BP reading at GYN was elevated but this was w/ Dynamap and she has known white coat syndrome.  She has not been checking BPs at home.  Currently undergoing PCOS w/u w/ GYN which may make changing to Spironolactone a good idea.  Will continue to follow closely and make adjustments as needed.  Hypertriglyceridemia- chronic problem.  On Fenofibrate daily w/o difficulty.  Discussed need for healthy diet and regular exercise.  Check labs.  Adjust meds prn   Morbid obesity- ongoing issue.  Now on [facility].  Talked about regular exercise and healthy food choices.  Will continue to follow.   Annye Asa, MD 08/29/2020

## 2020-09-01 ENCOUNTER — Other Ambulatory Visit: Payer: BC Managed Care – PPO

## 2020-09-05 ENCOUNTER — Other Ambulatory Visit: Payer: Self-pay

## 2020-09-05 ENCOUNTER — Other Ambulatory Visit (INDEPENDENT_AMBULATORY_CARE_PROVIDER_SITE_OTHER): Payer: BC Managed Care – PPO

## 2020-09-05 DIAGNOSIS — E781 Pure hyperglyceridemia: Secondary | ICD-10-CM

## 2020-09-05 DIAGNOSIS — I10 Essential (primary) hypertension: Secondary | ICD-10-CM

## 2020-09-05 LAB — CBC WITH DIFFERENTIAL/PLATELET
Basophils Absolute: 0.1 10*3/uL (ref 0.0–0.1)
Basophils Relative: 0.5 % (ref 0.0–3.0)
Eosinophils Absolute: 0.2 10*3/uL (ref 0.0–0.7)
Eosinophils Relative: 1.7 % (ref 0.0–5.0)
HCT: 46.6 % — ABNORMAL HIGH (ref 36.0–46.0)
Hemoglobin: 16.1 g/dL — ABNORMAL HIGH (ref 12.0–15.0)
Lymphocytes Relative: 31 % (ref 12.0–46.0)
Lymphs Abs: 3.6 10*3/uL (ref 0.7–4.0)
MCHC: 34.5 g/dL (ref 30.0–36.0)
MCV: 90.2 fl (ref 78.0–100.0)
Monocytes Absolute: 0.8 10*3/uL (ref 0.1–1.0)
Monocytes Relative: 6.5 % (ref 3.0–12.0)
Neutro Abs: 7.1 10*3/uL (ref 1.4–7.7)
Neutrophils Relative %: 60.3 % (ref 43.0–77.0)
Platelets: 209 10*3/uL (ref 150.0–400.0)
RBC: 5.16 Mil/uL — ABNORMAL HIGH (ref 3.87–5.11)
RDW: 13.2 % (ref 11.5–15.5)
WBC: 11.8 10*3/uL — ABNORMAL HIGH (ref 4.0–10.5)

## 2020-09-05 LAB — HEPATIC FUNCTION PANEL
ALT: 20 U/L (ref 0–35)
AST: 22 U/L (ref 0–37)
Albumin: 3.7 g/dL (ref 3.5–5.2)
Alkaline Phosphatase: 65 U/L (ref 39–117)
Bilirubin, Direct: 0.1 mg/dL (ref 0.0–0.3)
Total Bilirubin: 0.6 mg/dL (ref 0.2–1.2)
Total Protein: 6.4 g/dL (ref 6.0–8.3)

## 2020-09-05 LAB — LIPID PANEL
Cholesterol: 168 mg/dL (ref 0–200)
HDL: 27.2 mg/dL — ABNORMAL LOW (ref >39.00)
LDL Cholesterol: 107 mg/dL — ABNORMAL HIGH (ref 0–99)
NonHDL: 140.95
Total CHOL/HDL Ratio: 6
Triglycerides: 170 mg/dL — ABNORMAL HIGH (ref 0.0–149.0)
VLDL: 34 mg/dL (ref 0.0–40.0)

## 2020-09-05 LAB — BASIC METABOLIC PANEL
BUN: 6 mg/dL (ref 6–23)
CO2: 26 mEq/L (ref 19–32)
Calcium: 9.3 mg/dL (ref 8.4–10.5)
Chloride: 106 mEq/L (ref 96–112)
Creatinine, Ser: 0.67 mg/dL (ref 0.40–1.20)
GFR: 102.67 mL/min (ref >60.00)
Glucose, Bld: 82 mg/dL (ref 70–99)
Potassium: 3.8 mEq/L (ref 3.5–5.1)
Sodium: 138 mEq/L (ref 135–145)

## 2020-09-05 LAB — TSH: TSH: 3.74 u[IU]/mL (ref 0.35–4.50)

## 2020-09-22 ENCOUNTER — Encounter: Payer: Self-pay | Admitting: *Deleted

## 2020-09-29 ENCOUNTER — Other Ambulatory Visit (HOSPITAL_BASED_OUTPATIENT_CLINIC_OR_DEPARTMENT_OTHER): Payer: Self-pay | Admitting: Obstetrics & Gynecology

## 2020-09-29 DIAGNOSIS — L680 Hirsutism: Secondary | ICD-10-CM

## 2020-10-04 ENCOUNTER — Encounter: Payer: Self-pay | Admitting: Family Medicine

## 2020-10-04 DIAGNOSIS — F418 Other specified anxiety disorders: Secondary | ICD-10-CM

## 2020-10-04 MED ORDER — ALPRAZOLAM 0.5 MG PO TABS: ORAL_TABLET | ORAL | 1 refills | Status: DC

## 2020-10-04 NOTE — Telephone Encounter (Signed)
Xanax last rx 06/26/20 #60 1 RF LOV: 08/29/20 HTN, Obesity

## 2020-10-11 ENCOUNTER — Other Ambulatory Visit: Payer: Self-pay | Admitting: *Deleted

## 2020-10-11 DIAGNOSIS — L680 Hirsutism: Secondary | ICD-10-CM

## 2020-10-12 DIAGNOSIS — L680 Hirsutism: Secondary | ICD-10-CM | POA: Diagnosis not present

## 2020-10-13 LAB — FOLLICLE STIMULATING HORMONE: FSH: 29.6 m[IU]/mL

## 2020-10-13 LAB — PROLACTIN: Prolactin: 6.5 ng/mL

## 2020-10-13 LAB — INSULIN, RANDOM: Insulin: 18.1 u[IU]/mL

## 2020-10-19 LAB — 17-HYDROXYPROGESTERONE: 17-OH-Progesterone, LC/MS/MS: 34 ng/dL

## 2020-10-21 ENCOUNTER — Other Ambulatory Visit (HOSPITAL_BASED_OUTPATIENT_CLINIC_OR_DEPARTMENT_OTHER): Payer: Self-pay | Admitting: Obstetrics & Gynecology

## 2020-10-21 MED ORDER — SPIRONOLACTONE 50 MG PO TABS: 50.0000 mg | ORAL_TABLET | Freq: Two times a day (BID) | ORAL | 1 refills | Status: DC

## 2021-01-09 ENCOUNTER — Encounter: Payer: Self-pay | Admitting: Family Medicine

## 2021-01-10 ENCOUNTER — Other Ambulatory Visit: Payer: Self-pay

## 2021-01-10 DIAGNOSIS — F418 Other specified anxiety disorders: Secondary | ICD-10-CM

## 2021-01-10 MED ORDER — ALPRAZOLAM 0.5 MG PO TABS: ORAL_TABLET | ORAL | 1 refills | Status: DC

## 2021-01-10 NOTE — Telephone Encounter (Signed)
LFD 10/04/20 #60 with 1 refill LOV 08/29/20 NOV 02/26/21

## 2021-01-24 ENCOUNTER — Other Ambulatory Visit (HOSPITAL_BASED_OUTPATIENT_CLINIC_OR_DEPARTMENT_OTHER): Payer: Self-pay | Admitting: Obstetrics & Gynecology

## 2021-01-24 ENCOUNTER — Encounter (HOSPITAL_BASED_OUTPATIENT_CLINIC_OR_DEPARTMENT_OTHER): Payer: Self-pay

## 2021-01-24 MED ORDER — SPIRONOLACTONE 100 MG PO TABS: 100.0000 mg | ORAL_TABLET | Freq: Two times a day (BID) | ORAL | 2 refills | Status: DC

## 2021-02-07 ENCOUNTER — Encounter: Payer: Self-pay | Admitting: *Deleted

## 2021-02-09 DIAGNOSIS — M170 Bilateral primary osteoarthritis of knee: Secondary | ICD-10-CM | POA: Diagnosis not present

## 2021-02-26 ENCOUNTER — Ambulatory Visit (INDEPENDENT_AMBULATORY_CARE_PROVIDER_SITE_OTHER): Payer: BC Managed Care – PPO | Admitting: Family Medicine

## 2021-02-26 ENCOUNTER — Other Ambulatory Visit: Payer: Self-pay

## 2021-02-26 ENCOUNTER — Encounter: Payer: Self-pay | Admitting: Family Medicine

## 2021-02-26 VITALS — BP 116/70 | HR 89 | Temp 98.0°F | Resp 17 | Ht 61.0 in | Wt 223.0 lb

## 2021-02-26 DIAGNOSIS — E559 Vitamin D deficiency, unspecified: Secondary | ICD-10-CM

## 2021-02-26 DIAGNOSIS — Z0000 Encounter for general adult medical examination without abnormal findings: Secondary | ICD-10-CM | POA: Diagnosis not present

## 2021-02-26 DIAGNOSIS — E65 Localized adiposity: Secondary | ICD-10-CM | POA: Diagnosis not present

## 2021-02-26 LAB — CBC WITH DIFFERENTIAL/PLATELET
Basophils Absolute: 0 10*3/uL (ref 0.0–0.1)
Basophils Relative: 0.3 % (ref 0.0–3.0)
Eosinophils Absolute: 0.1 10*3/uL (ref 0.0–0.7)
Eosinophils Relative: 0.8 % (ref 0.0–5.0)
HCT: 47.8 % — ABNORMAL HIGH (ref 36.0–46.0)
Hemoglobin: 16.4 g/dL — ABNORMAL HIGH (ref 12.0–15.0)
Lymphocytes Relative: 20.8 % (ref 12.0–46.0)
Lymphs Abs: 2.9 10*3/uL (ref 0.7–4.0)
MCHC: 34.2 g/dL (ref 30.0–36.0)
MCV: 94.9 fl (ref 78.0–100.0)
Monocytes Absolute: 0.8 10*3/uL (ref 0.1–1.0)
Monocytes Relative: 5.9 % (ref 3.0–12.0)
Neutro Abs: 10.2 10*3/uL — ABNORMAL HIGH (ref 1.4–7.7)
Neutrophils Relative %: 72.2 % (ref 43.0–77.0)
Platelets: 230 10*3/uL (ref 150.0–400.0)
RBC: 5.04 Mil/uL (ref 3.87–5.11)
RDW: 12.6 % (ref 11.5–15.5)
WBC: 14.1 10*3/uL — ABNORMAL HIGH (ref 4.0–10.5)

## 2021-02-26 LAB — BASIC METABOLIC PANEL
BUN: 9 mg/dL (ref 6–23)
CO2: 22 mEq/L (ref 19–32)
Calcium: 9.4 mg/dL (ref 8.4–10.5)
Chloride: 105 mEq/L (ref 96–112)
Creatinine, Ser: 0.72 mg/dL (ref 0.40–1.20)
GFR: 97.89 mL/min (ref >60.00)
Glucose, Bld: 67 mg/dL — ABNORMAL LOW (ref 70–99)
Potassium: 4.3 mEq/L (ref 3.5–5.1)
Sodium: 137 mEq/L (ref 135–145)

## 2021-02-26 LAB — HEPATIC FUNCTION PANEL
ALT: 16 U/L (ref 0–35)
AST: 18 U/L (ref 0–37)
Albumin: 4.3 g/dL (ref 3.5–5.2)
Alkaline Phosphatase: 54 U/L (ref 39–117)
Bilirubin, Direct: 0.1 mg/dL (ref 0.0–0.3)
Total Bilirubin: 0.7 mg/dL (ref 0.2–1.2)
Total Protein: 6.4 g/dL (ref 6.0–8.3)

## 2021-02-26 LAB — LIPID PANEL
Cholesterol: 148 mg/dL (ref 0–200)
HDL: 32.9 mg/dL — ABNORMAL LOW (ref >39.00)
LDL Cholesterol: 92 mg/dL (ref 0–99)
NonHDL: 114.91
Total CHOL/HDL Ratio: 4
Triglycerides: 116 mg/dL (ref 0.0–149.0)
VLDL: 23.2 mg/dL (ref 0.0–40.0)

## 2021-02-26 LAB — TSH: TSH: 1.65 u[IU]/mL (ref 0.35–5.50)

## 2021-02-26 LAB — VITAMIN D 25 HYDROXY (VIT D DEFICIENCY, FRACTURES): VITD: 35.01 ng/mL (ref 30.00–100.00)

## 2021-02-26 NOTE — Assessment & Plan Note (Signed)
Pt is down 20 lbs!!  Applauded her efforts.  BMI is 42.14  She has large overhanging pannus that weighs quite a bit and makes exercising very difficult.  Will refer to plastic surgery to determine if removal would be appropriate.  Pt expressed understanding and is in agreement w/ plan.

## 2021-02-26 NOTE — Assessment & Plan Note (Signed)
Pt's PE WNL w/ exception of obesity.  UTD on pap, colonoscopy, mammo, immunizations.  Check labs.  Anticipatory guidance provided.

## 2021-02-26 NOTE — Assessment & Plan Note (Signed)
Check labs and replete prn. 

## 2021-02-26 NOTE — Patient Instructions (Signed)
Follow up in 6 months to recheck BP and weight loss progress We'll notify you of your lab results and make any changes if needed Continue to work on healthy diet and regular exercise- you're doing it!!!  You're down 20 lbs!!! We'll call you with your plastic surgery appt Call with any questions or concerns Stay Safe!  Stay Healthy! Have a great summer!!!

## 2021-02-26 NOTE — Progress Notes (Signed)
   Subjective:    Patient ID: Jenna Pope, female    DOB: August 24, 1970, 50 y.o.   MRN: [id number]  HPI CPE- UTD on pap, colonoscopy, mammo, Tdap, flu, COVID.  Reviewed past medical, surgical, family and social histories.   Patient Care Team    Relationship Specialty Notifications Start End  Midge Minium, MD PCP - General Family Medicine  04/19/13   Maisie Fus, MD Consulting Physician Obstetrics and Gynecology  12/22/15     Health Maintenance  Topic Date Due   Pneumococcal Vaccine 45-69 Years old (1 - PCV) Never done   COVID-19 Vaccine (3 - Booster for [place] series) 03/14/2021 (Originally 09/11/2020)   Hepatitis C Screening  02/26/2022 (Originally 04/26/1989)   HIV Screening  02/26/2022 (Originally 04/26/1986)   INFLUENZA VACCINE  03/12/2021   TETANUS/TDAP  07/16/2021   MAMMOGRAM  08/09/2021   COLONOSCOPY (Pts 45-67yrs Insurance coverage will need to be confirmed)  04/29/2023   PAP SMEAR-Modifier  08/10/2023   HPV VACCINES  Aged Out      Review of Systems Patient reports no vision/ hearing changes, adenopathy,fever, persistant/recurrent hoarseness , swallowing issues, chest pain, palpitations, edema, persistant/recurrent cough, hemoptysis, dyspnea (rest/exertional/paroxysmal nocturnal), gastrointestinal bleeding (melena, rectal bleeding), abdominal pain, significant heartburn, bowel changes, GU symptoms (dysuria, hematuria, incontinence), Gyn symptoms (abnormal  bleeding, pain),  syncope, focal weakness, memory loss, numbness & tingling, skin/hair/nail changes, abnormal bruising or bleeding, anxiety, or depression.   + 20 lb weight loss- on Wegovy.  No regular exercise.  This visit occurred during the SARS-CoV-2 public health emergency.  Safety protocols were in place, including screening questions prior to the visit, additional usage of staff PPE, and extensive cleaning of exam room while observing appropriate contact time as indicated for disinfecting solutions.      Objective:    Physical Exam General Appearance:    Alert, cooperative, no distress, appears stated age, obese  Head:    Normocephalic, without obvious abnormality, atraumatic  Eyes:    PERRL, conjunctiva/corneas clear, EOM's intact, fundi    benign, both eyes  Ears:    Normal TM's and external ear canals, both ears  Nose:   Deferred due to COVID  Throat:   Neck:   Supple, symmetrical, trachea midline, no adenopathy;    Thyroid: no enlargement/tenderness/nodules  Back:     Symmetric, no curvature, ROM normal, no CVA tenderness  Lungs:     Clear to auscultation bilaterally, respirations unlabored  Chest Wall:    No tenderness or deformity   Heart:    Regular rate and rhythm, S1 and S2 normal, no murmur, rub   or gallop  Breast Exam:    Deferred to GYN  Abdomen:     Soft, non-tender, bowel sounds active all four quadrants,    no masses, no organomegaly, huge abdominal pannus  Genitalia:    Deferred to GYN  Rectal:    Extremities:   Extremities normal, atraumatic, no cyanosis or edema  Pulses:   2+ and symmetric all extremities  Skin:   Skin color, texture, turgor normal, no rashes or lesions  Lymph nodes:   Cervical, supraclavicular, and axillary nodes normal  Neurologic:   CNII-XII intact, normal strength, sensation and reflexes    throughout          Assessment & Plan:

## 2021-05-25 ENCOUNTER — Other Ambulatory Visit: Payer: Self-pay

## 2021-05-25 ENCOUNTER — Encounter: Payer: Self-pay | Admitting: Plastic Surgery

## 2021-05-25 ENCOUNTER — Ambulatory Visit: Payer: BC Managed Care – PPO | Admitting: Plastic Surgery

## 2021-05-25 DIAGNOSIS — M793 Panniculitis, unspecified: Secondary | ICD-10-CM | POA: Diagnosis not present

## 2021-05-25 NOTE — Progress Notes (Signed)
Patient ID: Jenna Pope, female    DOB: 10-12-1970, 50 y.o.   MRN: [id number]   Chief Complaint  Patient presents with  . consult    The patient is a 50 year old female here for evaluation of her abdomen.  She underwent a lap band with Dr. Hassell Done in 2016.  Unfortunately it did not work.  She started weight watchers and a medication in 2020 and since then has lost 74 pounds.  She is now 5 feet 1 inch tall and weighs 210 pounds.  Her BMI is 39.7 kg/m . She complains of back pain and neck pain.  She is smoking but willing to stop.  She does not have diabetes.  She had breast reduction surgery and carpal tunnel surgery.  Her past medical history is positive for arthritis, thyroid disease, fibroids and sleep apnea.  Her pannus is extremely large and hangs about to her knees.  She conceals it well.  This is clearly contributing to her neck and back pain.  I do not feel any hernia or diastases of her rectus muscles.   Review of Systems  Constitutional:  Positive for activity change.  HENT: Negative.    Eyes: Negative.   Respiratory: Negative.  Negative for shortness of breath.   Cardiovascular: Negative.   Gastrointestinal: Negative.   Endocrine: Negative.   Genitourinary: Negative.   Musculoskeletal:  Positive for back pain and neck pain.  Skin:  Positive for rash.  Neurological: Negative.   Hematological: Negative.   Psychiatric/Behavioral: Negative.     Past Medical History:  Diagnosis Date  . Anxiety   . Arthritis    knees   . Carpal tunnel syndrome   . Family history of anesthesia complication    had an aunt cardiac arrest during a knee surgery  . Fibroids   . GERD (gastroesophageal reflux disease)   . Hypertension   . Hypothyroidism   . Obesity   . Shortness of breath    with exertion   . Sleep apnea    uses cpap since 2/13  setting at 12     Past Surgical History:  Procedure Laterality Date  . BREAST REDUCTION SURGERY    . CARPAL TUNNEL RELEASE  04/15/2012    Procedure: CARPAL TUNNEL RELEASE;  Surgeon: Wynonia Sours, MD;  Location: [place];  Service: Orthopedics;  Laterality: Right;  . COLONOSCOPY    . LAPAROSCOPIC GASTRIC BANDING N/A 02/06/2015   Procedure: LAP BAND PLACEMENT;  Surgeon: Johnathan Hausen, MD;  Location: [facility];  Service: General;  Laterality: N/A;  . merina birth control surgical removal   2015   . OVARIAN CYST REMOVAL    . UPPER GASTROINTESTINAL ENDOSCOPY     normal  . WISDOM TOOTH EXTRACTION        Current Outpatient Medications:  .  albuterol (VENTOLIN HFA) 108 (90 Base) MCG/ACT inhaler, Inhale 2 puffs into the lungs every 6 (six) hours as needed for wheezing or shortness of breath., Disp: 18 g, Rfl: 3 .  ALPRAZolam (XANAX) 0.5 MG tablet, TAKE 1 TABLET BY MOUTH TWICE A DAY AS NEEDED FOR ANXIETY, Disp: 60 tablet, Rfl: 1 .  fenofibrate 160 MG tablet, TAKE ONE TABLET BY MOUTH ONE TIME DAILY , Disp: 90 tablet, Rfl: 0 .  hydrochlorothiazide (HYDRODIURIL) 12.5 MG tablet, Take 1 tablet (12.5 mg total) by mouth daily., Disp: 90 tablet, Rfl: 3 .  ibuprofen (ADVIL,MOTRIN) 200 MG tablet, Take 400 mg by mouth every 6 (six) hours as needed  for moderate pain., Disp: , Rfl:  .  levothyroxine (SYNTHROID, LEVOTHROID) 50 MCG tablet, Take 1 tablet (50 mcg total) by mouth daily., Disp: 90 tablet, Rfl: 3 .  loratadine (CLARITIN) 10 MG tablet, Take 1 tablet (10 mg total) by mouth daily., Disp: 30 tablet, Rfl: 0 .  meclizine (ANTIVERT) 25 MG tablet, Take 1 tablet (25 mg total) 3 (three) times daily as needed by mouth for dizziness., Disp: 30 tablet, Rfl: 0 .  spironolactone (ALDACTONE) 100 MG tablet, Take 1 tablet (100 mg total) by mouth 2 (two) times daily., Disp: 180 tablet, Rfl: 2 .  WEGOVY 0.25 MG/0.5ML SOAJ, INJECT 0.5ML UNDER THE SKIN ONCE WEEKLY FOR 28 DAYS, Disp: , Rfl:  .  cholecalciferol (VITAMIN D) 1000 units tablet, Take 5,000 Units by mouth daily., Disp: , Rfl:   Current Facility-Administered Medications:  .  0.9 %   sodium chloride infusion, 500 mL, Intravenous, Once, Armbruster, Carlota Raspberry, MD   Objective:   Vitals:   05/25/21 0952  BP: 127/73  Pulse: (!) 106  SpO2: 99%    Physical Exam Vitals and nursing note reviewed.  Constitutional:      Appearance: Normal appearance.  HENT:     Head: Normocephalic and atraumatic.  Cardiovascular:     Rate and Rhythm: Normal rate.     Pulses: Normal pulses.  Pulmonary:     Effort: Pulmonary effort is normal. No respiratory distress.  Abdominal:     General: There is no distension.     Palpations: Abdomen is soft.     Tenderness: There is no abdominal tenderness. There is no guarding.     Hernia: No hernia is present.  Skin:    General: Skin is warm.     Coloration: Skin is not jaundiced or pale.     Findings: Rash present. No bruising or erythema.  Neurological:     General: No focal deficit present.     Mental Status: She is alert and oriented to person, place, and time.  Psychiatric:        Mood and Affect: Mood normal.        Behavior: Behavior normal.        Thought Content: Thought content normal.    Assessment & Plan:  Panniculitis  I think the patient is a very good candidate for panniculectomy.   I explained that this is the tissue below the umbilicus which is different from an abdominoplasty.  She will need to be 3 months tobacco free before the surgery.  I called to talk to her about this because I saw the smoking after she left.  As soon as she has stopped smoking she is to call us and will start the clock for her.  [place], DO

## 2021-07-20 ENCOUNTER — Encounter: Payer: Self-pay | Admitting: Family Medicine

## 2021-07-20 ENCOUNTER — Telehealth (INDEPENDENT_AMBULATORY_CARE_PROVIDER_SITE_OTHER): Payer: BC Managed Care – PPO | Admitting: Family Medicine

## 2021-07-20 DIAGNOSIS — F418 Other specified anxiety disorders: Secondary | ICD-10-CM | POA: Diagnosis not present

## 2021-07-20 DIAGNOSIS — M1990 Unspecified osteoarthritis, unspecified site: Secondary | ICD-10-CM | POA: Insufficient documentation

## 2021-07-20 DIAGNOSIS — R0789 Other chest pain: Secondary | ICD-10-CM

## 2021-07-20 DIAGNOSIS — G44209 Tension-type headache, unspecified, not intractable: Secondary | ICD-10-CM | POA: Insufficient documentation

## 2021-07-20 MED ORDER — ALPRAZOLAM 0.5 MG PO TABS: ORAL_TABLET | ORAL | 1 refills | Status: DC

## 2021-07-20 MED ORDER — GUAIFENESIN-CODEINE 100-10 MG/5ML PO SYRP: 10.0000 mL | ORAL_SOLUTION | Freq: Three times a day (TID) | ORAL | 0 refills | Status: DC | PRN

## 2021-07-20 MED ORDER — ALBUTEROL SULFATE HFA 108 (90 BASE) MCG/ACT IN AERS: 2.0000 | INHALATION_SPRAY | Freq: Four times a day (QID) | RESPIRATORY_TRACT | 3 refills | Status: AC | PRN

## 2021-07-20 NOTE — Progress Notes (Signed)
Virtual Visit via Video   I connected with patient on 07/20/21 at 11:00 AM EST by a video enabled telemedicine application and verified that I am speaking with the correct person using two identifiers.  Location patient: Home Location provider: [facility] Persons participating in the virtual visit: Patient, Provider, [place] Billings C)  I discussed the limitations of evaluation and management by telemedicine and the availability of in person appointments. The patient expressed understanding and agreed to proceed.  Subjective:   HPI:   Cough- pt reports ongoing chest congestion and cough.  Took multiple home COVID tests and they were all negative.  Pt reports she doesn't necessarily feel bad.  No fever.  Cough is worse w/ lying down.  Sxs seem to pick up in the evening after dinner.  Cough is occasionally a 'rattle' but not productive.  Some relief w/ albuterol.  Denies SOB, chest tightness.  Denies GERD.  ROS:   See pertinent positives and negatives per HPI.  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   Allergic rhinitis 10/31/2014   Tobacco use disorder 09/06/2014   Hypokalemia 04/08/2014   Leukemoid reaction 04/08/2014   LBP (low back pain) 11/18/2013   HTN (hypertension) 04/20/2013   Morbid obesity ([place]) 04/20/2013   Depression with anxiety 04/20/2013   OSA on CPAP 04/20/2013   Eczema of hand 04/20/2013    Social History   Tobacco Use   Smoking status: Every Day     Packs/day: 0.75    Years: 20.00    Pack years: 15.00    Types: Cigarettes   Smokeless tobacco: Never  Substance Use Topics   Alcohol use: Not Currently    Comment: rare    Current Outpatient Medications:    albuterol (VENTOLIN HFA) 108 (90 Base) MCG/ACT inhaler, Inhale 2 puffs into the lungs every 6 (six) hours as needed for wheezing or shortness of breath., Disp: 18 g, Rfl: 3   ALPRAZolam (XANAX) 0.5 MG tablet, TAKE 1 TABLET BY MOUTH TWICE A DAY AS NEEDED FOR ANXIETY, Disp: 60 tablet, Rfl: 1   fenofibrate 160 MG tablet, TAKE ONE TABLET BY MOUTH ONE TIME DAILY , Disp: 90 tablet, Rfl: 0   ibuprofen (ADVIL,MOTRIN) 200 MG tablet, Take 400 mg by mouth every 6 (six) hours as needed for moderate pain., Disp: , Rfl:    levothyroxine (SYNTHROID, LEVOTHROID) 50 MCG tablet, Take 1 tablet (50 mcg total) by mouth daily., Disp: 90 tablet, Rfl: 3   loratadine (CLARITIN) 10 MG tablet, Take 1 tablet (10 mg total) by mouth daily., Disp: 30 tablet, Rfl: 0   meclizine (ANTIVERT) 25 MG tablet, Take 1 tablet (25 mg total) 3 (three) times daily as needed by mouth for dizziness., Disp: 30 tablet, Rfl: 0   spironolactone (ALDACTONE) 100 MG tablet, Take 1 tablet (100 mg total) by mouth 2 (two) times daily., Disp: 180 tablet, Rfl: 2   WEGOVY 0.25 MG/0.5ML SOAJ, INJECT 0.5ML UNDER THE SKIN ONCE WEEKLY FOR 28 DAYS, Disp: , Rfl:   Current Facility-Administered Medications:  0.9 %  sodium chloride infusion, 500 mL, Intravenous, Once, Armbruster, Carlota Raspberry, MD  Allergies  Allergen Reactions   Ace Inhibitors Cough    Cough + feels like she has a lump in her throat.     Objective:   There were no vitals taken for this visit. AAOx3, NAD NCAT, EOMI No obvious CN deficits Coloring WNL Pt is able to speak clearly, coherently without shortness of breath or increased work of breathing.  + barking cough Thought process is linear.  Mood is appropriate.   Assessment and Plan:   Post-viral cough- new.  Pt's sxs are  consistent w/ post viral cough due to airway inflammation.  Start Prednisone taper.  Continue albuterol.  And use codeine cough syrup for nighttime cough.  Pt expressed understanding and is in agreement w/ plan.    Annye Asa, MD 07/20/2021

## 2021-07-23 ENCOUNTER — Encounter: Payer: Self-pay | Admitting: Family Medicine

## 2021-07-24 MED ORDER — PREDNISONE 10 MG PO TABS: ORAL_TABLET | ORAL | 0 refills | Status: DC

## 2021-07-24 NOTE — Addendum Note (Signed)
Addended by: Midge Minium on: 07/24/2021 07:44 AM   Modules accepted: Orders

## 2021-08-08 DIAGNOSIS — H40013 Open angle with borderline findings, low risk, bilateral: Secondary | ICD-10-CM | POA: Diagnosis not present

## 2021-08-10 DIAGNOSIS — M170 Bilateral primary osteoarthritis of knee: Secondary | ICD-10-CM | POA: Diagnosis not present

## 2021-08-17 ENCOUNTER — Other Ambulatory Visit: Payer: Self-pay | Admitting: Obstetrics and Gynecology

## 2021-08-17 DIAGNOSIS — Q519 Congenital malformation of uterus and cervix, unspecified: Secondary | ICD-10-CM

## 2021-08-31 ENCOUNTER — Ambulatory Visit: Payer: BC Managed Care – PPO | Admitting: Family Medicine

## 2021-08-31 ENCOUNTER — Encounter: Payer: Self-pay | Admitting: Family Medicine

## 2021-08-31 VITALS — BP 126/80 | HR 96 | Temp 98.6°F | Resp 16 | Wt 205.2 lb

## 2021-08-31 DIAGNOSIS — Z23 Encounter for immunization: Secondary | ICD-10-CM

## 2021-08-31 DIAGNOSIS — E039 Hypothyroidism, unspecified: Secondary | ICD-10-CM

## 2021-08-31 DIAGNOSIS — E781 Pure hyperglyceridemia: Secondary | ICD-10-CM

## 2021-08-31 DIAGNOSIS — I10 Essential (primary) hypertension: Secondary | ICD-10-CM | POA: Diagnosis not present

## 2021-08-31 LAB — TSH: TSH: 2.17 u[IU]/mL (ref 0.35–5.50)

## 2021-08-31 LAB — CBC WITH DIFFERENTIAL/PLATELET
Basophils Absolute: 0.1 10*3/uL (ref 0.0–0.1)
Basophils Relative: 1 % (ref 0.0–3.0)
Eosinophils Absolute: 0.1 10*3/uL (ref 0.0–0.7)
Eosinophils Relative: 1.1 % (ref 0.0–5.0)
HCT: 46.7 % — ABNORMAL HIGH (ref 36.0–46.0)
Hemoglobin: 15.9 g/dL — ABNORMAL HIGH (ref 12.0–15.0)
Lymphocytes Relative: 26.7 % (ref 12.0–46.0)
Lymphs Abs: 2.9 10*3/uL (ref 0.7–4.0)
MCHC: 33.9 g/dL (ref 30.0–36.0)
MCV: 95.6 fl (ref 78.0–100.0)
Monocytes Absolute: 0.6 10*3/uL (ref 0.1–1.0)
Monocytes Relative: 5.2 % (ref 3.0–12.0)
Neutro Abs: 7.1 10*3/uL (ref 1.4–7.7)
Neutrophils Relative %: 66 % (ref 43.0–77.0)
Platelets: 245 10*3/uL (ref 150.0–400.0)
RBC: 4.89 Mil/uL (ref 3.87–5.11)
RDW: 12.8 % (ref 11.5–15.5)
WBC: 10.8 10*3/uL — ABNORMAL HIGH (ref 4.0–10.5)

## 2021-08-31 LAB — HEPATIC FUNCTION PANEL
ALT: 15 U/L (ref 0–35)
AST: 18 U/L (ref 0–37)
Albumin: 4.2 g/dL (ref 3.5–5.2)
Alkaline Phosphatase: 51 U/L (ref 39–117)
Bilirubin, Direct: 0.1 mg/dL (ref 0.0–0.3)
Total Bilirubin: 0.6 mg/dL (ref 0.2–1.2)
Total Protein: 6.5 g/dL (ref 6.0–8.3)

## 2021-08-31 LAB — LIPID PANEL
Cholesterol: 160 mg/dL (ref 0–200)
HDL: 34.3 mg/dL — ABNORMAL LOW (ref >39.00)
LDL Cholesterol: 99 mg/dL (ref 0–99)
NonHDL: 125.21
Total CHOL/HDL Ratio: 5
Triglycerides: 130 mg/dL (ref 0.0–149.0)
VLDL: 26 mg/dL (ref 0.0–40.0)

## 2021-08-31 LAB — BASIC METABOLIC PANEL
BUN: 12 mg/dL (ref 6–23)
CO2: 23 mEq/L (ref 19–32)
Calcium: 9.3 mg/dL (ref 8.4–10.5)
Chloride: 106 mEq/L (ref 96–112)
Creatinine, Ser: 0.77 mg/dL (ref 0.40–1.20)
GFR: 89.99 mL/min (ref >60.00)
Glucose, Bld: 73 mg/dL (ref 70–99)
Potassium: 4.2 mEq/L (ref 3.5–5.1)
Sodium: 137 mEq/L (ref 135–145)

## 2021-08-31 NOTE — Assessment & Plan Note (Signed)
Chronic problem.  Well controlled on Spironolactone.  Check labs due to diuretic.

## 2021-08-31 NOTE — Patient Instructions (Signed)
Schedule your complete physical in 6 months We'll notify you of your lab results and make any changes if needed Continue to work on healthy diet and regular exercise- you're doing great! Try and stop smoking!! Call with any questions or concerns Stay Safe!  Stay Healthy! Happy New Year!!!

## 2021-08-31 NOTE — Assessment & Plan Note (Signed)
Pt is down 20 lbs since July.  On Wegovy through work.  Encouraged healthy diet and regular exercise.  Check labs to risk stratify.  Will follow.

## 2021-08-31 NOTE — Assessment & Plan Note (Signed)
Chronic problem.  On Levothyroxine 30mcg daily and currently asymptomatic.  Check labs.  Adjust meds prn

## 2021-08-31 NOTE — Assessment & Plan Note (Signed)
Chronic problem.  Tolerating Fenofibrate w/o difficulty.  Check labs.  Adjust meds prn

## 2021-08-31 NOTE — Progress Notes (Signed)
° °  Subjective:    Patient ID: Jenna Pope, female    DOB: [birth year], 51 y.o.   MRN: [id number]  HPI HTN- chronic problem, on Spironolactone w/ good control.  No CP, SOB, HAs, visual changes, edema.  Hypothyroid- chronic problem, on Levothyroxine 24mcg daily.  No changes to skin/hair/nails.  Denies change in energy level  Hypertriglyceridemia- chronic problem, on Fenofibrate daily.  No abd pain, N/V.  Obesity- pt is down 20 lbs since July!  BMI 38.77.  Currently on Wegovy through onsite nurse at work.   Review of Systems For ROS see HPI   This visit occurred during the SARS-CoV-2 public health emergency.  Safety protocols were in place, including screening questions prior to the visit, additional usage of staff PPE, and extensive cleaning of exam room while observing appropriate contact time as indicated for disinfecting solutions.      Objective:   Physical Exam Vitals reviewed.  Constitutional:      General: She is not in acute distress.    Appearance: Normal appearance. She is well-developed. She is obese. She is not ill-appearing.  HENT:     Head: Normocephalic and atraumatic.  Eyes:     Conjunctiva/sclera: Conjunctivae normal.     Pupils: Pupils are equal, round, and reactive to light.  Neck:     Thyroid: No thyromegaly.  Cardiovascular:     Rate and Rhythm: Normal rate and regular rhythm.     Pulses: Normal pulses.     Heart sounds: Normal heart sounds. No murmur heard. Pulmonary:     Effort: Pulmonary effort is normal. No respiratory distress.     Breath sounds: Normal breath sounds.  Abdominal:     General: There is no distension.     Palpations: Abdomen is soft.     Tenderness: There is no abdominal tenderness.  Musculoskeletal:     Cervical back: Normal range of motion and neck supple.     Right lower leg: No edema.     Left lower leg: No edema.  Lymphadenopathy:     Cervical: No cervical adenopathy.  Skin:    General: Skin is warm and dry.  Neurological:      General: No focal deficit present.     Mental Status: She is alert and oriented to person, place, and time.  Psychiatric:        Mood and Affect: Mood normal.        Behavior: Behavior normal.          Assessment & Plan:

## 2021-09-12 DIAGNOSIS — M170 Bilateral primary osteoarthritis of knee: Secondary | ICD-10-CM | POA: Diagnosis not present

## 2021-09-19 DIAGNOSIS — M170 Bilateral primary osteoarthritis of knee: Secondary | ICD-10-CM | POA: Diagnosis not present

## 2021-09-21 ENCOUNTER — Encounter (HOSPITAL_BASED_OUTPATIENT_CLINIC_OR_DEPARTMENT_OTHER): Payer: Self-pay | Admitting: Obstetrics & Gynecology

## 2021-09-21 ENCOUNTER — Other Ambulatory Visit: Payer: Self-pay

## 2021-09-21 ENCOUNTER — Ambulatory Visit (INDEPENDENT_AMBULATORY_CARE_PROVIDER_SITE_OTHER): Payer: BC Managed Care – PPO | Admitting: Obstetrics & Gynecology

## 2021-09-21 VITALS — BP 124/99 | HR 86 | Ht 61.5 in | Wt 205.0 lb

## 2021-09-21 DIAGNOSIS — K909 Intestinal malabsorption, unspecified: Secondary | ICD-10-CM | POA: Diagnosis not present

## 2021-09-21 DIAGNOSIS — Z1231 Encounter for screening mammogram for malignant neoplasm of breast: Secondary | ICD-10-CM

## 2021-09-21 DIAGNOSIS — R232 Flushing: Secondary | ICD-10-CM | POA: Diagnosis not present

## 2021-09-21 DIAGNOSIS — R5383 Other fatigue: Secondary | ICD-10-CM | POA: Diagnosis not present

## 2021-09-21 DIAGNOSIS — N951 Menopausal and female climacteric states: Secondary | ICD-10-CM | POA: Diagnosis not present

## 2021-09-21 DIAGNOSIS — Z01419 Encounter for gynecological examination (general) (routine) without abnormal findings: Secondary | ICD-10-CM

## 2021-09-21 DIAGNOSIS — L680 Hirsutism: Secondary | ICD-10-CM

## 2021-09-21 MED ORDER — SPIRONOLACTONE 100 MG PO TABS: 100.0000 mg | ORAL_TABLET | Freq: Two times a day (BID) | ORAL | 4 refills | Status: DC

## 2021-09-21 MED ORDER — GABAPENTIN 100 MG PO CAPS: ORAL_CAPSULE | ORAL | 1 refills | Status: DC

## 2021-09-21 NOTE — Patient Instructions (Signed)
Dr. Crissie Reese [address], [place], [place] [address]

## 2021-09-21 NOTE — Progress Notes (Signed)
51 y.o. G96P1001 Married White or Caucasian female here for annual exam.  Has lost about 45 pounds since I saw her last, about 13 all total.  She is considering panniculectomy due to significant extra skin that is present.  Cycles are becoming irregular.  She is skipping months between cycles.  Flow is much heavier.  Has started having some hot flashes at night.  Has questions about treatment.  Does feel her energy is low.  Requests some testing.    On spironolactone for facial hair.  Doing well with this and needs RF.  Patient's last menstrual period was 09/15/2021.          Sexually active: No.  The current method of family planning is abstinence.    Exercising: Yes.     walking Smoker:  yes  Health Maintenance: Pap:  08/09/20 neg, neg HR HPV History of abnormal Pap:  no MMG:  08/09/20 neg Colonoscopy:  04/28/20 BMD:   not indicated Screening Labs: does with Dr. Birdie Riddle   reports that she has been smoking cigarettes. She has a 15.00 pack-year smoking history. She has never used smokeless tobacco. She reports that she does not currently use alcohol. She reports that she does not use drugs.  Past Medical History:  Diagnosis Date   Anxiety    Arthritis    knees    Carpal tunnel syndrome    Family history of anesthesia complication    had an aunt cardiac arrest during a knee surgery   Fibroids    GERD (gastroesophageal reflux disease)    Hypertension    Hypothyroidism    Obesity    Shortness of breath    with exertion    Sleep apnea    uses cpap since 2/13  setting at 12     Past Surgical History:  Procedure Laterality Date   BREAST REDUCTION SURGERY     CARPAL TUNNEL RELEASE  04/15/2012   Procedure: CARPAL TUNNEL RELEASE;  Surgeon: Wynonia Sours, MD;  Location: [facility];  Service: Orthopedics;  Laterality: Right;   COLONOSCOPY     LAPAROSCOPIC GASTRIC BANDING N/A 02/06/2015   Procedure: LAP BAND PLACEMENT;  Surgeon: Johnathan Hausen, MD;  Location: [facility];   Service: General;  Laterality: N/A;   merina birth control surgical removal   2015    OVARIAN CYST REMOVAL     UPPER GASTROINTESTINAL ENDOSCOPY     normal   WISDOM TOOTH EXTRACTION      Current Outpatient Medications  Medication Sig Dispense Refill   albuterol (VENTOLIN HFA) 108 (90 Base) MCG/ACT inhaler Inhale 2 puffs into the lungs every 6 (six) hours as needed for wheezing or shortness of breath. 18 g 3   ALPRAZolam (XANAX) 0.5 MG tablet TAKE 1 TABLET BY MOUTH TWICE A DAY AS NEEDED FOR ANXIETY 60 tablet 1   fenofibrate 160 MG tablet TAKE ONE TABLET BY MOUTH ONE TIME DAILY  90 tablet 0   ibuprofen (ADVIL,MOTRIN) 200 MG tablet Take 400 mg by mouth every 6 (six) hours as needed for moderate pain.     levothyroxine (SYNTHROID, LEVOTHROID) 50 MCG tablet Take 1 tablet (50 mcg total) by mouth daily. 90 tablet 3   loratadine (CLARITIN) 10 MG tablet Take 1 tablet (10 mg total) by mouth daily. 30 tablet 0   meclizine (ANTIVERT) 25 MG tablet Take 1 tablet (25 mg total) 3 (three) times daily as needed by mouth for dizziness. 30 tablet 0   Semaglutide-Weight Management 2.4 MG/0.75ML SOAJ  INJECT 0.5ML UNDER THE SKIN ONCE WEEKLY FOR 28 DAYS     spironolactone (ALDACTONE) 100 MG tablet Take 1 tablet (100 mg total) by mouth 2 (two) times daily. 180 tablet 2   Current Facility-Administered Medications  Medication Dose Route Frequency Provider Last Rate Last Admin   0.9 %  sodium chloride infusion  500 mL Intravenous Once Armbruster, Carlota Raspberry, MD        Family History  Problem Relation Age of Onset   Alcohol abuse Maternal Grandfather    Alcohol abuse Father    Hyperlipidemia Father    Heart disease Father    Stroke Father    Hypertension Father    Colon polyps Father    Arthritis Mother    Colon polyps Mother    Cancer Son    Arthritis Maternal Aunt    Cancer Maternal Uncle    Colon cancer Neg Hx    Esophageal cancer Neg Hx    Stomach cancer Neg Hx    Rectal cancer Neg Hx     Review of  Systems  All other systems reviewed and are negative.  Exam:   BP (!) 124/99    Pulse 86    Ht 5' 1.5" (1.562 m)    Wt 205 lb (93 kg)    LMP 09/15/2021    BMI 38.11 kg/m   Height: 5' 1.5" (156.2 cm)  General appearance: alert, cooperative and appears stated age Head: Normocephalic, without obvious abnormality, atraumatic Neck: no adenopathy, supple, symmetrical, trachea midline and thyroid normal to inspection and palpation Lungs: clear to auscultation bilaterally Breasts: normal appearance, no masses or tenderness Heart: regular rate and rhythm Abdomen: soft, non-tender; bowel sounds normal; no masses,  no organomegaly Extremities: extremities normal, atraumatic, no cyanosis or edema Skin: Skin color, texture, turgor normal. No rashes or lesions Lymph nodes: Cervical, supraclavicular, and axillary nodes normal. No abnormal inguinal nodes palpated Neurologic: Grossly normal   Pelvic: External genitalia:  no lesions              Urethra:  normal appearing urethra with no masses, tenderness or lesions              Bartholins and Skenes: normal                 Vagina: normal appearing vagina with normal color and no discharge, no lesions              Cervix: no lesions              Pap taken: No. Bimanual Exam:  Uterus:  normal size, contour, position, consistency, mobility, non-tender              Adnexa: normal adnexa and no mass, fullness, tenderness               Rectovaginal: Confirms               Anus:  normal sphincter tone, no lesions  Chaperone, Tonya, CMA, was present for exam.  Assessment/Plan: 1. Well woman exam with routine gynecological exam - pap negative with neg HR HPV 08/09/2020 - MMG 07/2020.  Pt aware due.  Order will be placed. - colonoscopy 04/2020 - plan BMD closer to age 68 - vaccines reviewed/updated - lab work done with Dr. Birdie Riddle earlier this month  2. Encounter for screening mammogram for malignant neoplasm of breast - MM 3D SCREEN BREAST  BILATERAL; Future  3. Perimenopausal symptom - Follicle stimulating hormone  4. Hot flashes -  treatment options discussed.  I would like to start with non hormonal options with her first - rx for gabapentin (NEURONTIN) 100 MG capsule; Take 1 capsule nightly x 3 nights.  Then can increase to 2 capsules nightly x 3 nights.  Then increase to 3 capsules nightly.  Dispense: 60 capsule; Refill: 1  5. Low energy - B12  6. Hirsutism - spironolactone (ALDACTONE) 100 MG tablet; Take 1 tablet (100 mg total) by mouth 2 (two) times daily.  Dispense: 180 tablet; Refill: 4 - BMD done 08/31/21 with Dr. Birdie Riddle

## 2021-09-22 LAB — VITAMIN B12: Vitamin B-12: 338 pg/mL (ref 232–1245)

## 2021-09-26 ENCOUNTER — Encounter (HOSPITAL_BASED_OUTPATIENT_CLINIC_OR_DEPARTMENT_OTHER): Payer: Self-pay | Admitting: Obstetrics & Gynecology

## 2021-09-26 DIAGNOSIS — M170 Bilateral primary osteoarthritis of knee: Secondary | ICD-10-CM | POA: Diagnosis not present

## 2021-09-27 LAB — FOLLICLE STIMULATING HORMONE: FSH: 29.7 m[IU]/mL

## 2021-09-27 LAB — SPECIMEN STATUS REPORT

## 2021-10-01 ENCOUNTER — Other Ambulatory Visit (HOSPITAL_BASED_OUTPATIENT_CLINIC_OR_DEPARTMENT_OTHER): Payer: Self-pay

## 2021-10-01 DIAGNOSIS — R232 Flushing: Secondary | ICD-10-CM

## 2021-10-01 MED ORDER — ESTRADIOL 0.5 MG PO TABS: 0.5000 mg | ORAL_TABLET | Freq: Every day | ORAL | 3 refills | Status: DC

## 2021-10-02 ENCOUNTER — Encounter (HOSPITAL_BASED_OUTPATIENT_CLINIC_OR_DEPARTMENT_OTHER): Payer: Self-pay | Admitting: Radiology

## 2021-10-02 ENCOUNTER — Ambulatory Visit (HOSPITAL_BASED_OUTPATIENT_CLINIC_OR_DEPARTMENT_OTHER)
Admission: RE | Admit: 2021-10-02 | Discharge: 2021-10-02 | Disposition: A | Discharge status: Home / Self Care | Payer: BC Managed Care – PPO | Source: Ambulatory Visit | Attending: Obstetrics & Gynecology | Admitting: Obstetrics & Gynecology

## 2021-10-02 ENCOUNTER — Other Ambulatory Visit: Payer: Self-pay

## 2021-10-02 DIAGNOSIS — Z1231 Encounter for screening mammogram for malignant neoplasm of breast: Secondary | ICD-10-CM | POA: Diagnosis not present

## 2021-10-29 ENCOUNTER — Encounter (HOSPITAL_BASED_OUTPATIENT_CLINIC_OR_DEPARTMENT_OTHER): Payer: Self-pay | Admitting: Obstetrics & Gynecology

## 2021-10-29 DIAGNOSIS — F17200 Nicotine dependence, unspecified, uncomplicated: Secondary | ICD-10-CM

## 2021-10-29 DIAGNOSIS — R634 Abnormal weight loss: Secondary | ICD-10-CM

## 2021-10-31 MED ORDER — BUPROPION HCL ER (SR) 150 MG PO TB12: ORAL_TABLET | ORAL | 3 refills | Status: DC

## 2021-11-13 ENCOUNTER — Telehealth (INDEPENDENT_AMBULATORY_CARE_PROVIDER_SITE_OTHER): Payer: BC Managed Care – PPO | Admitting: Obstetrics & Gynecology

## 2021-11-13 ENCOUNTER — Encounter (HOSPITAL_BASED_OUTPATIENT_CLINIC_OR_DEPARTMENT_OTHER): Payer: Self-pay | Admitting: Obstetrics & Gynecology

## 2021-11-13 DIAGNOSIS — N951 Menopausal and female climacteric states: Secondary | ICD-10-CM

## 2021-11-13 DIAGNOSIS — L309 Dermatitis, unspecified: Secondary | ICD-10-CM | POA: Diagnosis not present

## 2021-11-13 MED ORDER — PROGESTERONE 200 MG PO CAPS: ORAL_CAPSULE | ORAL | 2 refills | Status: DC

## 2021-11-13 NOTE — Progress Notes (Signed)
Virtual Visit via Video Note ? ?I connected with Jenna Pope on 11/13/21 at  8:15 AM EDT by a video enabled telemedicine application and verified that I am speaking with the correct person using two identifiers. ? ?Location: ?Patient: home ?Provider: office ?  ?I discussed the limitations of evaluation and management by telemedicine and the availability of in person appointments. The patient expressed understanding and agreed to proceed. ? ?History of Present Illness: ?51 yo G1P1 MWF who has recently started HRT.  She is feeling much better with the 0.'5mg'$  estradiol tablets.  Having less hot flashes (about 50% less).  Still does get hot and has some sweating in her scalp.  Sleep is ok.  Uses a weighted blanket but sometimes still get hot.  Overall, feels what she has is tolerable.  Has noticed some increased eczema.  Going to see dermatologist for this.  Advised she consider checking with pharmacy about fillers in the estradiol tablet as this is her only recent change.  We discussed using prometrium now for endometrial protection.  Dosing and timing discussed.  Will sent new rx to pharmacy.   ?  ?Observations/Objective: ?WNWD WF, NAD ? ?Assessment and Plan: ?51 yo G1P1 with vasomotor symptoms and newly on HRT ?1. Vasomotor symptoms due to menopause ?- pt will monitor and give update in 3-4 weeks about estradiol dosage.  Will need RF in June.  Also will start progesterone.  Pt aware to let me know about any bleeding ?- progesterone (PROMETRIUM) 200 MG capsule; Take 1 capsule nightly days 1-15 each month  Dispense: 45 capsule; Refill: 2 ? ?2. Eczema, unspecified type ?- she is going to check with pharmacy about possible fillers causing issues and follow up with dermatology ? ? ?Follow Up Instructions: ?I discussed the assessment and treatment plan with the patient. The patient was provided an opportunity to ask questions and all were answered. The patient agreed with the plan and demonstrated an understanding of the  instructions. ?  ?The patient was advised to call back or seek an in-person evaluation if the symptoms worsen or if the condition fails to improve as anticipated. ? ?I provided 22 minutes of non-face-to-face time during this encounter. ? ? ?Megan Salon, MD ? ?

## 2022-01-01 ENCOUNTER — Encounter: Payer: Self-pay | Admitting: Family Medicine

## 2022-01-02 ENCOUNTER — Ambulatory Visit (INDEPENDENT_AMBULATORY_CARE_PROVIDER_SITE_OTHER): Payer: BC Managed Care – PPO | Admitting: Family Medicine

## 2022-01-02 ENCOUNTER — Encounter: Payer: Self-pay | Admitting: Family Medicine

## 2022-01-02 VITALS — BP 132/82 | HR 90 | Temp 97.7°F | Resp 16 | Ht 61.5 in | Wt 204.2 lb

## 2022-01-02 DIAGNOSIS — L309 Dermatitis, unspecified: Secondary | ICD-10-CM

## 2022-01-02 MED ORDER — TRIAMCINOLONE ACETONIDE 0.1 % EX OINT: 1.0000 "application " | TOPICAL_OINTMENT | Freq: Two times a day (BID) | CUTANEOUS | 1 refills | Status: DC

## 2022-01-02 NOTE — Progress Notes (Signed)
   Subjective:    Patient ID: Jenna Pope, female    DOB: Dec 21, 1970, 51 y.o.   MRN: [id number]  HPI Eczema- pt reports she previously was treated w/ steroid creams and then switched to Vanicream OTC.  Sxs had resolved x7 years before reappearing a few weeks ago.  Sxs are most severe on R palm, L foot, some sxs on L palm and R foot.  Has been using Vanicream w/o relief.  Hands are very itchy, cracking, painful.  Sxs worsened when she returned to the office and had to use their hand soap.   Review of Systems For ROS see HPI     Objective:   Physical Exam Vitals reviewed.  Constitutional:      General: She is not in acute distress.    Appearance: Normal appearance. She is not ill-appearing.  HENT:     Head: Normocephalic and atraumatic.  Skin:    General: Skin is warm and dry.     Comments: R palmar surface dry, cracked, peeling w/o evidence of superimposed infxn L palm w/ dry, cracked skin but much smaller surfac area Plantar surface of L foot dry, cracked, peeling w/o evidence of secondary infxn  Neurological:     General: No focal deficit present.     Mental Status: She is alert and oriented to person, place, and time.  Psychiatric:        Mood and Affect: Mood normal.        Behavior: Behavior normal.          Assessment & Plan:

## 2022-01-02 NOTE — Assessment & Plan Note (Signed)
Deteriorated.  R palm looks incredibly painful as does L foot.  Thankfully L palm and R foot are much less severe.  Encouraged her to take her own soap to work.  Start topical Triamcinolone ointment BID and encouraged her to avoid water such as washing dishes.  If no improvement, will refer to Derm.  Pt expressed understanding and is in agreement w/ plan.

## 2022-01-02 NOTE — Patient Instructions (Addendum)
Follow up as needed or as scheduled START the Triamcinolone ointment twice daily If possible, try and wear cotton gloves over the ointment Make sure you take a daily allergy medication to help w/ the itching (Claritin or Zyrtec) Call with any questions or concerns Hang in there!!!

## 2022-01-10 DIAGNOSIS — E039 Hypothyroidism, unspecified: Secondary | ICD-10-CM | POA: Insufficient documentation

## 2022-01-24 ENCOUNTER — Other Ambulatory Visit (HOSPITAL_BASED_OUTPATIENT_CLINIC_OR_DEPARTMENT_OTHER): Payer: Self-pay | Admitting: Obstetrics & Gynecology

## 2022-01-24 DIAGNOSIS — R232 Flushing: Secondary | ICD-10-CM

## 2022-01-25 ENCOUNTER — Other Ambulatory Visit (HOSPITAL_BASED_OUTPATIENT_CLINIC_OR_DEPARTMENT_OTHER): Payer: Self-pay | Admitting: Obstetrics & Gynecology

## 2022-01-25 DIAGNOSIS — N951 Menopausal and female climacteric states: Secondary | ICD-10-CM

## 2022-02-11 DIAGNOSIS — L301 Dyshidrosis [pompholyx]: Secondary | ICD-10-CM | POA: Diagnosis not present

## 2022-02-17 ENCOUNTER — Encounter: Payer: Self-pay | Admitting: Plastic Surgery

## 2022-02-17 NOTE — Telephone Encounter (Signed)
Left message for patient to call the office for change in her appointment.

## 2022-02-18 ENCOUNTER — Encounter (HOSPITAL_BASED_OUTPATIENT_CLINIC_OR_DEPARTMENT_OTHER): Payer: Self-pay | Admitting: Obstetrics & Gynecology

## 2022-02-18 ENCOUNTER — Telehealth (HOSPITAL_BASED_OUTPATIENT_CLINIC_OR_DEPARTMENT_OTHER): Payer: Self-pay | Admitting: Obstetrics & Gynecology

## 2022-02-18 DIAGNOSIS — L403 Pustulosis palmaris et plantaris: Secondary | ICD-10-CM | POA: Diagnosis not present

## 2022-02-18 NOTE — Telephone Encounter (Signed)
Pt sent mychart message stating she had three days of bleeding starting 7/6.  Bleeding has already stopped.  She started HRT in February.  This is first bleeding that has occurred.  She is taking progesterone days 1-15.  She is currently on the progesterone and the bleeding occurred while taking it.  Pt and I discussed it is common to have some bleeding with HRT.  As this has only happened once, I think it's ok to just keep watching and let me know if happens again.  Pt report hot flashes and menopausal symptoms are much better.  Questions answered.

## 2022-03-01 ENCOUNTER — Ambulatory Visit: Payer: BC Managed Care – PPO | Admitting: Family Medicine

## 2022-03-01 ENCOUNTER — Encounter: Payer: Self-pay | Admitting: Family Medicine

## 2022-03-01 VITALS — BP 128/82 | HR 84 | Temp 98.9°F | Resp 16 | Ht 61.0 in | Wt 198.2 lb

## 2022-03-01 DIAGNOSIS — Z23 Encounter for immunization: Secondary | ICD-10-CM | POA: Diagnosis not present

## 2022-03-01 DIAGNOSIS — E559 Vitamin D deficiency, unspecified: Secondary | ICD-10-CM | POA: Diagnosis not present

## 2022-03-01 DIAGNOSIS — Z0000 Encounter for general adult medical examination without abnormal findings: Secondary | ICD-10-CM

## 2022-03-01 LAB — LIPID PANEL
Cholesterol: 176 mg/dL (ref 0–200)
HDL: 30.5 mg/dL — ABNORMAL LOW (ref >39.00)
LDL Cholesterol: 122 mg/dL — ABNORMAL HIGH (ref 0–99)
NonHDL: 145.51
Total CHOL/HDL Ratio: 6
Triglycerides: 116 mg/dL (ref 0.0–149.0)
VLDL: 23.2 mg/dL (ref 0.0–40.0)

## 2022-03-01 LAB — CBC WITH DIFFERENTIAL/PLATELET
Basophils Absolute: 0 10*3/uL (ref 0.0–0.1)
Basophils Relative: 0.3 % (ref 0.0–3.0)
Eosinophils Absolute: 0.1 10*3/uL (ref 0.0–0.7)
Eosinophils Relative: 0.8 % (ref 0.0–5.0)
HCT: 48 % — ABNORMAL HIGH (ref 36.0–46.0)
Hemoglobin: 16.5 g/dL — ABNORMAL HIGH (ref 12.0–15.0)
Lymphocytes Relative: 18 % (ref 12.0–46.0)
Lymphs Abs: 2.5 10*3/uL (ref 0.7–4.0)
MCHC: 34.4 g/dL (ref 30.0–36.0)
MCV: 95 fl (ref 78.0–100.0)
Monocytes Absolute: 0.8 10*3/uL (ref 0.1–1.0)
Monocytes Relative: 6 % (ref 3.0–12.0)
Neutro Abs: 10.3 10*3/uL — ABNORMAL HIGH (ref 1.4–7.7)
Neutrophils Relative %: 74.9 % (ref 43.0–77.0)
Platelets: 282 10*3/uL (ref 150.0–400.0)
RBC: 5.05 Mil/uL (ref 3.87–5.11)
RDW: 12.5 % (ref 11.5–15.5)
WBC: 13.8 10*3/uL — ABNORMAL HIGH (ref 4.0–10.5)

## 2022-03-01 LAB — BASIC METABOLIC PANEL
BUN: 16 mg/dL (ref 6–23)
CO2: 25 mEq/L (ref 19–32)
Calcium: 10.3 mg/dL (ref 8.4–10.5)
Chloride: 104 mEq/L (ref 96–112)
Creatinine, Ser: 0.91 mg/dL (ref 0.40–1.20)
GFR: 73.39 mL/min (ref >60.00)
Glucose, Bld: 75 mg/dL (ref 70–99)
Potassium: 4.2 mEq/L (ref 3.5–5.1)
Sodium: 137 mEq/L (ref 135–145)

## 2022-03-01 LAB — HEPATIC FUNCTION PANEL
ALT: 15 U/L (ref 0–35)
AST: 21 U/L (ref 0–37)
Albumin: 4.6 g/dL (ref 3.5–5.2)
Alkaline Phosphatase: 53 U/L (ref 39–117)
Bilirubin, Direct: 0.1 mg/dL (ref 0.0–0.3)
Total Bilirubin: 0.6 mg/dL (ref 0.2–1.2)
Total Protein: 7.5 g/dL (ref 6.0–8.3)

## 2022-03-01 LAB — HEMOGLOBIN A1C: Hgb A1c MFr Bld: 5 % (ref 4.6–6.5)

## 2022-03-01 LAB — TSH: TSH: 2.75 u[IU]/mL (ref 0.35–5.50)

## 2022-03-01 LAB — VITAMIN D 25 HYDROXY (VIT D DEFICIENCY, FRACTURES): VITD: 35.03 ng/mL (ref 30.00–100.00)

## 2022-03-01 NOTE — Assessment & Plan Note (Signed)
Ongoing issue for pt.  Currently on Wegovy and is losing weight.  Encouraged low carb diet and regular exercise.  Check labs to risk stratify.  Will follow.

## 2022-03-01 NOTE — Assessment & Plan Note (Signed)
Check labs and replete prn. 

## 2022-03-01 NOTE — Patient Instructions (Signed)
Follow up in 6 months to recheck BP and cholesterol We'll notify you of your lab results and make any changes if needed Continue to work on healthy diet and regular exercise- you're doing it! Call with any questions or concerns Stay Safe!  Stay Healthy! Enjoy the rest of your summer!!!

## 2022-03-01 NOTE — Progress Notes (Signed)
   Subjective:    Patient ID: Jenna Pope, female    DOB: 1970/12/04, 51 y.o.   MRN: [id number]  HPI CPE-UTD on mammo, colonoscopy, pap, Tdap.  Due for 2nd Shingrix  Patient Care Team    Relationship Specialty Notifications Start End  Midge Minium, MD PCP - General Family Medicine  04/19/13   Maisie Fus, MD Consulting Physician Obstetrics and Gynecology  12/22/15     Health Maintenance  Topic Date Due  . INFLUENZA VACCINE  03/12/2022  . MAMMOGRAM  10/02/2022  . COLONOSCOPY (Pts 45-28yr Insurance coverage will need to be confirmed)  04/29/2023  . PAP SMEAR-Modifier  08/10/2023  . TETANUS/TDAP  09/01/2031  . Zoster Vaccines- Shingrix  Completed  . HPV VACCINES  Aged Out  . COVID-19 Vaccine  Discontinued  . Hepatitis C Screening  Discontinued  . HIV Screening  Discontinued      Review of Systems Patient reports no vision/ hearing changes, adenopathy,fever, weight change,  persistant/recurrent hoarseness , swallowing issues, chest pain, palpitations, edema, persistant/recurrent cough, hemoptysis, dyspnea (rest/exertional/paroxysmal nocturnal), gastrointestinal bleeding (melena, rectal bleeding), abdominal pain, significant heartburn, bowel changes, GU symptoms (dysuria, hematuria, incontinence), Gyn symptoms (abnormal  bleeding, pain),  syncope, focal weakness, memory loss, numbness & tingling, skin/hair/nail changes, abnormal bruising or bleeding, anxiety, or depression.     Objective:   Physical Exam General Appearance:    Alert, cooperative, no distress, appears stated age, obese  Head:    Normocephalic, without obvious abnormality, atraumatic  Eyes:    PERRL, conjunctiva/corneas clear, EOM's intact both eyes  Ears:    Normal TM's and external ear canals, both ears  Nose:   Nares normal, septum midline, mucosa normal, no drainage    or sinus tenderness  Throat:   Lips, mucosa, and tongue normal; teeth and gums normal  Neck:   Supple, symmetrical, trachea midline, no  adenopathy;    Thyroid: no enlargement/tenderness/nodules  Back:     Symmetric, no curvature, ROM normal, no CVA tenderness  Lungs:     Clear to auscultation bilaterally, respirations unlabored  Chest Wall:    No tenderness or deformity   Heart:    Regular rate and rhythm, S1 and S2 normal, no murmur, rub   or gallop  Breast Exam:    Deferred to GYN  Abdomen:     Soft, non-tender, bowel sounds active all four quadrants,    no masses, no organomegaly  Genitalia:    Deferred to GYN  Rectal:    Extremities:   Extremities normal, atraumatic, no cyanosis or edema  Pulses:   2+ and symmetric all extremities  Skin:   Skin color, texture, turgor normal, no rashes or lesions  Lymph nodes:   Cervical, supraclavicular, and axillary nodes normal  Neurologic:   CNII-XII intact, normal strength, sensation and reflexes    throughout          Assessment & Plan:

## 2022-03-01 NOTE — Assessment & Plan Note (Signed)
Pt's PE WNL w/ exception of BMI.  UTD on mammo, colonoscopy, pap, Tdap.  2nd Shingrix given today.  Check labs.  Anticipatory guidance provided.

## 2022-03-04 NOTE — Progress Notes (Signed)
Pt seen results via my chart  

## 2022-03-06 ENCOUNTER — Institutional Professional Consult (permissible substitution): Payer: BC Managed Care – PPO | Admitting: Plastic Surgery

## 2022-03-18 ENCOUNTER — Institutional Professional Consult (permissible substitution): Payer: BC Managed Care – PPO | Admitting: Plastic Surgery

## 2022-03-19 ENCOUNTER — Ambulatory Visit (INDEPENDENT_AMBULATORY_CARE_PROVIDER_SITE_OTHER): Payer: Self-pay | Admitting: Plastic Surgery

## 2022-03-19 DIAGNOSIS — Z411 Encounter for cosmetic surgery: Secondary | ICD-10-CM

## 2022-03-19 NOTE — Progress Notes (Signed)
Botulinum Toxin  Procedure: Cosmetic botulinum toxin  Pre-operative Diagnosis: Dynamic rhytides and midface volume loss  Post-operative Diagnosis: Same  Complications:  None  Brief history: The patient desires botulinum toxin injection of her forehead. I discussed with the patient this proposed procedure of botulinum toxin injections, which is customized depending on the particular needs of the patient. It is performed on facial rhytids as a temporary correction. The alternatives were discussed with the patient. The risks were addressed including bleeding, scarring, infection, damage to deeper structures, asymmetry, and chronic pain, which may occur infrequently after a procedure. The individual's choice to undergo a surgical procedure is based on the comparison of risks to potential benefits. Other risks include unsatisfactory results, brow ptosis, eyelid ptosis, allergic reaction, temporary paralysis, which should go away with time, bruising, blurring disturbances and delayed healing. Botulinum toxin injections do not arrest the aging process or produce permanent tightening of the eyelid.  Operative intervention maybe necessary to maintain the results of a blepharoplasty or botulinum toxin. The patient understands and wishes to proceed.  Procedure: The area was prepped with alcohol and dried with a clean gauze. Using a clean technique, the botulinum toxin was diluted with 2.5 cc of preservative-free normal saline which was slowly injected with an 18 gauge needle in a tuberculin syringes.  A 32 gauge needles were then used to inject the botulinum toxin. This mixture allow for an aliquot of 4 units per 0.1 cc in each injection site.    Subsequently the mixture was injected into the following regions:  15 U of botox was injected into the glabella.   Botox LOT:  [id number]  EXP:  04/2024

## 2022-04-01 DIAGNOSIS — L403 Pustulosis palmaris et plantaris: Secondary | ICD-10-CM | POA: Diagnosis not present

## 2022-04-01 DIAGNOSIS — B078 Other viral warts: Secondary | ICD-10-CM | POA: Diagnosis not present

## 2022-04-16 ENCOUNTER — Encounter: Payer: Self-pay | Admitting: Family Medicine

## 2022-04-16 DIAGNOSIS — F418 Other specified anxiety disorders: Secondary | ICD-10-CM

## 2022-04-17 MED ORDER — ALPRAZOLAM 0.5 MG PO TABS: ORAL_TABLET | ORAL | 1 refills | Status: DC

## 2022-05-01 DIAGNOSIS — L403 Pustulosis palmaris et plantaris: Secondary | ICD-10-CM | POA: Diagnosis not present

## 2022-05-01 DIAGNOSIS — Z79899 Other long term (current) drug therapy: Secondary | ICD-10-CM | POA: Diagnosis not present

## 2022-05-03 DIAGNOSIS — L403 Pustulosis palmaris et plantaris: Secondary | ICD-10-CM | POA: Diagnosis not present

## 2022-06-03 ENCOUNTER — Encounter (HOSPITAL_BASED_OUTPATIENT_CLINIC_OR_DEPARTMENT_OTHER): Payer: Self-pay | Admitting: Obstetrics & Gynecology

## 2022-06-04 ENCOUNTER — Other Ambulatory Visit (HOSPITAL_BASED_OUTPATIENT_CLINIC_OR_DEPARTMENT_OTHER): Payer: Self-pay | Admitting: Obstetrics & Gynecology

## 2022-06-04 DIAGNOSIS — R634 Abnormal weight loss: Secondary | ICD-10-CM

## 2022-06-04 DIAGNOSIS — E65 Localized adiposity: Secondary | ICD-10-CM

## 2022-06-04 NOTE — Progress Notes (Signed)
Referral to Dr. Harlow Mares placed per pt request.

## 2022-06-06 DIAGNOSIS — Z79899 Other long term (current) drug therapy: Secondary | ICD-10-CM | POA: Diagnosis not present

## 2022-06-06 DIAGNOSIS — L403 Pustulosis palmaris et plantaris: Secondary | ICD-10-CM | POA: Diagnosis not present

## 2022-06-10 DIAGNOSIS — L403 Pustulosis palmaris et plantaris: Secondary | ICD-10-CM | POA: Diagnosis not present

## 2022-06-24 DIAGNOSIS — M170 Bilateral primary osteoarthritis of knee: Secondary | ICD-10-CM | POA: Diagnosis not present

## 2022-07-22 DIAGNOSIS — E65 Localized adiposity: Secondary | ICD-10-CM | POA: Diagnosis not present

## 2022-07-26 ENCOUNTER — Other Ambulatory Visit (HOSPITAL_BASED_OUTPATIENT_CLINIC_OR_DEPARTMENT_OTHER): Payer: Self-pay | Admitting: Obstetrics & Gynecology

## 2022-07-26 DIAGNOSIS — N951 Menopausal and female climacteric states: Secondary | ICD-10-CM

## 2022-07-30 ENCOUNTER — Ambulatory Visit: Payer: BC Managed Care – PPO | Admitting: Plastic Surgery

## 2022-08-02 DIAGNOSIS — L403 Pustulosis palmaris et plantaris: Secondary | ICD-10-CM | POA: Diagnosis not present

## 2022-08-06 ENCOUNTER — Encounter: Payer: Self-pay | Admitting: Plastic Surgery

## 2022-08-06 ENCOUNTER — Ambulatory Visit: Payer: BC Managed Care – PPO | Admitting: Plastic Surgery

## 2022-08-06 VITALS — BP 124/89 | HR 97 | Ht 61.0 in | Wt 200.0 lb

## 2022-08-06 DIAGNOSIS — Z9884 Bariatric surgery status: Secondary | ICD-10-CM | POA: Diagnosis not present

## 2022-08-06 DIAGNOSIS — M793 Panniculitis, unspecified: Secondary | ICD-10-CM

## 2022-08-06 DIAGNOSIS — Z87891 Personal history of nicotine dependence: Secondary | ICD-10-CM

## 2022-08-06 DIAGNOSIS — M542 Cervicalgia: Secondary | ICD-10-CM

## 2022-08-06 DIAGNOSIS — Z6839 Body mass index (BMI) 39.0-39.9, adult: Secondary | ICD-10-CM

## 2022-08-06 NOTE — Progress Notes (Signed)
   Subjective:    Patient ID: Jenna Pope, female    DOB: Oct 08, 1970, 51 y.o.   MRN: [id number]  The patient is a 51 year old female here for further evaluation of her abdomen.  She underwent a gastric lap band surgery in 2016 and did not see much improvement in her weight until 2020.  She then started weight watchers and lost 74 pounds.  She is now around 200 pounds and is 5 feet 1 inch tall and has a body mass index of 39.7 kg/m.  She quit smoking 3 months ago.  I am very happy for her.  She says that she can taste food differently and smell more effectively.      Review of Systems  Constitutional: Negative.   HENT: Negative.    Eyes: Negative.   Respiratory: Negative.  Negative for chest tightness and shortness of breath.   Cardiovascular: Negative.   Gastrointestinal: Negative.   Endocrine: Negative.   Genitourinary: Negative.   Musculoskeletal: Negative.        Objective:   Physical Exam Vitals reviewed.  Constitutional:      Appearance: Normal appearance.  Cardiovascular:     Rate and Rhythm: Normal rate.  Pulmonary:     Effort: Pulmonary effort is normal.  Musculoskeletal:        General: No swelling or deformity.  Skin:    Capillary Refill: Capillary refill takes less than 2 seconds.     Coloration: Skin is not jaundiced.     Findings: No bruising or lesion.  Neurological:     Mental Status: She is alert and oriented to person, place, and time.  Psychiatric:        Mood and Affect: Mood normal.        Behavior: Behavior normal.        Thought Content: Thought content normal.        Judgment: Judgment normal.           Assessment & Plan:     ICD-10-CM   1. Panniculitis  M79.3     2. Neck pain  M54.2       The patient has a new insurance it which is now [employer].  She would like [place] to submit to [facility] when the time comes.  She is going to continue with her diligence on weight loss and come and see [place] in 2 or 3 months.  She might be interested in pursuing  the panniculectomy and possibly abdominoplasty.

## 2022-08-09 ENCOUNTER — Encounter: Payer: Self-pay | Admitting: Family Medicine

## 2022-08-19 ENCOUNTER — Encounter: Payer: Self-pay | Admitting: *Deleted

## 2022-09-02 ENCOUNTER — Ambulatory Visit: Payer: No Typology Code available for payment source | Admitting: Family Medicine

## 2022-09-02 ENCOUNTER — Telehealth: Payer: Self-pay | Admitting: Family Medicine

## 2022-09-02 ENCOUNTER — Encounter: Payer: Self-pay | Admitting: Family Medicine

## 2022-09-02 VITALS — BP 116/74 | HR 86 | Temp 99.0°F | Ht 61.0 in | Wt 199.0 lb

## 2022-09-02 DIAGNOSIS — I10 Essential (primary) hypertension: Secondary | ICD-10-CM | POA: Diagnosis not present

## 2022-09-02 DIAGNOSIS — E039 Hypothyroidism, unspecified: Secondary | ICD-10-CM

## 2022-09-02 DIAGNOSIS — E781 Pure hyperglyceridemia: Secondary | ICD-10-CM | POA: Diagnosis not present

## 2022-09-02 DIAGNOSIS — F418 Other specified anxiety disorders: Secondary | ICD-10-CM

## 2022-09-02 LAB — HEPATIC FUNCTION PANEL
ALT: 17 U/L (ref 0–35)
AST: 19 U/L (ref 0–37)
Albumin: 4.2 g/dL (ref 3.5–5.2)
Alkaline Phosphatase: 66 U/L (ref 39–117)
Bilirubin, Direct: 0.1 mg/dL (ref 0.0–0.3)
Total Bilirubin: 0.7 mg/dL (ref 0.2–1.2)
Total Protein: 7.1 g/dL (ref 6.0–8.3)

## 2022-09-02 LAB — CBC WITH DIFFERENTIAL/PLATELET
Basophils Absolute: 0 10*3/uL (ref 0.0–0.1)
Basophils Relative: 0.5 % (ref 0.0–3.0)
Eosinophils Absolute: 0.2 10*3/uL (ref 0.0–0.7)
Eosinophils Relative: 1.8 % (ref 0.0–5.0)
HCT: 46.8 % — ABNORMAL HIGH (ref 36.0–46.0)
Hemoglobin: 16.1 g/dL — ABNORMAL HIGH (ref 12.0–15.0)
Lymphocytes Relative: 31.2 % (ref 12.0–46.0)
Lymphs Abs: 2.8 10*3/uL (ref 0.7–4.0)
MCHC: 34.5 g/dL (ref 30.0–36.0)
MCV: 91.9 fl (ref 78.0–100.0)
Monocytes Absolute: 0.7 10*3/uL (ref 0.1–1.0)
Monocytes Relative: 7.5 % (ref 3.0–12.0)
Neutro Abs: 5.3 10*3/uL (ref 1.4–7.7)
Neutrophils Relative %: 59 % (ref 43.0–77.0)
Platelets: 322 10*3/uL (ref 150.0–400.0)
RBC: 5.09 Mil/uL (ref 3.87–5.11)
RDW: 12.2 % (ref 11.5–15.5)
WBC: 9 10*3/uL (ref 4.0–10.5)

## 2022-09-02 LAB — LIPID PANEL
Cholesterol: 181 mg/dL (ref 0–200)
HDL: 41.4 mg/dL (ref >39.00)
LDL Cholesterol: 104 mg/dL — ABNORMAL HIGH (ref 0–99)
NonHDL: 140.03
Total CHOL/HDL Ratio: 4
Triglycerides: 179 mg/dL — ABNORMAL HIGH (ref 0.0–149.0)
VLDL: 35.8 mg/dL (ref 0.0–40.0)

## 2022-09-02 LAB — BASIC METABOLIC PANEL
BUN: 8 mg/dL (ref 6–23)
CO2: 26 mEq/L (ref 19–32)
Calcium: 9.9 mg/dL (ref 8.4–10.5)
Chloride: 103 mEq/L (ref 96–112)
Creatinine, Ser: 0.81 mg/dL (ref 0.40–1.20)
GFR: 84.09 mL/min (ref >60.00)
Glucose, Bld: 84 mg/dL (ref 70–99)
Potassium: 4.5 mEq/L (ref 3.5–5.1)
Sodium: 137 mEq/L (ref 135–145)

## 2022-09-02 LAB — TSH: TSH: 2.69 u[IU]/mL (ref 0.35–5.50)

## 2022-09-02 MED ORDER — FENOFIBRATE 160 MG PO TABS: 160.0000 mg | ORAL_TABLET | Freq: Every day | ORAL | 1 refills | Status: DC

## 2022-09-02 MED ORDER — SEMAGLUTIDE-WEIGHT MANAGEMENT 2.4 MG/0.75ML ~~LOC~~ SOAJ: SUBCUTANEOUS | 3 refills | Status: DC

## 2022-09-02 MED ORDER — ALPRAZOLAM 0.5 MG PO TABS: ORAL_TABLET | ORAL | 1 refills | Status: DC

## 2022-09-02 MED ORDER — SEMAGLUTIDE-WEIGHT MANAGEMENT 2.4 MG/0.75ML ~~LOC~~ SOAJ: 2.4000 mg | SUBCUTANEOUS | 3 refills | Status: DC

## 2022-09-02 NOTE — Telephone Encounter (Signed)
[facility] called stating he can't fill [facility] script 2.'4mg'$  pre-filled pen dispenses 1.'75mg'$  not 1.'5mg'$ 

## 2022-09-02 NOTE — Telephone Encounter (Signed)
In pt sig it states that pt is to inj 0.5 mL instead of dosage for 2.4 mg is 0.75 mL.

## 2022-09-02 NOTE — Assessment & Plan Note (Signed)
Ongoing issue for pt.  Currently on Fenofibrate '160mg'$  daily w/o difficulty.  Check labs.  Adjust meds prn

## 2022-09-02 NOTE — Assessment & Plan Note (Signed)
Ongoing issue for pt.  She was started on Wegovy by her clinic at work but they have since closed.  She needs me to assume the prescribing role.  Refill sent to pharmacy.  Encouraged low carb diet and regular physical activity.  Check labs to risk stratify.  Will follow.

## 2022-09-02 NOTE — Telephone Encounter (Signed)
Please advise on HLD and Xanax rx.

## 2022-09-02 NOTE — Progress Notes (Signed)
   Subjective:    Patient ID: Jenna Pope, female    DOB: 1971-01-13, 52 y.o.   MRN: [id number]  HPI HTN- chronic problem, on Spironolactone '100mg'$  BID w/ good control.  Pt quit smoking in September!!!  No CP, SOB, HA's, visual changes, edema.  Hypertriglyceridemia- chronic problem, on Fenofibrate '160mg'$  daily.  No abd pain, N/V.  Hypothyroid- pt stopped taking her Levothyroxine b/c her labs were 'normal'.  Previously on 37mg daily.  Morbid obesity- pt's BMI of 37.6 qualifies as morbidly obese due to BP and cholesterol.  Some walking at work.  Was started on Wegovy by work clinic.  They have since closed and needs me to refill.   Review of Systems For ROS see HPI     Objective:   Physical Exam Vitals reviewed.  Constitutional:      General: She is not in acute distress.    Appearance: Normal appearance. She is well-developed. She is obese. She is not ill-appearing.  HENT:     Head: Normocephalic and atraumatic.  Eyes:     Conjunctiva/sclera: Conjunctivae normal.     Pupils: Pupils are equal, round, and reactive to light.  Neck:     Thyroid: No thyromegaly.  Cardiovascular:     Rate and Rhythm: Normal rate and regular rhythm.     Pulses: Normal pulses.     Heart sounds: Normal heart sounds. No murmur heard. Pulmonary:     Effort: Pulmonary effort is normal. No respiratory distress.     Breath sounds: Normal breath sounds.  Abdominal:     General: There is no distension.     Palpations: Abdomen is soft.     Tenderness: There is no abdominal tenderness.  Musculoskeletal:     Cervical back: Normal range of motion and neck supple.     Right lower leg: No edema.     Left lower leg: No edema.  Lymphadenopathy:     Cervical: No cervical adenopathy.  Skin:    General: Skin is warm and dry.  Neurological:     Mental Status: She is alert and oriented to person, place, and time.  Psychiatric:        Behavior: Behavior normal.           Assessment & Plan:

## 2022-09-02 NOTE — Assessment & Plan Note (Signed)
Chronic problem.  Pt stopped her medication b/c her labs have been normal.  Her normal labs were likely a result of the medication but we will check labs and determine if a restart is needed.

## 2022-09-02 NOTE — Telephone Encounter (Signed)
Please advise 

## 2022-09-02 NOTE — Telephone Encounter (Signed)
Is he thinking about Ozempic doses vs Wegovy dose?  B/c the [facility] dose is 2.'4mg'$  weekly

## 2022-09-02 NOTE — Assessment & Plan Note (Signed)
Chronic problem.  Currently well controlled on Spironolactone '100mg'$  BID.  Asymptomatic.  Check labs due to diuretic but no anticipated med changes.  Will follow.

## 2022-09-02 NOTE — Patient Instructions (Signed)
Schedule your complete physical in 6 months We'll notify you of your lab results and make any changes if needed Continue to work on healthy diet and regular exercise- in addition to the [facility].  You can do it!! Call with any questions or concerns Stay Safe!  Stay Healthy! Happy New Year!!!

## 2022-09-02 NOTE — Telephone Encounter (Signed)
Prescription changed.  Thank you!

## 2022-09-03 ENCOUNTER — Telehealth: Payer: Self-pay | Admitting: Family Medicine

## 2022-09-03 ENCOUNTER — Telehealth: Payer: Self-pay

## 2022-09-03 NOTE — Telephone Encounter (Signed)
Informed pt of lab results  

## 2022-09-03 NOTE — Telephone Encounter (Signed)
Attempted to reach pt . VM is full and no answer in regards to lab results

## 2022-09-03 NOTE — Telephone Encounter (Signed)
-----  Message from Midge Minium, MD sent at 09/03/2022  7:50 AM EST ----- Labs look great!  No changes at this time

## 2022-09-03 NOTE — Telephone Encounter (Signed)
Patient was returning a phone from [facility]

## 2022-09-03 NOTE — Telephone Encounter (Signed)
error 

## 2022-10-04 ENCOUNTER — Encounter (HOSPITAL_BASED_OUTPATIENT_CLINIC_OR_DEPARTMENT_OTHER): Payer: Self-pay | Admitting: Obstetrics & Gynecology

## 2022-10-04 ENCOUNTER — Ambulatory Visit (HOSPITAL_BASED_OUTPATIENT_CLINIC_OR_DEPARTMENT_OTHER): Payer: No Typology Code available for payment source | Admitting: Obstetrics & Gynecology

## 2022-10-04 VITALS — BP 123/85 | HR 85 | Ht 61.0 in | Wt 206.2 lb

## 2022-10-04 DIAGNOSIS — R232 Flushing: Secondary | ICD-10-CM | POA: Diagnosis not present

## 2022-10-04 DIAGNOSIS — B372 Candidiasis of skin and nail: Secondary | ICD-10-CM

## 2022-10-04 DIAGNOSIS — L680 Hirsutism: Secondary | ICD-10-CM | POA: Diagnosis not present

## 2022-10-04 DIAGNOSIS — I10 Essential (primary) hypertension: Secondary | ICD-10-CM

## 2022-10-04 DIAGNOSIS — Z01419 Encounter for gynecological examination (general) (routine) without abnormal findings: Secondary | ICD-10-CM | POA: Diagnosis not present

## 2022-10-04 DIAGNOSIS — Z79890 Hormone replacement therapy (postmenopausal): Secondary | ICD-10-CM | POA: Diagnosis not present

## 2022-10-04 DIAGNOSIS — L409 Psoriasis, unspecified: Secondary | ICD-10-CM

## 2022-10-04 DIAGNOSIS — Z87891 Personal history of nicotine dependence: Secondary | ICD-10-CM

## 2022-10-04 DIAGNOSIS — Z1231 Encounter for screening mammogram for malignant neoplasm of breast: Secondary | ICD-10-CM

## 2022-10-04 MED ORDER — PROGESTERONE 200 MG PO CAPS: ORAL_CAPSULE | ORAL | 3 refills | Status: DC

## 2022-10-04 MED ORDER — SPIRONOLACTONE 100 MG PO TABS: 100.0000 mg | ORAL_TABLET | Freq: Two times a day (BID) | ORAL | 4 refills | Status: DC

## 2022-10-04 MED ORDER — ESTRADIOL 0.5 MG PO TABS: 0.5000 mg | ORAL_TABLET | Freq: Every day | ORAL | 3 refills | Status: DC

## 2022-10-04 NOTE — Patient Instructions (Signed)
Call [phone] to schedule an appointment at [facility].    Mammograms can also be self-scheduled online through [facility].

## 2022-10-04 NOTE — Progress Notes (Signed)
52 y.o. G5P1001 Married White or Caucasian female here for annual exam.  Doing well.  Denies vaginal bleeding.  Hot flashes are much better.  Desires to continue spironolactone as well.  This has helped with her blood pressure.    Having some issues with rash under pannus.  Sometimes there is odor with this.  Has about every two weeks.  Uses barrier cream.  No LMP recorded. Patient is perimenopausal.          Sexually active: Yes.    The current method of family planning is post menopausal status.    Smoker:  no  Health Maintenance: Pap:  08/09/2020 History of abnormal Pap:  no MMG:  10/02/2021 Colonoscopy:  04/2020, follow up 3 years BMD:   guidelines reviewed Screening Labs: 09/02/2022   reports that she quit smoking about 4 months ago. Her smoking use included cigarettes. She has a 15.00 pack-year smoking history. She has never used smokeless tobacco. She reports that she does not currently use alcohol. She reports that she does not use drugs.  Past Medical History:  Diagnosis Date   Anxiety    Arthritis    knees    Carpal tunnel syndrome    Family history of anesthesia complication    had an aunt cardiac arrest during a knee surgery   Fibroids    GERD (gastroesophageal reflux disease)    Hypertension    Hypothyroidism    Obesity    Shortness of breath    with exertion    Sleep apnea    uses cpap since 2/13  setting at 12     Past Surgical History:  Procedure Laterality Date   BREAST REDUCTION SURGERY     CARPAL TUNNEL RELEASE  04/15/2012   Procedure: CARPAL TUNNEL RELEASE;  Surgeon: Wynonia Sours, MD;  Location: [facility];  Service: Orthopedics;  Laterality: Right;   COLONOSCOPY     LAPAROSCOPIC GASTRIC BANDING N/A 02/06/2015   Procedure: LAP BAND PLACEMENT;  Surgeon: Johnathan Hausen, MD;  Location: [facility];  Service: General;  Laterality: N/A;   merina birth control surgical removal   2015    OVARIAN CYST REMOVAL     UPPER GASTROINTESTINAL ENDOSCOPY      normal   WISDOM TOOTH EXTRACTION      Current Outpatient Medications  Medication Sig Dispense Refill   albuterol (VENTOLIN HFA) 108 (90 Base) MCG/ACT inhaler Inhale 2 puffs into the lungs every 6 (six) hours as needed for wheezing or shortness of breath. 18 g 3   ALPRAZolam (XANAX) 0.5 MG tablet TAKE 1 TABLET BY MOUTH TWICE A DAY AS NEEDED FOR ANXIETY 60 tablet 1   augmented betamethasone dipropionate (DIPROLENE-AF) 0.05 % cream APPLY TO AFFECTED AREAS OF HANDS  and  FEET TWICE DAILY AS NEEDED (DON T APPLY TO FACE, GROIN, UNDERARMS)     calcipotriene (DOVONOX) 0.005 % cream APPLY TO AFFECTED AREAS OF HANDS  and  FEET TWICE DAILY AS NEEDED     celecoxib (CELEBREX) 200 MG capsule Take 200 mg by mouth daily.     Cholecalciferol (VITAMIN D-1000 MAX ST) 25 MCG (1000 UT) tablet Take by mouth.     fenofibrate 160 MG tablet Take 1 tablet (160 mg total) by mouth daily. 90 tablet 1   ibuprofen (ADVIL,MOTRIN) 200 MG tablet Take 400 mg by mouth every 6 (six) hours as needed for moderate pain.     OTEZLA 30 MG TABS Take 1 tablet by mouth 2 (two) times daily.  Semaglutide-Weight Management 2.4 MG/0.75ML SOAJ Inject 2.4 mg into the skin once a week. 3 mL 3   estradiol (ESTRACE) 0.5 MG tablet Take 1 tablet (0.5 mg total) by mouth daily. 90 tablet 3   progesterone (PROMETRIUM) 200 MG capsule Take 1 capsule nightly days 1-15 each month 45 capsule 3   spironolactone (ALDACTONE) 100 MG tablet Take 1 tablet (100 mg total) by mouth 2 (two) times daily. 180 tablet 4   Current Facility-Administered Medications  Medication Dose Route Frequency Provider Last Rate Last Admin   0.9 %  sodium chloride infusion  500 mL Intravenous Once Armbruster, Carlota Raspberry, MD        Family History  Problem Relation Age of Onset   Alcohol abuse Maternal Grandfather    Alcohol abuse Father    Hyperlipidemia Father    Heart disease Father    Stroke Father    Hypertension Father    Colon polyps Father    Arthritis Mother     Colon polyps Mother    Cancer Son    Arthritis Maternal Aunt    Cancer Maternal Uncle    Colon cancer Neg Hx    Esophageal cancer Neg Hx    Stomach cancer Neg Hx    Rectal cancer Neg Hx     ROS: Constitutional: negative Genitourinary:negative  Exam:   BP 123/85 (BP Location: Right Arm, Patient Position: Sitting, Cuff Size: Large)   Pulse 85   Ht '5\' 1"'$  (1.549 m) Comment: Reported  Wt 206 lb 3.2 oz (93.5 kg)   BMI 38.96 kg/m   Height: '5\' 1"'$  (154.9 cm) (Reported)  General appearance: alert, cooperative and appears stated age Head: Normocephalic, without obvious abnormality, atraumatic Neck: no adenopathy, supple, symmetrical, trachea midline and thyroid normal to inspection and palpation Lungs: clear to auscultation bilaterally Breasts: normal appearance, no masses or tenderness Heart: regular rate and rhythm Abdomen: soft, non-tender; bowel sounds normal; no masses,  no organomegaly Extremities: extremities normal, atraumatic, no cyanosis or edema Skin: Skin color, texture, turgor normal. Erythema with satellite lesions noted c/w skin candida Lymph nodes: Cervical, supraclavicular, and axillary nodes normal. No abnormal inguinal nodes palpated Neurologic: Grossly normal   Pelvic: External genitalia:  no lesions              Urethra:  normal appearing urethra with no masses, tenderness or lesions              Bartholins and Skenes: normal                 Vagina: normal appearing vagina with normal color and no discharge, no lesions              Cervix: no lesions              Pap taken: No. Bimanual Exam:  Uterus:  normal size, contour, position, consistency, mobility, non-tender              Adnexa: normal adnexa and no mass, fullness, tenderness               Rectovaginal: Confirms               Anus:  normal sphincter tone, no lesions  Chaperone, Octaviano Batty, CMA, was present for exam.  Assessment/Plan: 1. Well woman exam with routine gynecological exam - Pap smear  07/2020 - Mammogram 09/2021 - Colonoscopy 2021, follow up this year - Bone mineral density guidelines reviewed - lab work done in January - vaccines reviewed/updated  2. Hot flashes - much improved  3. Hormone replacement therapy - estradiol (ESTRACE) 0.5 MG tablet; Take 1 tablet (0.5 mg total) by mouth daily.  Dispense: 90 tablet; Refill: 3 - progesterone (PROMETRIUM) 200 MG capsule; Take 1 capsule nightly days 1-15 each month  Dispense: 45 capsule; Refill: 3  4. Hirsutism - spironolactone (ALDACTONE) 100 MG tablet; Take 1 tablet (100 mg total) by mouth 2 (two) times daily.  Dispense: 180 tablet; Refill: 4  5. Psoriasis - on treatment  6. Primary hypertension - on medication  7. History of smoking - stopped smoking 04/2202  8. Candidal skin infection - topical antifungals discussed.

## 2022-10-18 ENCOUNTER — Ambulatory Visit (HOSPITAL_BASED_OUTPATIENT_CLINIC_OR_DEPARTMENT_OTHER)
Admission: RE | Admit: 2022-10-18 | Discharge: 2022-10-18 | Disposition: A | Discharge status: Home / Self Care | Payer: No Typology Code available for payment source | Source: Ambulatory Visit | Attending: Obstetrics & Gynecology | Admitting: Obstetrics & Gynecology

## 2022-10-18 DIAGNOSIS — Z1231 Encounter for screening mammogram for malignant neoplasm of breast: Secondary | ICD-10-CM | POA: Diagnosis present

## 2022-11-08 ENCOUNTER — Ambulatory Visit: Payer: BC Managed Care – PPO | Admitting: Physician Assistant

## 2022-11-18 ENCOUNTER — Ambulatory Visit (INDEPENDENT_AMBULATORY_CARE_PROVIDER_SITE_OTHER): Payer: Self-pay | Admitting: Surgical

## 2022-11-18 ENCOUNTER — Encounter: Payer: No Typology Code available for payment source | Admitting: Surgical

## 2022-11-18 DIAGNOSIS — Z411 Encounter for cosmetic surgery: Secondary | ICD-10-CM

## 2022-11-18 NOTE — Progress Notes (Signed)
Botulinum Toxin Procedure Note  Procedure: Cosmetic botulinum toxin  Pre-operative Diagnosis: Dynamic rhytides  Post-operative Diagnosis: Same  Complications:  None  Brief history: The patient desires botulinum toxin injection.  She is aware of the risks including bleeding, damage to deeper structures, asymmetry, brow ptosis, eyelid ptosis, bruising. The patient understands and wishes to proceed.  Procedure: The area was prepped with alcohol and dried with a clean gauze.  Using a clean technique the botulinum toxin was diluted with 2.5 mL of bacteriostatic saline per 100 unit vial which resulted in 4 units per 0.1 mL.  Subsequently the mixture was injected in the glabellar, forehead area with preservation of the temporal branch to the lateral eyebrow. A total of 26 Units of botulinum toxin was used. The forehead and glabellar area was injected with care to inject intramuscular only while holding pressure on the supratrochlear vessels in each area during each injection on either side of the medial corrugators. The injection proceeded vertically superiorly to the medial 2/3 of the frontalis muscle and superior 2/3 of the lateral frontalis, again with preservation of the frontal branch.  16 units injected in the glabella 10 units in the forehead in a standard M pattern.  No complications were noted. Light pressure was held for 5 minutes. She was instructed explicitly in post-operative care.  Botox LOT:  [id number] EXP:  2026/03

## 2022-11-21 ENCOUNTER — Telehealth: Payer: Self-pay | Admitting: Pharmacy Technician

## 2022-11-21 ENCOUNTER — Other Ambulatory Visit (HOSPITAL_COMMUNITY): Payer: Self-pay

## 2022-11-21 NOTE — Telephone Encounter (Signed)
Patient Advocate Encounter   Received notification that prior authorization for [facility] 2.4MG /0.75ML auto-injectors is required.   PA submitted on 11/21/2022 Key Key: [employer] Electronic PA Form Status is pending

## 2022-11-26 NOTE — Telephone Encounter (Signed)
On the [facility] due to pt is still taking the [facility] and has had weight loss

## 2022-11-26 NOTE — Telephone Encounter (Signed)
Patient Advocate Encounter  Received a fax from [employer] regarding Prior Authorization for [facility] 2.4MG /0.75ML auto-injectors.   Authorization has been DENIED due to   Determination letter attached to patient chart

## 2022-11-26 NOTE — Telephone Encounter (Signed)
Provider looking for more details

## 2022-11-26 NOTE — Telephone Encounter (Signed)
Spoke to [place] at [employer] and they need office notes with start BMI and current BMI that was not sent at the time of PA . PA number is [phone] fax number [phone] sent everything pt is aware

## 2022-11-26 NOTE — Telephone Encounter (Signed)
I'm not sure what that even means b/c there is no documentation of her having a bad outcome or issues w/ [facility]

## 2022-11-26 NOTE — Telephone Encounter (Signed)
Please advise 

## 2022-12-04 NOTE — Telephone Encounter (Signed)
At this time, her insurance company is being very difficult and holding her to the letter of the law as far as the indication/continuation of medication.  Unfortunately if a patient doesn't lose a certain percentage of their body weight, they can refuse to pay as they deem it 'ineffective'.  She can certainly try and appeal the decision w/ her insurance but there is not much we can do at this time as we have sent all the documentation.

## 2022-12-04 NOTE — Telephone Encounter (Signed)
I have spoken to the pt and advised her that the insurance company can deny it due to their protocol . I advised her she can reach out to them if she chose to .

## 2022-12-04 NOTE — Telephone Encounter (Signed)
We received notification from [facility] that the pt Jenna Pope has been denied for the second time even though she has been on the medicine . Office notes and labs where faxed on 11/26/22 to [facility] . Please advise what would you like to do for pt ?

## 2022-12-12 ENCOUNTER — Encounter: Payer: Self-pay | Admitting: Family Medicine

## 2022-12-12 DIAGNOSIS — F418 Other specified anxiety disorders: Secondary | ICD-10-CM

## 2022-12-12 MED ORDER — ALPRAZOLAM 0.5 MG PO TABS: ORAL_TABLET | ORAL | 1 refills | Status: DC

## 2022-12-12 NOTE — Telephone Encounter (Signed)
Pt asking if there is something else she can take if can't get wegovy approved   Patient is requesting a refill of the following medications: Requested Prescriptions   Pending Prescriptions Disp Refills   ALPRAZolam (XANAX) 0.5 MG tablet 60 tablet 1    Sig: TAKE 1 TABLET BY MOUTH TWICE A DAY AS NEEDED FOR ANXIETY    Date of patient request: 12/12/22 Last office visit: 09/02/22 Date of last refill: 09/02/22 Last refill amount: 60

## 2022-12-28 ENCOUNTER — Other Ambulatory Visit (HOSPITAL_BASED_OUTPATIENT_CLINIC_OR_DEPARTMENT_OTHER): Payer: Self-pay | Admitting: Obstetrics & Gynecology

## 2022-12-28 DIAGNOSIS — Z79890 Hormone replacement therapy (postmenopausal): Secondary | ICD-10-CM

## 2023-01-18 ENCOUNTER — Other Ambulatory Visit (HOSPITAL_COMMUNITY): Payer: Self-pay | Admitting: Physician Assistant

## 2023-01-18 ENCOUNTER — Ambulatory Visit (HOSPITAL_COMMUNITY)
Admission: RE | Admit: 2023-01-18 | Discharge: 2023-01-18 | Disposition: A | Discharge status: Home / Self Care | Payer: No Typology Code available for payment source | Source: Ambulatory Visit | Attending: Physician Assistant | Admitting: Physician Assistant

## 2023-01-18 DIAGNOSIS — M79605 Pain in left leg: Secondary | ICD-10-CM | POA: Insufficient documentation

## 2023-01-18 NOTE — Progress Notes (Signed)
VASCULAR LAB    Left lower extremity venous duplex has been performed.  See CV proc for preliminary results.   Markeita Alicia, RVT 01/18/2023, 2:55 PM

## 2023-02-07 ENCOUNTER — Ambulatory Visit: Payer: BC Managed Care – PPO | Admitting: Plastic Surgery

## 2023-02-28 ENCOUNTER — Encounter: Payer: No Typology Code available for payment source | Admitting: Family Medicine

## 2023-03-13 ENCOUNTER — Other Ambulatory Visit (HOSPITAL_COMMUNITY): Payer: Self-pay

## 2023-03-13 MED ORDER — ZEPBOUND 2.5 MG/0.5ML ~~LOC~~ SOAJ
2.5000 mg | SUBCUTANEOUS | 0 refills | Status: DC
  Filled 2023-03-13: qty 2, 28d supply, fill #0

## 2023-03-27 ENCOUNTER — Encounter (INDEPENDENT_AMBULATORY_CARE_PROVIDER_SITE_OTHER): Payer: Self-pay

## 2023-03-28 ENCOUNTER — Other Ambulatory Visit: Payer: Self-pay | Admitting: Oncology

## 2023-03-28 DIAGNOSIS — Z006 Encounter for examination for normal comparison and control in clinical research program: Secondary | ICD-10-CM

## 2023-04-02 ENCOUNTER — Other Ambulatory Visit (HOSPITAL_COMMUNITY): Payer: Self-pay

## 2023-04-02 MED ORDER — ZEPBOUND 5 MG/0.5ML ~~LOC~~ SOAJ
5.0000 mg | SUBCUTANEOUS | 0 refills | Status: DC
  Filled 2023-04-02: qty 2, 28d supply, fill #0

## 2023-04-03 ENCOUNTER — Other Ambulatory Visit: Payer: Self-pay | Admitting: Family Medicine

## 2023-04-17 ENCOUNTER — Encounter: Payer: Self-pay | Admitting: Gastroenterology

## 2023-04-29 ENCOUNTER — Encounter: Payer: Self-pay | Admitting: Family Medicine

## 2023-04-29 ENCOUNTER — Ambulatory Visit (INDEPENDENT_AMBULATORY_CARE_PROVIDER_SITE_OTHER): Payer: No Typology Code available for payment source | Admitting: Family Medicine

## 2023-04-29 ENCOUNTER — Other Ambulatory Visit (HOSPITAL_COMMUNITY): Payer: Self-pay

## 2023-04-29 VITALS — BP 122/78 | HR 90 | Temp 97.4°F | Wt 231.4 lb

## 2023-04-29 DIAGNOSIS — Z23 Encounter for immunization: Secondary | ICD-10-CM

## 2023-04-29 DIAGNOSIS — E559 Vitamin D deficiency, unspecified: Secondary | ICD-10-CM

## 2023-04-29 DIAGNOSIS — Z0000 Encounter for general adult medical examination without abnormal findings: Secondary | ICD-10-CM

## 2023-04-29 LAB — LIPID PANEL
Cholesterol: 137 mg/dL (ref 0–200)
HDL: 39.4 mg/dL (ref >39.00)
LDL Cholesterol: 79 mg/dL (ref 0–99)
NonHDL: 97.6
Total CHOL/HDL Ratio: 3
Triglycerides: 92 mg/dL (ref 0.0–149.0)
VLDL: 18.4 mg/dL (ref 0.0–40.0)

## 2023-04-29 LAB — TSH: TSH: 2.98 u[IU]/mL (ref 0.35–5.50)

## 2023-04-29 LAB — CBC WITH DIFFERENTIAL/PLATELET
Basophils Absolute: 0 10*3/uL (ref 0.0–0.1)
Basophils Relative: 0.5 % (ref 0.0–3.0)
Eosinophils Absolute: 0.1 10*3/uL (ref 0.0–0.7)
Eosinophils Relative: 0.8 % (ref 0.0–5.0)
HCT: 46 % (ref 36.0–46.0)
Hemoglobin: 15.2 g/dL — ABNORMAL HIGH (ref 12.0–15.0)
Lymphocytes Relative: 31 % (ref 12.0–46.0)
Lymphs Abs: 2.7 10*3/uL (ref 0.7–4.0)
MCHC: 33.1 g/dL (ref 30.0–36.0)
MCV: 92.6 fl (ref 78.0–100.0)
Monocytes Absolute: 0.6 10*3/uL (ref 0.1–1.0)
Monocytes Relative: 7.4 % (ref 3.0–12.0)
Neutro Abs: 5.3 10*3/uL (ref 1.4–7.7)
Neutrophils Relative %: 60.3 % (ref 43.0–77.0)
Platelets: 285 10*3/uL (ref 150.0–400.0)
RBC: 4.97 Mil/uL (ref 3.87–5.11)
RDW: 12.7 % (ref 11.5–15.5)
WBC: 8.8 10*3/uL (ref 4.0–10.5)

## 2023-04-29 LAB — HEPATIC FUNCTION PANEL
ALT: 25 U/L (ref 0–35)
AST: 25 U/L (ref 0–37)
Albumin: 4 g/dL (ref 3.5–5.2)
Alkaline Phosphatase: 46 U/L (ref 39–117)
Bilirubin, Direct: 0.1 mg/dL (ref 0.0–0.3)
Total Bilirubin: 0.4 mg/dL (ref 0.2–1.2)
Total Protein: 6.6 g/dL (ref 6.0–8.3)

## 2023-04-29 LAB — BASIC METABOLIC PANEL
BUN: 10 mg/dL (ref 6–23)
CO2: 25 meq/L (ref 19–32)
Calcium: 9.8 mg/dL (ref 8.4–10.5)
Chloride: 106 meq/L (ref 96–112)
Creatinine, Ser: 0.82 mg/dL (ref 0.40–1.20)
GFR: 82.48 mL/min (ref >60.00)
Glucose, Bld: 86 mg/dL (ref 70–99)
Potassium: 4.3 meq/L (ref 3.5–5.1)
Sodium: 139 meq/L (ref 135–145)

## 2023-04-29 LAB — VITAMIN D 25 HYDROXY (VIT D DEFICIENCY, FRACTURES): VITD: 29.43 ng/mL — ABNORMAL LOW (ref 30.00–100.00)

## 2023-04-29 LAB — HEMOGLOBIN A1C: Hgb A1c MFr Bld: 5.1 % (ref 4.6–6.5)

## 2023-04-29 MED ORDER — ZEPBOUND 7.5 MG/0.5ML ~~LOC~~ SOAJ
7.5000 mg | SUBCUTANEOUS | 1 refills | Status: DC
  Filled 2023-04-29: qty 2, 28d supply, fill #0

## 2023-04-29 NOTE — Patient Instructions (Signed)
Follow up in 6 months to recheck BP and cholesterol We'll notify you of your lab results and make any changes if needed Continue to work on healthy diet and regular exercise- you can do it! Call and schedule your colonoscopy Call with any questions or concerns Stay Safe!  Stay Healthy! Happy Belated Birthday!!!

## 2023-04-29 NOTE — Assessment & Plan Note (Signed)
Pt's PE WNL w/ exception of BMI.  UTD on pap, mammo, Tdap.  Due for colonoscopy- pt to schedule.  Check labs.  Anticipatory guidance provided.

## 2023-04-29 NOTE — Assessment & Plan Note (Signed)
Deteriorated.  Has gained 30 lbs since last visit b/c insurance didn't cover [phone].  Stressed need for healthy diet and regular exercise.  Check labs to risk stratify.  Will follow.

## 2023-04-29 NOTE — Assessment & Plan Note (Signed)
Check labs and replete prn. 

## 2023-04-29 NOTE — Progress Notes (Signed)
Subjective:    Patient ID: Jenna Pope, female    DOB: 25-Apr-1971, 52 y.o.   MRN: [id number]  HPI CPE- due for colonoscopy (pt to schedule).  Pt will get flu today.  UTD on mammo, pap, Tdap.  Patient Care Team    Relationship Specialty Notifications Start End  Sheliah Hatch, MD PCP - General Family Medicine  04/19/13   Freddy Finner, MD (Inactive) Consulting Physician Obstetrics and Gynecology  12/22/15     Health Maintenance  Topic Date Due   Colonoscopy  08/12/2023 (Originally 04/29/2023)   INFLUENZA VACCINE  11/10/2023 (Originally 03/13/2023)   MAMMOGRAM  10/18/2023   Cervical Cancer Screening (HPV/Pap Cotest)  08/09/2025   DTaP/Tdap/Td (3 - Td or Tdap) 09/01/2031   Zoster Vaccines- Shingrix  Completed   HPV VACCINES  Aged Out   COVID-19 Vaccine  Discontinued   Hepatitis C Screening  Discontinued   HIV Screening  Discontinued      Review of Systems Patient reports no vision/ hearing changes, adenopathy,fever,  persistant/recurrent hoarseness , swallowing issues, chest pain, palpitations, edema, persistant/recurrent cough, hemoptysis, dyspnea (rest/exertional/paroxysmal nocturnal), gastrointestinal bleeding (melena, rectal bleeding), abdominal pain, significant heartburn, bowel changes, GU symptoms (dysuria, hematuria, incontinence), Gyn symptoms (abnormal  bleeding, pain),  syncope, focal weakness, memory loss, numbness & tingling, skin/hair/nail changes, abnormal bruising or bleeding, anxiety, or depression.   + 32 lb weight gain    Objective:   Physical Exam General Appearance:    Alert, cooperative, no distress, appears stated age  Head:    Normocephalic, without obvious abnormality, atraumatic  Eyes:    PERRL, conjunctiva/corneas clear, EOM's intact both eyes  Ears:    Normal TM's and external ear canals, both ears  Nose:   Nares normal, septum midline, mucosa normal, no drainage    or sinus tenderness  Throat:   Lips, mucosa, and tongue normal; teeth and gums normal   Neck:   Supple, symmetrical, trachea midline, no adenopathy;    Thyroid: no enlargement/tenderness/nodules  Back:     Symmetric, no curvature, ROM normal, no CVA tenderness  Lungs:     Clear to auscultation bilaterally, respirations unlabored  Chest Wall:    No tenderness or deformity   Heart:    Regular rate and rhythm, S1 and S2 normal, no murmur, rub   or gallop  Breast Exam:    Deferred to GYN  Abdomen:     Soft, non-tender, bowel sounds active all four quadrants,    no masses, no organomegaly  Genitalia:    Deferred to GYN  Rectal:    Extremities:   Extremities normal, atraumatic, no cyanosis or edema  Pulses:   2+ and symmetric all extremities  Skin:   Skin color, texture, turgor normal, no rashes or lesions  Lymph nodes:   Cervical, supraclavicular, and axillary nodes normal  Neurologic:   CNII-XII intact, normal strength, sensation and reflexes    throughout          Assessment & Plan:

## 2023-04-30 ENCOUNTER — Telehealth: Payer: Self-pay

## 2023-04-30 NOTE — Telephone Encounter (Signed)
-----   Message from Neena Rhymes sent at 04/30/2023  7:20 AM EDT ----- Labs look good w/ exception of slightly low Vit D.  Please make sure you are taking a daily supplement of at least 2000 units

## 2023-05-21 ENCOUNTER — Institutional Professional Consult (permissible substitution): Payer: No Typology Code available for payment source | Admitting: Primary Care

## 2023-05-27 ENCOUNTER — Other Ambulatory Visit (HOSPITAL_COMMUNITY): Payer: Self-pay

## 2023-05-27 MED ORDER — ZEPBOUND 10 MG/0.5ML ~~LOC~~ SOAJ
10.0000 mg | SUBCUTANEOUS | 1 refills | Status: DC
Start: 2023-05-27 — End: 2024-04-02
  Filled 2023-05-27: qty 2, 28d supply, fill #0

## 2023-06-06 ENCOUNTER — Other Ambulatory Visit (HOSPITAL_COMMUNITY): Payer: No Typology Code available for payment source | Attending: Oncology

## 2023-06-24 ENCOUNTER — Other Ambulatory Visit (HOSPITAL_COMMUNITY): Payer: Self-pay

## 2023-06-24 MED ORDER — ZEPBOUND 10 MG/0.5ML ~~LOC~~ SOAJ
10.0000 mg | SUBCUTANEOUS | 1 refills | Status: DC
  Filled 2023-06-24: qty 2, 28d supply, fill #0
  Filled 2023-09-19: qty 2, 28d supply, fill #1

## 2023-06-27 ENCOUNTER — Other Ambulatory Visit (HOSPITAL_COMMUNITY): Payer: Self-pay

## 2023-06-27 ENCOUNTER — Other Ambulatory Visit (HOSPITAL_BASED_OUTPATIENT_CLINIC_OR_DEPARTMENT_OTHER): Payer: Self-pay

## 2023-07-02 ENCOUNTER — Other Ambulatory Visit (HOSPITAL_COMMUNITY): Payer: Self-pay

## 2023-07-14 ENCOUNTER — Encounter: Payer: Self-pay | Admitting: Primary Care

## 2023-07-14 ENCOUNTER — Ambulatory Visit: Payer: No Typology Code available for payment source | Admitting: Primary Care

## 2023-07-14 DIAGNOSIS — Z8669 Personal history of other diseases of the nervous system and sense organs: Secondary | ICD-10-CM | POA: Diagnosis not present

## 2023-07-14 NOTE — Patient Instructions (Signed)
  Sleep apnea is defined as period of 10 seconds or longer when you stop breathing at night. This can happen multiple times a night. Dx sleep apnea is when this occurs more than 5 times an hour.    Mild OSA 5-15 apneic events an hour Moderate OSA 15-30 apneic events an hour Severe OSA > 30 apneic events an hour   Untreated sleep apnea puts you at higher risk for cardiac arrhythmias, pulmonary HTN, stroke and diabetes  Treatment options include weight loss, side sleeping position, oral appliance, CPAP therapy or referral to ENT for possible surgical options    Recommendations: Focus on side sleeping position or elevate head with wedge pillow 30 degrees Continue to work on weight loss efforts if able  Do not drive if experiencing excessive daytime sleepiness of fatigue    Orders: Home sleep study re: loud snoring (ordered)  Follow-up: Please call to schedule follow-up 1-2 weeks after completing home sleep study to review results and treatment if needed (can be virtual)

## 2023-07-14 NOTE — Progress Notes (Signed)
@Patient  ID: Jenna Pope, female    DOB: 11-30-1970, 52 y.o.   MRN: [id number]  No chief complaint on file.   Referring provider: Salley Scarlet, MD  HPI: 52 year old female, active smoker.  Past medical history significant for active sleep apnea, hypertension, obesity, tobacco abuse.  Former patient of Dr. Wynona Neat, last seen November 2020.  07/14/2023 Patient presents today for sleep consult.  Discussed the use of AI scribe software for clinical note transcription with the patient, who gave verbal consent to proceed.  History of Present Illness   The patient, previously diagnosed with sleep apnea and managed with CPAP therapy from 2013 to 2020, presents for a sleep consult. She reports that her CPAP machine was recalled and subsequently broke, and she has not received a replacement. Since her initial diagnosis, she has lost approximately 60 pounds, which she believes has improved her symptoms. She no longer experiences snoring or gasping for air during sleep, which were previously observed.  The patient also mentions an episode of what she initially thought was a panic attack while driving, but now suspects it may have been a brief dozing off due to sleep apnea. She reports that she sleeps well without the CPAP machine, does not wake up during the night, and does not experience daytime sleepiness.  The patient also discloses that she is currently trying to lose more weight in preparation for a knee replacement surgery. She has started taking Zepbound, an injectable medication, to aid in her weight loss efforts. She typically sleeps on her side and denies any alcohol consumption.  The patient is open to the idea of restarting CPAP therapy if needed, but prefers a full face mask over a nasal mask due to being a mouth breather. She expresses a need to establish a current baseline for her sleep apnea status to inform her upcoming surgery.       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  Other reaction(s): Cough Cough + feels like she has a lump in her throat.     Cough + feels like she has a lump in her throat.    Immunization History  Administered Date(s) Administered   Fluad Quad(high Dose 65+) 08/09/2020   Influenza, Seasonal, Injecte, Preservative Fre 04/29/2023   Influenza,inj,Quad PF,6+ Mos 05/23/2017, 08/09/2020   Influenza,inj,quad, With Preservative 06/24/2019   Influenza-Unspecified 05/23/2017, 06/24/2019, 04/12/2021   PFIZER(Purple Top)SARS-COV-2 Vaccination 03/21/2020, 04/11/2020   Tdap 07/17/2011, 08/31/2021   Zoster Recombinant(Shingrix) 08/31/2021, 02/28/2022   Zoster, Live 03/01/2022    Past Medical History:  Diagnosis Date   Anxiety    Arthritis    knees    Carpal tunnel syndrome    Family history of anesthesia complication    had an aunt cardiac arrest during a knee surgery   Fibroids    GERD (gastroesophageal reflux disease)    Hypertension    Hypothyroidism    Obesity    Shortness of breath    with exertion    Sleep apnea    uses cpap since 2/13  setting at 12     Tobacco History: Social History   Tobacco Use  Smoking Status Former   Current packs/day: 0.00   Average  packs/day: 0.8 packs/day for 20.0 years (15.0 ttl pk-yrs)   Types: Cigarettes   Start date: 05/10/2002   Quit date: 05/10/2022   Years since quitting: 1.1  Smokeless Tobacco Never   Counseling given: Not Answered   Outpatient Medications Prior to Visit  Medication Sig Dispense Refill   albuterol (VENTOLIN HFA) 108 (90 Base) MCG/ACT inhaler Inhale 2 puffs into the lungs every 6 (six) hours as needed for wheezing or shortness of breath. 18 g 3    ALPRAZolam (XANAX) 0.5 MG tablet TAKE 1 TABLET BY MOUTH TWICE A DAY AS NEEDED FOR ANXIETY 60 tablet 1   augmented betamethasone dipropionate (DIPROLENE-AF) 0.05 % cream APPLY TO AFFECTED AREAS OF HANDS  and  FEET TWICE DAILY AS NEEDED (DON T APPLY TO FACE, GROIN, UNDERARMS)     calcipotriene (DOVONOX) 0.005 % cream APPLY TO AFFECTED AREAS OF HANDS  and  FEET TWICE DAILY AS NEEDED     celecoxib (CELEBREX) 200 MG capsule Take 200 mg by mouth daily.     Cholecalciferol (VITAMIN D-1000 MAX ST) 25 MCG (1000 UT) tablet Take by mouth.     estradiol (ESTRACE) 0.5 MG tablet TAKE ONE TABLET BY MOUTH ONE TIME DAILY 90 tablet 2   fenofibrate 160 MG tablet TAKE ONE TABLET BY MOUTH ONE TIME DAILY 90 tablet 1   ibuprofen (ADVIL,MOTRIN) 200 MG tablet Take 400 mg by mouth every 6 (six) hours as needed for moderate pain.     OTEZLA 30 MG TABS Take 1 tablet by mouth 2 (two) times daily.     progesterone (PROMETRIUM) 200 MG capsule Take 1 capsule nightly days 1-15 each month 45 capsule 3   spironolactone (ALDACTONE) 100 MG tablet Take 1 tablet (100 mg total) by mouth 2 (two) times daily. 180 tablet 4   tirzepatide (ZEPBOUND) 10 MG/0.5ML Pen Inject 10 mg into the skin once a week. 2 mL 1   tirzepatide (ZEPBOUND) 10 MG/0.5ML Pen Inject 10 mg into the skin once a week. 2 mL 1   tirzepatide (ZEPBOUND) 5 MG/0.5ML Pen Inject 5 mg into the skin once a week. 2 mL 0   tirzepatide (ZEPBOUND) 7.5 MG/0.5ML Pen Inject 7.5 mg into the skin once a week. 2 mL 1   Facility-Administered Medications Prior to Visit  Medication Dose Route Frequency Provider Last Rate Last Admin   0.9 %  sodium chloride infusion  500 mL Intravenous Once Armbruster, Willaim Rayas, MD        Review of Systems  Review of Systems  Constitutional:  Negative for fatigue.  Respiratory: Negative.    Cardiovascular: Negative.    Physical Exam  There were no vitals taken for this visit. Physical Exam Constitutional:      Appearance: Normal appearance.   HENT:     Head: Normocephalic and atraumatic.     Mouth/Throat:     Mouth: Mucous membranes are moist.     Pharynx: Oropharynx is clear.  Cardiovascular:     Rate and Rhythm: Normal rate and regular rhythm.  Pulmonary:     Effort: Pulmonary effort is normal.     Breath sounds: Normal breath sounds.  Musculoskeletal:        General: Normal range of motion.  Skin:    General: Skin is warm and dry.  Neurological:     General: No focal deficit present.     Mental Status: She is alert and oriented to person, place, and time. Mental status is at baseline.  Psychiatric:  Mood and Affect: Mood normal.        Behavior: Behavior normal.        Thought Content: Thought content normal.        Judgment: Judgment normal.      Lab Results:  CBC    Component Value Date/Time   WBC 8.8 04/29/2023 0929   RBC 4.97 04/29/2023 0929   HGB 15.2 (H) 04/29/2023 0929   HGB 15.6 (H) 02/18/2020 1523   HGB 16.3 (H) 04/25/2017 1507   HCT 46.0 04/29/2023 0929   HCT 46.8 (H) 04/25/2017 1507   PLT 285.0 04/29/2023 0929   PLT 238 02/18/2020 1523   PLT 243 04/25/2017 1507   MCV 92.6 04/29/2023 0929   MCV 90 04/25/2017 1507   MCH 30.9 02/18/2020 1523   MCHC 33.1 04/29/2023 0929   RDW 12.7 04/29/2023 0929   RDW 12.7 04/25/2017 1507   LYMPHSABS 2.7 04/29/2023 0929   LYMPHSABS 3.1 04/25/2017 1507   MONOABS 0.6 04/29/2023 0929   EOSABS 0.1 04/29/2023 0929   EOSABS 0.1 04/25/2017 1507   BASOSABS 0.0 04/29/2023 0929   BASOSABS 0.0 04/25/2017 1507    BMET    Component Value Date/Time   NA 139 04/29/2023 0929   K 4.3 04/29/2023 0929   CL 106 04/29/2023 0929   CO2 25 04/29/2023 0929   GLUCOSE 86 04/29/2023 0929   BUN 10 04/29/2023 0929   CREATININE 0.82 04/29/2023 0929   CREATININE 0.67 02/18/2020 1523   CREATININE 0.59 06/30/2017 1536   CALCIUM 9.8 04/29/2023 0929   GFRNONAA >60 02/18/2020 1523   GFRNONAA >89 04/08/2014 1053   GFRAA >60 02/18/2020 1523   GFRAA >89 04/08/2014 1053     BNP No results found for: "BNP"  ProBNP No results found for: "PROBNP"  Imaging: No results found.   Assessment & Plan:   1. Morbid obesity (HCC) - Home sleep test; Future  2. Hx of sleep apnea - Home sleep test; Future   Obstructive Sleep Apnea (OSA) History of severe OSA managed with CPAP (pressure 12) from 2013-2020. CPAP machine recalled and not replaced. Significant weight loss (~60 lbs) since initial diagnosis with reported improvement in snoring and no current episodes of gasping for air. No daytime sleepiness reported. -Order home sleep study to reestablish diagnosis and severity of OSA in the context of significant weight loss. -If OSA is confirmed and moderate to severe, consider restarting CPAP therapy with a full face mask (patient preference).  Weight Management Ongoing weight loss efforts with the goal of reducing BMI for knee replacement surgery. Currently on Zepbound for weight loss. -Continue current weight loss efforts and medication.  Knee Osteoarthritis Bilateral knee pain, planning for knee replacement surgery once BMI is reduced. -Continue weight loss efforts to meet surgical criteria. -Ensure sleep apnea is managed appropriately prior to surgery.      Glenford Bayley, NP 07/14/2023

## 2023-07-29 ENCOUNTER — Other Ambulatory Visit (HOSPITAL_COMMUNITY): Payer: Self-pay

## 2023-07-29 MED ORDER — ZEPBOUND 12.5 MG/0.5ML ~~LOC~~ SOAJ
12.5000 mg | SUBCUTANEOUS | 0 refills | Status: DC
  Filled 2023-07-29: qty 2, 28d supply, fill #0

## 2023-07-30 ENCOUNTER — Encounter: Payer: Self-pay | Admitting: Family Medicine

## 2023-07-30 DIAGNOSIS — F418 Other specified anxiety disorders: Secondary | ICD-10-CM

## 2023-07-31 MED ORDER — ALPRAZOLAM 0.5 MG PO TABS: ORAL_TABLET | ORAL | 1 refills | Status: DC

## 2023-09-11 ENCOUNTER — Other Ambulatory Visit (HOSPITAL_COMMUNITY): Payer: Self-pay

## 2023-09-11 MED ORDER — ZEPBOUND 12.5 MG/0.5ML ~~LOC~~ SOAJ
12.5000 mg | SUBCUTANEOUS | 1 refills | Status: DC
  Filled 2023-09-19: qty 2, 28d supply, fill #0

## 2023-09-19 ENCOUNTER — Other Ambulatory Visit (HOSPITAL_COMMUNITY): Payer: Self-pay

## 2023-09-19 ENCOUNTER — Other Ambulatory Visit: Payer: Self-pay

## 2023-10-09 ENCOUNTER — Other Ambulatory Visit (HOSPITAL_COMMUNITY): Payer: Self-pay

## 2023-10-09 MED ORDER — PHENTERMINE HCL 37.5 MG PO TABS
18.7500 mg | ORAL_TABLET | Freq: Every day | ORAL | 0 refills | Status: DC
  Filled 2023-10-09: qty 30, 30d supply, fill #0

## 2023-10-09 MED ORDER — ZEPBOUND 12.5 MG/0.5ML ~~LOC~~ SOAJ
12.5000 mg | SUBCUTANEOUS | 1 refills | Status: DC
  Filled 2023-10-09 – 2023-10-15 (×2): qty 2, 28d supply, fill #0

## 2023-10-15 ENCOUNTER — Other Ambulatory Visit (HOSPITAL_COMMUNITY): Payer: Self-pay

## 2023-10-27 ENCOUNTER — Ambulatory Visit (HOSPITAL_BASED_OUTPATIENT_CLINIC_OR_DEPARTMENT_OTHER): Payer: No Typology Code available for payment source | Admitting: Obstetrics & Gynecology

## 2023-10-27 ENCOUNTER — Ambulatory Visit: Payer: No Typology Code available for payment source | Admitting: Family Medicine

## 2023-10-27 ENCOUNTER — Other Ambulatory Visit (HOSPITAL_COMMUNITY)
Admission: RE | Admit: 2023-10-27 | Discharge: 2023-10-27 | Disposition: A | Discharge status: Home / Self Care | Source: Ambulatory Visit | Attending: Obstetrics & Gynecology | Admitting: Obstetrics & Gynecology

## 2023-10-27 ENCOUNTER — Encounter (HOSPITAL_BASED_OUTPATIENT_CLINIC_OR_DEPARTMENT_OTHER): Payer: Self-pay | Admitting: Obstetrics & Gynecology

## 2023-10-27 VITALS — BP 130/84 | HR 98 | Ht 61.0 in | Wt 217.2 lb

## 2023-10-27 DIAGNOSIS — Z79890 Hormone replacement therapy (postmenopausal): Secondary | ICD-10-CM

## 2023-10-27 DIAGNOSIS — Z124 Encounter for screening for malignant neoplasm of cervix: Secondary | ICD-10-CM | POA: Diagnosis present

## 2023-10-27 DIAGNOSIS — B372 Candidiasis of skin and nail: Secondary | ICD-10-CM

## 2023-10-27 DIAGNOSIS — R232 Flushing: Secondary | ICD-10-CM | POA: Diagnosis not present

## 2023-10-27 DIAGNOSIS — Z01419 Encounter for gynecological examination (general) (routine) without abnormal findings: Secondary | ICD-10-CM

## 2023-10-27 DIAGNOSIS — I10 Essential (primary) hypertension: Secondary | ICD-10-CM | POA: Diagnosis not present

## 2023-10-27 DIAGNOSIS — Z87891 Personal history of nicotine dependence: Secondary | ICD-10-CM

## 2023-10-27 DIAGNOSIS — L680 Hirsutism: Secondary | ICD-10-CM

## 2023-10-27 MED ORDER — NYSTATIN 100000 UNIT/GM EX CREA: 1.0000 | TOPICAL_CREAM | Freq: Two times a day (BID) | CUTANEOUS | 2 refills | Status: DC

## 2023-10-27 MED ORDER — ESTRADIOL 0.5 MG PO TABS: 0.5000 mg | ORAL_TABLET | Freq: Every day | ORAL | 3 refills | Status: AC

## 2023-10-27 MED ORDER — SPIRONOLACTONE 100 MG PO TABS: 100.0000 mg | ORAL_TABLET | Freq: Two times a day (BID) | ORAL | 4 refills | Status: AC

## 2023-10-27 MED ORDER — PROGESTERONE 200 MG PO CAPS
200.0000 mg | ORAL_CAPSULE | Freq: Every day | ORAL | 3 refills | Status: AC
Start: 2023-10-27 — End: ?

## 2023-10-27 MED ORDER — NYSTATIN 100000 UNIT/GM EX POWD: 1.0000 | Freq: Three times a day (TID) | CUTANEOUS | 0 refills | Status: AC

## 2023-10-27 NOTE — Progress Notes (Signed)
 ANNUAL EXAM Patient name: Jenna Pope MRN [id number]  Date of birth: May 04, 1971 Chief Complaint:   AEX  History of Present Illness:   Maurie Musco is a 53 y.o. G5P1001 Caucasian female being seen today for a routine annual exam.  She is PMP,  Denies vaginal bleeding.    Patient's last menstrual period was 09/15/2021.   Last pap 08/09/2020. Results were: NILM w/ HRHPV negative. H/O abnormal pap: no Last mammogram: 10/18/2022. Results were: normal. Family h/o breast cancer: no Last colonoscopy: 04/28/2020. Results were: adenomatous polyps.  Family h/o colorectal cancer: no.  Pt aware this is overdue as 3 year f/u recommended.       10/27/2023    8:33 AM 04/29/2023    9:02 AM 10/04/2022    8:22 AM 09/02/2022    8:32 AM 03/01/2022   10:10 AM  Depression screen PHQ 2/9  Decreased Interest 0 0 0 0 0  Down, Depressed, Hopeless 0 0 0 0 0  PHQ - 2 Score 0 0 0 0 0  Altered sleeping  0  0 0  Tired, decreased energy  0  1 1  Change in appetite  0  0 0  Feeling bad or failure about yourself   0  0 0  Trouble concentrating  0  0 0  Moving slowly or fidgety/restless  0  0 0  Suicidal thoughts  0  0 0  PHQ-9 Score  0  1 1  Difficult doing work/chores  Not difficult at all   Not difficult at all    Review of Systems:   Pertinent items are noted in HPI  Denies any vaginal bleeding, vaginal discharge, pelvic or abdominal pain.  Having some constipation with Zepbound.  Denies urinary symptoms.   Pertinent History Reviewed:  Reviewed past medical,surgical, social and family history.  Reviewed problem list, medications and allergies. Physical Assessment:   Vitals:   10/27/23 0830 10/27/23 0854  BP: (!) 143/87 130/84  Pulse: 98   Weight: 217 lb 3.2 oz (98.5 kg)   Height: 5\' 1"  (1.549 m)   Body mass index is 41.04 kg/m.        Physical Examination:   General appearance - well appearing, and in no distress  Mental status - alert, oriented to person, place, and time  Psych:  She has a  normal mood and affect  Skin - warm and dry, normal color, no suspicious lesions noted  Chest - effort normal, all lung fields clear to auscultation bilaterally  Heart - normal rate and regular rhythm  Neck:  midline trachea, no thyromegaly or nodules  Breasts - breasts appear normal, no suspicious masses, no skin or nipple changes or  axillary nodes  Abdomen - soft, nontender, nondistended, no masses or organomegaly  Pelvic - VULVA: normal appearing vulva with no masses, tenderness or lesions   VAGINA: normal appearing vagina with normal color and discharge, no lesions   CERVIX: normal appearing cervix without discharge or lesions, no CMT  Thin prep pap is done with HR HPV cotesting  UTERUS: uterus is felt to be normal size, shape, consistency and nontender   ADNEXA: No adnexal masses or tenderness noted.  Rectal - normal rectal, good sphincter tone, no masses felt.  Extremities:  No swelling or varicosities noted  Chaperone present for exam, Ina Homes, CMA. Assessment & Plan:  1. Well woman exam with routine gynecological exam (Primary) - Pap smear and HR HPV obtained today - Mammogram done 10/22/2022 - Colonoscopy 2021.  Pt aware this is due. - Bone mineral density not indicated at this time. - lab work done with PCP, Dr. Beverely Low - vaccines reviewed/updated  2. Primary hypertension  3. Hormone replacement therapy - estradiol (ESTRACE) 0.5 MG tablet; Take 1 tablet (0.5 mg total) by mouth daily.  Dispense: 90 tablet; Refill: 3 - progesterone (PROMETRIUM) 200 MG capsule; Take 1 capsule (200 mg total) by mouth daily. Take 1 capsule nightly days 1-15 each month  Dispense: 90 capsule; Refill: 3  4. Hirsutism - spironolactone (ALDACTONE) 100 MG tablet; Take 1 tablet (100 mg total) by mouth 2 (two) times daily.  Dispense: 180 tablet; Refill: 4 - had BMP and hepatic panel in September and this was normal.  5. History of smoking - stopped smoking 04/2022  6. Candidal skin infection -  RX for nystatin cream and antifungal powder to pharmacy  Meds:  Meds ordered this encounter  Medications   spironolactone (ALDACTONE) 100 MG tablet    Sig: Take 1 tablet (100 mg total) by mouth 2 (two) times daily.    Dispense:  180 tablet    Refill:  4   estradiol (ESTRACE) 0.5 MG tablet    Sig: Take 1 tablet (0.5 mg total) by mouth daily.    Dispense:  90 tablet    Refill:  3   progesterone (PROMETRIUM) 200 MG capsule    Sig: Take 1 capsule (200 mg total) by mouth daily. Take 1 capsule nightly days 1-15 each month    Dispense:  90 capsule    Refill:  3   nystatin cream (MYCOSTATIN)    Sig: Apply 1 Application topically 2 (two) times daily. Apply to affected area BID for up to 7 days.    Dispense:  30 g    Refill:  2   nystatin powder    Sig: Apply 1 Application topically 3 (three) times daily.    Dispense:  15 g    Refill:  0    Follow-up: Return in about 1 year (around 10/26/2024).  Jerene Bears, MD 10/27/2023 9:07 AM

## 2023-10-27 NOTE — Patient Instructions (Signed)
 Dedra Skeens, MD, [facility].  Bariatric Surgeon.

## 2023-10-30 LAB — CYTOLOGY - PAP
Comment: NEGATIVE
Diagnosis: NEGATIVE
Diagnosis: REACTIVE
High risk HPV: NEGATIVE

## 2023-10-31 ENCOUNTER — Encounter (HOSPITAL_BASED_OUTPATIENT_CLINIC_OR_DEPARTMENT_OTHER): Payer: Self-pay | Admitting: Obstetrics & Gynecology

## 2023-11-10 ENCOUNTER — Ambulatory Visit: Admitting: Family Medicine

## 2023-11-10 ENCOUNTER — Encounter: Payer: Self-pay | Admitting: Family Medicine

## 2023-11-10 VITALS — BP 110/72 | HR 86 | Temp 98.6°F | Ht 61.0 in | Wt 220.0 lb

## 2023-11-10 DIAGNOSIS — I10 Essential (primary) hypertension: Secondary | ICD-10-CM | POA: Diagnosis not present

## 2023-11-10 DIAGNOSIS — Z1159 Encounter for screening for other viral diseases: Secondary | ICD-10-CM

## 2023-11-10 DIAGNOSIS — E781 Pure hyperglyceridemia: Secondary | ICD-10-CM

## 2023-11-10 DIAGNOSIS — Z114 Encounter for screening for human immunodeficiency virus [HIV]: Secondary | ICD-10-CM

## 2023-11-10 LAB — CBC WITH DIFFERENTIAL/PLATELET
Basophils Absolute: 0 10*3/uL (ref 0.0–0.1)
Basophils Relative: 0.4 % (ref 0.0–3.0)
Eosinophils Absolute: 0.1 10*3/uL (ref 0.0–0.7)
Eosinophils Relative: 1.7 % (ref 0.0–5.0)
HCT: 42.9 % (ref 36.0–46.0)
Hemoglobin: 14.6 g/dL (ref 12.0–15.0)
Lymphocytes Relative: 27.7 % (ref 12.0–46.0)
Lymphs Abs: 2.3 10*3/uL (ref 0.7–4.0)
MCHC: 34 g/dL (ref 30.0–36.0)
MCV: 90.3 fl (ref 78.0–100.0)
Monocytes Absolute: 0.6 10*3/uL (ref 0.1–1.0)
Monocytes Relative: 7.4 % (ref 3.0–12.0)
Neutro Abs: 5.2 10*3/uL (ref 1.4–7.7)
Neutrophils Relative %: 62.8 % (ref 43.0–77.0)
Platelets: 260 10*3/uL (ref 150.0–400.0)
RBC: 4.75 Mil/uL (ref 3.87–5.11)
RDW: 12.8 % (ref 11.5–15.5)
WBC: 8.3 10*3/uL (ref 4.0–10.5)

## 2023-11-10 LAB — BASIC METABOLIC PANEL WITH GFR
BUN: 13 mg/dL (ref 6–23)
CO2: 24 meq/L (ref 19–32)
Calcium: 9.3 mg/dL (ref 8.4–10.5)
Chloride: 110 meq/L (ref 96–112)
Creatinine, Ser: 0.67 mg/dL (ref 0.40–1.20)
GFR: 100.41 mL/min (ref >60.00)
Glucose, Bld: 85 mg/dL (ref 70–99)
Potassium: 4.1 meq/L (ref 3.5–5.1)
Sodium: 142 meq/L (ref 135–145)

## 2023-11-10 LAB — HEPATIC FUNCTION PANEL
ALT: 21 U/L (ref 0–35)
AST: 20 U/L (ref 0–37)
Albumin: 4 g/dL (ref 3.5–5.2)
Alkaline Phosphatase: 70 U/L (ref 39–117)
Bilirubin, Direct: 0.1 mg/dL (ref 0.0–0.3)
Total Bilirubin: 0.4 mg/dL (ref 0.2–1.2)
Total Protein: 6.6 g/dL (ref 6.0–8.3)

## 2023-11-10 LAB — HEMOGLOBIN A1C: Hgb A1c MFr Bld: 4.9 % (ref 4.6–6.5)

## 2023-11-10 LAB — LIPID PANEL
Cholesterol: 134 mg/dL (ref 0–200)
HDL: 38.8 mg/dL — ABNORMAL LOW (ref >39.00)
LDL Cholesterol: 72 mg/dL (ref 0–99)
NonHDL: 95.17
Total CHOL/HDL Ratio: 3
Triglycerides: 115 mg/dL (ref 0.0–149.0)
VLDL: 23 mg/dL (ref 0.0–40.0)

## 2023-11-10 LAB — TSH: TSH: 2.03 u[IU]/mL (ref 0.35–5.50)

## 2023-11-10 NOTE — Patient Instructions (Addendum)
 Schedule your complete physical in 6 months We'll notify you of your lab results and make any changes if needed Continue to work on low carb diet and physical activity as able Call with any questions or concerns Stay Safe!  Stay Healthy! Happy Spring!!

## 2023-11-10 NOTE — Assessment & Plan Note (Signed)
 BMI currently 41.57  She is on Zepbound 12.5mg  weekly w/o difficulty.  She is not currently able to exercise b/c both knees need to be replaced.  She is considering a panniculectomy to help offload some of her weight.  Will continue to follow.

## 2023-11-10 NOTE — Progress Notes (Signed)
   Subjective:    Patient ID: Jenna Pope, female    DOB: 02-Nov-1970, 53 y.o.   MRN: [id number]  HPI Hypertriglyceridemia- ongoing issue.  On Fenofibrate 160mg  daily.  No abd pain, N/V.  HTN- chronic problem, on Spironolactone 100mg  BID.  No CP, SOB, HA's, visual changes, edema.  Obesity- ongoing issue.  Current BMI 41.57  Currently on Zepbound 12.5mg  weekly.  Pt struggles w/ exercise b/c both knees need to be replaced.     Review of Systems For ROS see HPI     Objective:   Physical Exam Vitals reviewed.  Constitutional:      General: She is not in acute distress.    Appearance: Normal appearance. She is well-developed. She is obese. She is not ill-appearing.  HENT:     Head: Normocephalic and atraumatic.  Eyes:     Conjunctiva/sclera: Conjunctivae normal.     Pupils: Pupils are equal, round, and reactive to light.  Neck:     Thyroid: No thyromegaly.  Cardiovascular:     Rate and Rhythm: Normal rate and regular rhythm.     Pulses: Normal pulses.     Heart sounds: Normal heart sounds. No murmur heard. Pulmonary:     Effort: Pulmonary effort is normal. No respiratory distress.     Breath sounds: Normal breath sounds.  Abdominal:     General: There is no distension.     Palpations: Abdomen is soft.     Tenderness: There is no abdominal tenderness.  Musculoskeletal:     Cervical back: Normal range of motion and neck supple.     Right lower leg: No edema.     Left lower leg: No edema.  Lymphadenopathy:     Cervical: No cervical adenopathy.  Skin:    General: Skin is warm and dry.  Neurological:     General: No focal deficit present.     Mental Status: She is alert and oriented to person, place, and time.  Psychiatric:        Mood and Affect: Mood normal.        Behavior: Behavior normal.           Assessment & Plan:

## 2023-11-10 NOTE — Assessment & Plan Note (Signed)
 Chronic problem, on Fenofibrate 160mg  daily w/o difficulty.  Check labs.  Adjust meds prn

## 2023-11-10 NOTE — Assessment & Plan Note (Signed)
 Chronic problem.  On Spironolactone 100mg  BID w/o difficulty and excellent control.  Check labs due to diuretic use.  No anticipated changes

## 2023-11-11 LAB — HIV ANTIBODY (ROUTINE TESTING W REFLEX): HIV 1&2 Ab, 4th Generation: NONREACTIVE

## 2023-11-11 LAB — HEPATITIS C ANTIBODY: Hepatitis C Ab: NONREACTIVE

## 2023-11-12 ENCOUNTER — Other Ambulatory Visit (HOSPITAL_BASED_OUTPATIENT_CLINIC_OR_DEPARTMENT_OTHER): Payer: Self-pay | Admitting: Obstetrics & Gynecology

## 2023-11-12 ENCOUNTER — Encounter (HOSPITAL_BASED_OUTPATIENT_CLINIC_OR_DEPARTMENT_OTHER): Payer: Self-pay | Admitting: Obstetrics & Gynecology

## 2023-11-12 DIAGNOSIS — R634 Abnormal weight loss: Secondary | ICD-10-CM

## 2023-11-12 DIAGNOSIS — E65 Localized adiposity: Secondary | ICD-10-CM

## 2023-11-12 DIAGNOSIS — B372 Candidiasis of skin and nail: Secondary | ICD-10-CM

## 2023-11-17 ENCOUNTER — Other Ambulatory Visit (HOSPITAL_COMMUNITY): Payer: Self-pay

## 2023-11-17 ENCOUNTER — Encounter (HOSPITAL_COMMUNITY): Payer: Self-pay

## 2023-11-17 MED ORDER — PHENTERMINE HCL 37.5 MG PO TABS
18.7500 mg | ORAL_TABLET | Freq: Every day | ORAL | 0 refills | Status: AC
  Filled 2023-11-17: qty 30, 30d supply, fill #0

## 2023-11-17 MED ORDER — ZEPBOUND 10 MG/0.5ML ~~LOC~~ SOAJ
10.0000 mg | SUBCUTANEOUS | 1 refills | Status: DC
  Filled 2023-11-17: qty 2, 28d supply, fill #0

## 2023-11-17 MED ORDER — ONDANSETRON 4 MG PO TBDP
4.0000 mg | ORAL_TABLET | Freq: Four times a day (QID) | ORAL | 0 refills | Status: AC | PRN
  Filled 2023-11-17: qty 18, 21d supply, fill #0

## 2023-11-21 ENCOUNTER — Other Ambulatory Visit (HOSPITAL_COMMUNITY): Payer: Self-pay

## 2023-11-21 MED ORDER — ZEPBOUND 10 MG/0.5ML ~~LOC~~ SOAJ
10.0000 mg | SUBCUTANEOUS | 0 refills | Status: DC
  Filled 2023-11-21: qty 2, 28d supply, fill #0

## 2023-12-16 ENCOUNTER — Telehealth: Payer: Self-pay | Admitting: Family Medicine

## 2023-12-16 NOTE — Telephone Encounter (Signed)
 Placed form in folder at nurse station Pt need surgical clearance appt.

## 2023-12-16 NOTE — Telephone Encounter (Signed)
 Surgical Clearance was sent from [facility] and needs signatures and sent back to its designated place. Paper work is placed in [employer] loss adjuster.  Please advise, Thanks

## 2023-12-17 NOTE — Telephone Encounter (Signed)
 Noted.

## 2023-12-19 ENCOUNTER — Other Ambulatory Visit

## 2023-12-19 DIAGNOSIS — Z006 Encounter for examination for normal comparison and control in clinical research program: Secondary | ICD-10-CM

## 2023-12-22 ENCOUNTER — Ambulatory Visit: Admitting: Family Medicine

## 2023-12-22 ENCOUNTER — Encounter: Payer: Self-pay | Admitting: Family Medicine

## 2023-12-23 NOTE — Telephone Encounter (Signed)
 Pt was told she needs to be a certain BMI prior to scheduling surgery.  Surgical clearance appt cancelled until further notice.

## 2023-12-23 NOTE — Progress Notes (Signed)
 This encounter was created in error - please disregard.

## 2023-12-25 ENCOUNTER — Other Ambulatory Visit (HOSPITAL_COMMUNITY): Payer: Self-pay

## 2023-12-25 MED ORDER — ZEPBOUND 5 MG/0.5ML ~~LOC~~ SOAJ
5.0000 mg | SUBCUTANEOUS | 0 refills | Status: DC
Start: 2023-12-25 — End: 2024-04-02
  Filled 2023-12-25: qty 2, 28d supply, fill #0

## 2023-12-26 ENCOUNTER — Encounter

## 2023-12-26 ENCOUNTER — Other Ambulatory Visit: Payer: Self-pay

## 2023-12-26 DIAGNOSIS — Z8669 Personal history of other diseases of the nervous system and sense organs: Secondary | ICD-10-CM

## 2023-12-26 LAB — GENECONNECT MOLECULAR SCREEN: Genetic Analysis Overall Interpretation: NEGATIVE

## 2024-01-04 DIAGNOSIS — G4733 Obstructive sleep apnea (adult) (pediatric): Secondary | ICD-10-CM | POA: Diagnosis not present

## 2024-01-21 ENCOUNTER — Other Ambulatory Visit (HOSPITAL_COMMUNITY): Payer: Self-pay

## 2024-01-21 MED ORDER — WEGOVY 0.5 MG/0.5ML ~~LOC~~ SOAJ
0.5000 mg | SUBCUTANEOUS | 0 refills | Status: AC
  Filled 2024-01-21: qty 2, 28d supply, fill #0

## 2024-01-27 ENCOUNTER — Encounter: Payer: Self-pay | Admitting: Student

## 2024-01-27 ENCOUNTER — Ambulatory Visit: Admitting: Student

## 2024-01-27 VITALS — BP 132/84 | HR 100 | Ht 61.0 in | Wt 229.0 lb

## 2024-01-27 DIAGNOSIS — M793 Panniculitis, unspecified: Secondary | ICD-10-CM | POA: Diagnosis not present

## 2024-01-27 NOTE — Progress Notes (Cosign Needed)
   Referring Provider Jess Morita, MD [phone],  [place] [address]   CC:  Chief Complaint  Patient presents with   Follow-up      Jenna Pope is an 53 y.o. female.  HPI: Patient is a 53 year old female with history of panniculitis.  She presents to the clinic today for further follow-up about potentially moving forward with surgery.  She was seen for initial consult by Dr. Orin Birk on 08/06/2022.  At this visit, patient reported that she had underwent gastric lap band surgery in 2016 and did not see much improvement in her weight until 2020.  Patient at this visit was around 200 pounds and her BMI was 39.7 kg/m.  She had quit smoking 3 months prior.  Patient had just switched insurance and was going to follow back up in 2 to 3 months.  She was interested in pursuing a panniculectomy and possibly abdominoplasty.  Today, patient reports she is doing well.  She states that she has been trying to lose weight, but finds it very difficult to exercise given her pannus.  She states that she needs a knee replacement, but was told by her orthopedic surgeon that she needs to lose more weight before proceeding with surgery.  Patient also states that she feels her pannus interferes with various aspects of her daily life.  Patient states that she quit smoking back in 2023, but reports that she has since started to vape.  She states that she is trying to stop vaping.  She denies any other recent fevers, chills or changes in her health.  Patient states that she would like to move forward with panniculectomy.  Review of Systems General: Denies any recent fevers, chills or changes in her health  Physical Exam    01/27/2024    1:27 PM 12/22/2023    1:30 PM 11/10/2023    8:30 AM  Vitals with BMI  Height 5' 1  5' 1  Weight 229 lbs 225 lbs 2 oz 220 lbs  BMI 43.29  41.59  Systolic 132 120 347  Diastolic 84 80 72  Pulse 100 56 86    General:  No acute distress,  Alert and  oriented, Non-Toxic, Normal speech and affect Pulm: Unlabored breathing, no respiratory distress MSK: Ambulatory Abdomen: Large pannus noted Psych: Normal mood, normal behavior  Assessment/Plan  Panniculitis   I discussed with the patient that she would need to stop vaping for at least 3 months prior to surgery.  I discussed with her that vaping and smoking any sort of nicotine  or tobacco products can increase her risks of perioperative issues including delayed wound healing.  Patient expressed understanding.  I also discussed with the patient that she should continue to try to lose weight.  She states that her doctor recently put her on Wegovy .  I discussed with the patient that the higher her BMI is, the more high risk she is of perioperative complications.  Patient expressed understanding.  I recommended that the patient follow back up in about 2-1/2 months to see where she is at with smoking and weight loss.  I instructed her to call in the meantime if she has any questions or concerns about anything.  Pictures were obtained of the patient and placed in the chart with the patient's or guardian's permission.    Jenna Pope 01/27/2024, 4:06 PM

## 2024-02-16 ENCOUNTER — Other Ambulatory Visit (INDEPENDENT_AMBULATORY_CARE_PROVIDER_SITE_OTHER): Payer: Self-pay | Admitting: Surgery

## 2024-02-16 DIAGNOSIS — Z1231 Encounter for screening mammogram for malignant neoplasm of breast: Secondary | ICD-10-CM

## 2024-02-27 NOTE — Telephone Encounter (Signed)
 Scanned surgical clearance form into chart for future need when patient reaches BMI requested by surgeon

## 2024-03-01 ENCOUNTER — Ambulatory Visit: Payer: Self-pay | Admitting: Primary Care

## 2024-03-01 NOTE — Progress Notes (Signed)
 HST showed moderate OSA, please schedule virtual visit to discuss results and treatment options

## 2024-03-02 NOTE — Progress Notes (Signed)
 ATC X1. Unable to lvm as mailbox is full.

## 2024-03-03 NOTE — Progress Notes (Signed)
 I called and spoke to pt. Pt informed of Beth's note and verbalized understanding. Routing to the front desk for scheduling. NFN

## 2024-03-11 NOTE — Telephone Encounter (Signed)
 Appointment scheduled.

## 2024-03-16 ENCOUNTER — Other Ambulatory Visit (HOSPITAL_COMMUNITY): Payer: Self-pay

## 2024-03-16 ENCOUNTER — Ambulatory Visit
Admission: RE | Admit: 2024-03-16 | Discharge: 2024-03-16 | Disposition: A | Discharge status: Home / Self Care | Source: Ambulatory Visit | Attending: Surgery | Admitting: Surgery

## 2024-03-16 ENCOUNTER — Encounter: Payer: Self-pay | Admitting: Family Medicine

## 2024-03-16 DIAGNOSIS — Z1231 Encounter for screening mammogram for malignant neoplasm of breast: Secondary | ICD-10-CM

## 2024-03-16 DIAGNOSIS — F418 Other specified anxiety disorders: Secondary | ICD-10-CM

## 2024-03-16 MED ORDER — WEGOVY 1 MG/0.5ML ~~LOC~~ SOAJ
1.0000 mg | SUBCUTANEOUS | 0 refills | Status: AC
  Filled 2024-03-16: qty 2, 28d supply, fill #0

## 2024-03-17 ENCOUNTER — Other Ambulatory Visit: Payer: Self-pay

## 2024-03-17 DIAGNOSIS — F418 Other specified anxiety disorders: Secondary | ICD-10-CM

## 2024-03-17 MED ORDER — ALPRAZOLAM 0.5 MG PO TABS: ORAL_TABLET | ORAL | 1 refills | Status: AC

## 2024-04-02 ENCOUNTER — Telehealth (HOSPITAL_BASED_OUTPATIENT_CLINIC_OR_DEPARTMENT_OTHER): Admitting: Primary Care

## 2024-04-02 ENCOUNTER — Telehealth: Payer: Self-pay | Admitting: Primary Care

## 2024-04-02 DIAGNOSIS — G4733 Obstructive sleep apnea (adult) (pediatric): Secondary | ICD-10-CM | POA: Diagnosis not present

## 2024-04-02 NOTE — Progress Notes (Signed)
 Virtual Visit via Video Note  I connected with Jenna Pope on 04/02/24 at  1:30 PM EDT by a video enabled telemedicine application and verified that I am speaking with the correct person using two identifiers.  Location: Patient: Home Provider: Office   I discussed the limitations of evaluation and management by telemedicine and the availability of in person appointments. The patient expressed understanding and agreed to proceed.  History of Present Illness: 53 year old female, active smoker.  Past medical history significant for active sleep apnea, hypertension, obesity, tobacco abuse.  Former patient of Dr. Neda, last seen November 2020.  Previous [facility] pulmonary encounter: 07/14/2023 Patient presents today for sleep consult.  Discussed the use of AI scribe software for clinical note transcription with the patient, who gave verbal consent to proceed.  History of Present Illness   The patient, previously diagnosed with sleep apnea and managed with CPAP therapy from 2013 to 2020, presents for a sleep consult. She reports that her CPAP machine was recalled and subsequently broke, and she has not received a replacement. Since her initial diagnosis, she has lost approximately 60 pounds, which she believes has improved her symptoms. She no longer experiences snoring or gasping for air during sleep, which were previously observed.  The patient also mentions an episode of what she initially thought was a panic attack while driving, but now suspects it may have been a brief dozing off due to sleep apnea. She reports that she sleeps well without the CPAP machine, does not wake up during the night, and does not experience daytime sleepiness.  The patient also discloses that she is currently trying to lose more weight in preparation for a knee replacement surgery. She has started taking Zepbound , an injectable medication, to aid in her weight loss efforts. She typically sleeps on her side and denies any  alcohol consumption.  The patient is open to the idea of restarting CPAP therapy if needed, but prefers a full face mask over a nasal mask due to being a mouth breather. She expresses a need to establish a current baseline for her sleep apnea status to inform her upcoming surgery.     04/02/2024- Interim hx  Discussed the use of AI scribe software for clinical note transcription with the patient, who gave verbal consent to proceed.  History of Present Illness Jenna Pope is a 53 year old female with moderate sleep apnea who presents for follow-up regarding her sleep disorder management.  She has a history of severe sleep apnea previously managed with a CPAP machine, which was recalled and not replaced. A repeat sleep study on May 16th showed moderate sleep apnea with an average of 20.9 apneic events per hour and oxygen desaturation to 80%, with 14 minutes spent below 88% oxygen saturation. She prefers to focus on weight loss before reconsidering CPAP therapy.  She is taking Wegovy , recently increased to 1 mg, as part of a weight management program through her [employer], which requires participation in a diabetes-based program despite her not having diabetes. She finds the CPAP cumbersome, especially during travel, and is interested in losing weight to potentially discontinue its use.  Her sleep quality has been affected by recent family stressors, including her father's stroke and her brother's kidney cancer surgery. Despite these events, she feels her sleep apnea has improved, stating 'I don't feel the CPAP helped, but it was such a pain.' She experiences minimal snoring now, as reported by her husband, and does not feel excessively tired during the day.  No significant cardiac history, such as atrial fibrillation, and she does not consume alcohol. She reports feeling rested and sleeps soundly, except for occasional nocturnal awakenings to use the bathroom.   Observations/Objective:  -  Appears well without overt respiratory symptoms   Chest imaging 06/24/23 CXR>> Lungs clear, no active cardiopulmonary disease   Pulmonary function testing FVC 2.68 (80%), FEV1 2.15 (80%), TLC 80, DLCO 19.07 (96%) Interpretation: minimal obstructive lung disease affecting small airway disease  Sleep testing: HST 12/26/23 >> moderate OSA, AHI 20.9/hour with spo2 low 80%. Patient spent 14 mins with SpO2 <88%.   Assessment and Plan:  1. OSA (obstructive sleep apnea) (Primary)  Assessment and Plan Assessment & Plan Obstructive sleep apnea, moderate Moderate obstructive sleep apnea confirmed by a home sleep study on May 16th, showing an average of 20.9 apneic events per hour and oxygen desaturation to 80%. Minimal symptoms reported, with no significant daytime sleepiness or snoring. Prefers to continue weight loss efforts before re-considering CPAP therapy. Discussed alternative treatment options including oral appliances, which are not covered by insurance. Emphasized the importance of side sleeping or using a wedge pillow to prevent supine positioning during sleep. - Continue weight loss efforts with Wegovy . - Follow up in six months to reassess sleep apnea management. - Consider CPAP therapy or oral appliance if no improvement in symptoms or weight loss. - Advise on side sleeping or use of a wedge pillow to prevent supine positioning. - Caution against alcohol consumption before bed and driving if experiencing poor sleep.  Overweight Currently on Wegovy  for weight management, recently increased to 1 mg. No diabetes, but participating in a diabetes-based program through insurance for medication coverage. Weight loss is a goal to potentially improve sleep apnea symptoms. - Continue Wegovy  at 1 mg for weight management. - Focus on weight loss as a primary goal to potentially improve sleep apnea.    Follow Up Instructions:  6 months with Beth NP or sooner if needed   I discussed the  assessment and treatment plan with the patient. The patient was provided an opportunity to ask questions and all were answered. The patient agreed with the plan and demonstrated an understanding of the instructions.   The patient was advised to call back or seek an in-person evaluation if the symptoms worsen or if the condition fails to improve as anticipated.  I provided 22 minutes of non-face-to-face time during this encounter.   Jenna LELON Ferrari, NP

## 2024-04-02 NOTE — Telephone Encounter (Signed)
 Needs follow-up in 6 months for OSA not on CPAP, can be virtual with [facility]

## 2024-04-08 ENCOUNTER — Ambulatory Visit: Admitting: Student

## 2024-04-19 ENCOUNTER — Encounter: Payer: Self-pay | Admitting: Family Medicine

## 2024-04-30 ENCOUNTER — Other Ambulatory Visit (HOSPITAL_COMMUNITY): Payer: Self-pay

## 2024-04-30 MED ORDER — WEGOVY 1.7 MG/0.75ML ~~LOC~~ SOAJ
1.7000 mg | SUBCUTANEOUS | 0 refills | Status: AC
  Filled 2024-04-30: qty 3, 28d supply, fill #0

## 2024-05-11 ENCOUNTER — Encounter: Admitting: Family Medicine

## 2024-05-19 ENCOUNTER — Ambulatory Visit: Admitting: Student

## 2024-05-19 VITALS — Wt 234.0 lb

## 2024-05-19 DIAGNOSIS — M793 Panniculitis, unspecified: Secondary | ICD-10-CM | POA: Diagnosis not present

## 2024-05-19 DIAGNOSIS — Z6841 Body Mass Index (BMI) 40.0 and over, adult: Secondary | ICD-10-CM

## 2024-05-19 NOTE — Progress Notes (Signed)
   Referring Provider Mahlon Comer BRAVO, MD [phone],  [place] [address]   CC:  Chief Complaint  Patient presents with   Follow-up      Jenna Pope is an 53 y.o. female.  HPI: Patient is a 53 year old female who presents to the clinic today for follow-up on smoking and her weight in regards to possible panniculectomy surgery.  Patient was initially seen by Dr. Lowery on 05/25/2021.  At this visit, patient reported that she had lost about 74 pounds and complained of back and neck pain at that time.  Patient reported at that time she was smoking but was willing to stop.  It was discussed with her at that time she needed to be smoke-free for at least 3 months.  Patient was then seen again by Dr. Lowery on 08/06/2022.  At this visit, her weight was 200 lbs and BMI was 39.7 kg/m.  At this visit she reported that she had quit smoking approximately 3 months prior.  Plan was for patient to continue with her weight loss and return in about 2 to 3 months.  Patient was then seen again on 01/27/2024.  At this visit, patient reported she was trying to lose weight, but was finding it very difficult.  Patient at this visit said that she was vaping.  Today, patient reports she is doing well.  She states that she has been smoke-free for about 1 month.  She states that she has been continuing to try to lose weight, but has been finding it difficult.  Her weight today is 234 pounds and her BMI is 44 kg/m.  Patient states that she would like to proceed with surgery if possible.  She does not report any other issues or concerns  Review of Systems General: Does not report any fevers or chills  Physical Exam    05/19/2024    3:58 PM 01/27/2024    1:27 PM 12/22/2023    1:30 PM  Vitals with BMI  Height  5' 1   Weight 234 lbs 229 lbs 225 lbs 2 oz  BMI  43.29   Systolic  132 120  Diastolic  84 80  Pulse  100 56    General:  No acute distress,  Alert and oriented, Non-Toxic, Normal  speech and affect Psych: Normal mood normal behavior Pulmonary: Unlabored breathing, no respiratory distress MSK: Ambulatory  Assessment/Plan  Panniculitis   I discussed with the patient that she needs to be smoke-free at least for 3 months prior to surgery.  Discussed with her to continue with smoking cessation.  I discussed with her that if we did go ahead and submit, she would not be able to have surgery for at least in the last 2 months.  She expressed understanding to this.  I also discussed with the patient that her BMI is still quite high, but I will discuss patient's BMI with Dr. Lowery and see if she would like to proceed at this time.  I did discuss with the patient that a higher BMI is associated with more perioperative complications.  Patient expressed understanding.  I encouraged her to continue to try and lose weight.  She states that she has been working with her PCP on this.     Jenna Pope 05/19/2024, 4:07 PM

## 2024-05-20 ENCOUNTER — Other Ambulatory Visit: Payer: Self-pay | Admitting: Student

## 2024-05-20 DIAGNOSIS — M793 Panniculitis, unspecified: Secondary | ICD-10-CM

## 2024-05-20 NOTE — Progress Notes (Signed)
 I spoke with Dr. Lowery about patient's visit yesterday, she is okay with patient proceeding with panniculectomy in December.

## 2024-06-21 ENCOUNTER — Encounter: Payer: Self-pay | Admitting: Plastic Surgery

## 2024-06-21 ENCOUNTER — Ambulatory Visit: Admitting: Plastic Surgery

## 2024-06-21 ENCOUNTER — Other Ambulatory Visit (HOSPITAL_BASED_OUTPATIENT_CLINIC_OR_DEPARTMENT_OTHER): Payer: Self-pay | Admitting: Obstetrics & Gynecology

## 2024-06-21 DIAGNOSIS — M793 Panniculitis, unspecified: Secondary | ICD-10-CM

## 2024-06-21 DIAGNOSIS — R21 Rash and other nonspecific skin eruption: Secondary | ICD-10-CM | POA: Diagnosis not present

## 2024-06-21 DIAGNOSIS — Z9884 Bariatric surgery status: Secondary | ICD-10-CM | POA: Diagnosis not present

## 2024-06-21 DIAGNOSIS — M542 Cervicalgia: Secondary | ICD-10-CM

## 2024-06-21 DIAGNOSIS — B372 Candidiasis of skin and nail: Secondary | ICD-10-CM

## 2024-06-21 DIAGNOSIS — G8929 Other chronic pain: Secondary | ICD-10-CM

## 2024-06-21 NOTE — Progress Notes (Signed)
   Subjective:    Patient ID: Jenna Pope, female    DOB: [birth year], 53 y.o.   MRN: [id number]  The patient is a 53 year old female joining me by phone.  She is at home and I am at the office.  She had a gastric lap band surgery done in 2016 and has been working with [employer].  She is now on Wegovy .  She has been able to decrease her weight to be under 200 which is almost 100 pound weight loss.  She quit smoking about 2 years ago.  She is still working on the weight loss.  Her current concerns is that she still has a lot of tissue breakdown's, skin irritation and rashes.  She has had multiples prescriptions for nystatin  lotion and cream.  She has been on antibiotics as well to try and keep from getting the rashes.  She is good to continue to work on her weight for the next 2 months and then come back and see us  again.  She is also going to get pictures of her skin breakdown.      Review of Systems  Constitutional: Negative.   Eyes: Negative.   Respiratory: Negative.    Cardiovascular: Negative.   Gastrointestinal: Negative.   Genitourinary: Negative.   Musculoskeletal: Negative.        Objective:   Physical Exam        Assessment & Plan:     ICD-10-CM   1. Neck pain  M54.2     2. Morbid obesity (HCC)  E66.01     3. Panniculitis  M79.3     4. Chronic bilateral thoracic back pain  M54.6    G89.29       Patient to come in and see us  in 2 to 3 months.  Pictures of skin breakdown and continue with creams and lotions to keep the skin from getting rashes or infections.  I connected with  Jenna Pope on 06/21/24 by phone and verified that I am speaking with the correct person using two identifiers.   I discussed the limitations of evaluation and management by telemedicine. The patient expressed understanding and agreed to proceed.  We spent 5 minutes in discussion.

## 2024-07-26 ENCOUNTER — Encounter: Payer: Self-pay | Admitting: Family Medicine

## 2024-07-28 ENCOUNTER — Encounter: Payer: Self-pay | Admitting: Family Medicine

## 2024-07-28 ENCOUNTER — Ambulatory Visit: Admitting: Family Medicine

## 2024-07-28 ENCOUNTER — Ambulatory Visit: Payer: Self-pay | Admitting: Family Medicine

## 2024-07-28 VITALS — BP 154/86 | HR 103 | Temp 97.8°F | Ht 61.0 in | Wt 260.2 lb

## 2024-07-28 DIAGNOSIS — I10 Essential (primary) hypertension: Secondary | ICD-10-CM

## 2024-07-28 DIAGNOSIS — R52 Pain, unspecified: Secondary | ICD-10-CM

## 2024-07-28 DIAGNOSIS — R600 Localized edema: Secondary | ICD-10-CM

## 2024-07-28 LAB — CBC WITH DIFFERENTIAL/PLATELET
Basophils Absolute: 0 K/uL (ref 0.0–0.1)
Basophils Relative: 0.6 % (ref 0.0–3.0)
Eosinophils Absolute: 0.1 K/uL (ref 0.0–0.7)
Eosinophils Relative: 1.9 % (ref 0.0–5.0)
HCT: 42.8 % (ref 36.0–46.0)
Hemoglobin: 14.5 g/dL (ref 12.0–15.0)
Lymphocytes Relative: 29.6 % (ref 12.0–46.0)
Lymphs Abs: 1.9 K/uL (ref 0.7–4.0)
MCHC: 33.8 g/dL (ref 30.0–36.0)
MCV: 88.1 fl (ref 78.0–100.0)
Monocytes Absolute: 0.5 K/uL (ref 0.1–1.0)
Monocytes Relative: 7.7 % (ref 3.0–12.0)
Neutro Abs: 3.9 K/uL (ref 1.4–7.7)
Neutrophils Relative %: 60.2 % (ref 43.0–77.0)
Platelets: 211 K/uL (ref 150.0–400.0)
RBC: 4.86 Mil/uL (ref 3.87–5.11)
RDW: 13.5 % (ref 11.5–15.5)
WBC: 6.5 K/uL (ref 4.0–10.5)

## 2024-07-28 LAB — POC COVID19 BINAXNOW: SARS Coronavirus 2 Ag: NEGATIVE

## 2024-07-28 LAB — HEPATIC FUNCTION PANEL
ALT: 21 U/L (ref 3–35)
AST: 22 U/L (ref 5–37)
Albumin: 3.7 g/dL (ref 3.5–5.2)
Alkaline Phosphatase: 80 U/L (ref 39–117)
Bilirubin, Direct: 0.1 mg/dL (ref 0.1–0.3)
Total Bilirubin: 0.5 mg/dL (ref 0.2–1.2)
Total Protein: 6.2 g/dL (ref 6.0–8.3)

## 2024-07-28 LAB — BASIC METABOLIC PANEL WITH GFR
BUN: 11 mg/dL (ref 6–23)
CO2: 28 meq/L (ref 19–32)
Calcium: 8.8 mg/dL (ref 8.4–10.5)
Chloride: 107 meq/L (ref 96–112)
Creatinine, Ser: 0.57 mg/dL (ref 0.40–1.20)
GFR: 103.87 mL/min (ref >60.00)
Glucose, Bld: 107 mg/dL — ABNORMAL HIGH (ref 70–99)
Potassium: 4.1 meq/L (ref 3.5–5.1)
Sodium: 141 meq/L (ref 135–145)

## 2024-07-28 LAB — POCT INFLUENZA A/B
Influenza A, POC: NEGATIVE
Influenza B, POC: NEGATIVE

## 2024-07-28 LAB — HEMOGLOBIN A1C: Hgb A1c MFr Bld: 5.2 % (ref 4.6–6.5)

## 2024-07-28 LAB — TSH: TSH: 4.82 u[IU]/mL (ref 0.35–5.50)

## 2024-07-28 LAB — BRAIN NATRIURETIC PEPTIDE: Pro B Natriuretic peptide (BNP): 124 pg/mL — ABNORMAL HIGH (ref 1.0–100.0)

## 2024-07-28 MED ORDER — LOSARTAN POTASSIUM 50 MG PO TABS: 50.0000 mg | ORAL_TABLET | Freq: Every day | ORAL | 3 refills | Status: DC

## 2024-07-28 NOTE — Assessment & Plan Note (Signed)
 Deteriorated.  BP is elevated today and pt has gained 40 lbs.  Will add Losartan  to the Spironolactone  she is already taking.  Will avoid Amlodipine due to LE edema.  Check labs.  Will follow.

## 2024-07-28 NOTE — Progress Notes (Signed)
° °  Subjective:    Patient ID: Jenna Pope, female    DOB: February 07, 1971, 53 y.o.   MRN: [id number]  HPI Body aches- sxs started ~1 week ago.  No fever.  + mild cough and some SOB.  Denies HA.  + sick contact- mom had flu A.  + leg swelling on R (needs R knee surgery).  Denies hand swelling.  Elevated BP- pt has gained 40 lbs and BP is elevated.  Dad had CVA in June and she had stopped taking care of herself to care for him.   Review of Systems For ROS see HPI     Objective:   Physical Exam Vitals reviewed.  Constitutional:      General: She is not in acute distress.    Appearance: Normal appearance. She is well-developed. She is obese. She is not ill-appearing.  HENT:     Head: Normocephalic and atraumatic.     Right Ear: Tympanic membrane and ear canal normal.     Left Ear: Tympanic membrane and ear canal normal.     Nose: Congestion present.     Comments: No TTP over frontal or maxillary sinuses Eyes:     Conjunctiva/sclera: Conjunctivae normal.     Pupils: Pupils are equal, round, and reactive to light.  Neck:     Thyroid : No thyromegaly.  Cardiovascular:     Rate and Rhythm: Normal rate and regular rhythm.     Heart sounds: Normal heart sounds. No murmur heard. Pulmonary:     Effort: Pulmonary effort is normal. No respiratory distress.     Breath sounds: Normal breath sounds. No wheezing or rhonchi.  Abdominal:     General: There is no distension.     Palpations: Abdomen is soft.     Tenderness: There is no abdominal tenderness.  Musculoskeletal:     Cervical back: Normal range of motion and neck supple.     Right lower leg: Edema (1-2+ pitting edema to just below knee) present.     Left lower leg: No edema.  Lymphadenopathy:     Cervical: No cervical adenopathy.  Skin:    General: Skin is warm and dry.  Neurological:     General: No focal deficit present.     Mental Status: She is alert and oriented to person, place, and time.  Psychiatric:        Mood and Affect:  Mood normal.        Behavior: Behavior normal.        Thought Content: Thought content normal.           Assessment & Plan:  Body aches- new.  Thankfully flu and COVID are negative but this could be another viral illness.  Could also be physical manifestation of grief/stress.  Check labs to assess infection/inflammation.  Supportive care prn.  LE edema- new.  R sided.  Pt has gained 40 lbs since summer which is likely contributing and she needs a R knee replacement.  Denies swelling of hands, no SOB or wheezing.  Check BNP and other labs to assess for underlying cause.  She is already on Spironolactone .  Encouraged low sodium diet, increased water, and elevating legs when sitting.  Pt expressed understanding and is in agreement w/ plan.

## 2024-07-28 NOTE — Patient Instructions (Signed)
 Follow up in 4 weeks to recheck blood pressure We'll notify you of your lab results and make any changes if needed START the Losartan  once daily for blood pressure Try and limit your salt intake to help w/ swelling Elevate your legs when possible REST when needed Call with any questions or concerns Stay Safe!  Stay Healthy! Hang in there!!!

## 2024-07-28 NOTE — Assessment & Plan Note (Signed)
 Deteriorated.  Pt gained 40 lbs since father had CVA this summer.  Has has since passed away but she reports she put her health on hold while trying to care for him.  Stressed importance of low carb diet and physical activity prn.  Check labs to risk stratify.  Will follow.

## 2024-08-24 ENCOUNTER — Encounter: Admitting: Family Medicine

## 2024-08-25 ENCOUNTER — Ambulatory Visit: Admitting: Family Medicine

## 2024-08-25 ENCOUNTER — Telehealth: Payer: Self-pay

## 2024-08-25 ENCOUNTER — Encounter: Payer: Self-pay | Admitting: Family Medicine

## 2024-08-25 VITALS — BP 128/72 | HR 83 | Ht 61.0 in | Wt 261.1 lb

## 2024-08-25 DIAGNOSIS — I10 Essential (primary) hypertension: Secondary | ICD-10-CM

## 2024-08-25 DIAGNOSIS — R600 Localized edema: Secondary | ICD-10-CM

## 2024-08-25 DIAGNOSIS — Z1211 Encounter for screening for malignant neoplasm of colon: Secondary | ICD-10-CM

## 2024-08-25 LAB — BASIC METABOLIC PANEL WITH GFR
BUN: 9 mg/dL (ref 6–23)
CO2: 28 meq/L (ref 19–32)
Calcium: 9.2 mg/dL (ref 8.4–10.5)
Chloride: 106 meq/L (ref 96–112)
Creatinine, Ser: 0.58 mg/dL (ref 0.40–1.20)
GFR: 103.38 mL/min
Glucose, Bld: 90 mg/dL (ref 70–99)
Potassium: 4.2 meq/L (ref 3.5–5.1)
Sodium: 140 meq/L (ref 135–145)

## 2024-08-25 MED ORDER — LOSARTAN POTASSIUM 100 MG PO TABS: 100.0000 mg | ORAL_TABLET | Freq: Every day | ORAL | 3 refills | Status: AC

## 2024-08-25 NOTE — Telephone Encounter (Signed)
-----   Message from Elspeth Naval, MD sent at 08/25/2024 12:33 PM EST ----- Sorry, not sure how that happened.  She is actually due September 2026, this year.  Jan can you make sure this patient has a recall for colonoscopy in September of this year?  Thanks. ----- Message ----- From: Mahlon Comer BRAVO, MD Sent: 08/25/2024  11:59 AM EST To: Elspeth SHAUNNA Naval, MD  There was a letter sent in 2024 indicating that the recall colonoscopy recommendations had changed but no new date was given.  When is she actually due so we can update her health maintenance?  Thanks!  -Mallie

## 2024-08-25 NOTE — Progress Notes (Signed)
" ° °  Subjective:    Patient ID: Jenna Pope, female    DOB: Jul 17, 1971, 54 y.o.   MRN: [id number]  HPI HTN- BP remains elevated today despite addition of Losartan  50mg  daily.  Also on Spironolactone  100mg  BID.  BP has decreased by 6 points on both top and bottom.  No CP, SOB, HA's, visual changes, edema.  Swelling is improved.  No side effects from Losartan .   Review of Systems For ROS see HPI     Objective:   Physical Exam Vitals reviewed.  Constitutional:      General: She is not in acute distress.    Appearance: Normal appearance. She is well-developed. She is not ill-appearing.  HENT:     Head: Normocephalic and atraumatic.  Eyes:     Conjunctiva/sclera: Conjunctivae normal.     Pupils: Pupils are equal, round, and reactive to light.  Neck:     Thyroid : No thyromegaly.  Cardiovascular:     Rate and Rhythm: Normal rate and regular rhythm.     Pulses: Normal pulses.     Heart sounds: Normal heart sounds. No murmur heard. Pulmonary:     Effort: Pulmonary effort is normal. No respiratory distress.     Breath sounds: Normal breath sounds.  Abdominal:     General: There is no distension.     Palpations: Abdomen is soft.     Tenderness: There is no abdominal tenderness.  Musculoskeletal:     Cervical back: Normal range of motion and neck supple.     Right lower leg: No edema.     Left lower leg: No edema.  Lymphadenopathy:     Cervical: No cervical adenopathy.  Skin:    General: Skin is warm and dry.  Neurological:     General: No focal deficit present.     Mental Status: She is alert and oriented to person, place, and time.  Psychiatric:        Mood and Affect: Mood normal.        Behavior: Behavior normal.           Assessment & Plan:    "

## 2024-08-25 NOTE — Telephone Encounter (Signed)
 Colon recall in place for 04-2025

## 2024-08-25 NOTE — Assessment & Plan Note (Signed)
 Ongoing issue.  BP was still elevated upon arrival but decreased to normal range prior to leaving.  She reports very high stress at home recently and is aware that BP has still been running high.  Based on this, will increase Losartan  to 100mg  daily and follow.  Check BMP due to addition of ARB.  Pt expressed understanding and is in agreement w/ plan.

## 2024-08-25 NOTE — Assessment & Plan Note (Signed)
 Ongoing issue.  BMI 49.34.  will follow.

## 2024-08-25 NOTE — Patient Instructions (Signed)
 Follow up in 1 month to recheck blood pressure We'll notify you of your lab results and make any changes if needed INCREASE the Losartan  to 100mg  daily- 2 of what you have at home and 1 of the new prescription Continue to drink plenty of fluids and limit salt to improve blood pressure Call with any questions or concerns Stay Safe! Stay Healthy! Hang in there!

## 2024-08-26 ENCOUNTER — Ambulatory Visit: Payer: Self-pay | Admitting: Family Medicine

## 2024-08-28 ENCOUNTER — Other Ambulatory Visit (HOSPITAL_COMMUNITY): Payer: Self-pay

## 2024-08-28 MED ORDER — WEGOVY 1.5 MG PO TABS
1.5000 mg | ORAL_TABLET | Freq: Every day | ORAL | 0 refills | Status: AC
  Filled 2024-08-28: qty 30, 30d supply, fill #0

## 2024-08-30 ENCOUNTER — Other Ambulatory Visit: Payer: Self-pay

## 2024-09-15 ENCOUNTER — Other Ambulatory Visit (HOSPITAL_COMMUNITY): Payer: Self-pay | Admitting: Orthopedic Surgery

## 2024-09-15 DIAGNOSIS — R600 Localized edema: Secondary | ICD-10-CM

## 2024-09-16 ENCOUNTER — Ambulatory Visit (HOSPITAL_COMMUNITY): Admission: RE | Admit: 2024-09-16 | Discharge: 2024-09-16 | Attending: Surgery | Admitting: Surgery

## 2024-09-16 DIAGNOSIS — R600 Localized edema: Secondary | ICD-10-CM

## 2024-09-22 ENCOUNTER — Ambulatory Visit: Admitting: Family Medicine
